# Patient Record
Sex: Female | Born: 1980 | Race: White | Hispanic: No | Marital: Married | Smoking: Former smoker
Health system: Southern US, Community
[De-identification: ages and names within clinical notes are randomized; demographics above are authoritative.]

## PROBLEM LIST (undated history)

## (undated) DIAGNOSIS — R790 Abnormal level of blood mineral: Secondary | ICD-10-CM

## (undated) DIAGNOSIS — R200 Anesthesia of skin: Secondary | ICD-10-CM

## (undated) DIAGNOSIS — K219 Gastro-esophageal reflux disease without esophagitis: Secondary | ICD-10-CM

## (undated) DIAGNOSIS — Z9889 Other specified postprocedural states: Secondary | ICD-10-CM

## (undated) DIAGNOSIS — F419 Anxiety disorder, unspecified: Secondary | ICD-10-CM

## (undated) DIAGNOSIS — K449 Diaphragmatic hernia without obstruction or gangrene: Secondary | ICD-10-CM

## (undated) DIAGNOSIS — R112 Nausea with vomiting, unspecified: Secondary | ICD-10-CM

## (undated) DIAGNOSIS — Z79810 Long term (current) use of selective estrogen receptor modulators (SERMs): Secondary | ICD-10-CM

## (undated) DIAGNOSIS — D249 Benign neoplasm of unspecified breast: Secondary | ICD-10-CM

## (undated) DIAGNOSIS — D649 Anemia, unspecified: Secondary | ICD-10-CM

## (undated) DIAGNOSIS — E538 Deficiency of other specified B group vitamins: Secondary | ICD-10-CM

## (undated) DIAGNOSIS — K8020 Calculus of gallbladder without cholecystitis without obstruction: Secondary | ICD-10-CM

## (undated) DIAGNOSIS — R768 Other specified abnormal immunological findings in serum: Secondary | ICD-10-CM

## (undated) DIAGNOSIS — Z789 Other specified health status: Secondary | ICD-10-CM

## (undated) DIAGNOSIS — K429 Umbilical hernia without obstruction or gangrene: Secondary | ICD-10-CM

## (undated) DIAGNOSIS — D369 Benign neoplasm, unspecified site: Secondary | ICD-10-CM

## (undated) DIAGNOSIS — R202 Paresthesia of skin: Secondary | ICD-10-CM

## (undated) HISTORY — PX: HERNIA REPAIR: SHX51

## (undated) HISTORY — PX: TUBAL LIGATION: SHX77

---
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
## 2012-03-14 HISTORY — PX: HERNIA REPAIR: SHX51

## 2016-03-14 HISTORY — PX: CHOLECYSTECTOMY: SHX55

## 2016-08-02 DIAGNOSIS — K8020 Calculus of gallbladder without cholecystitis without obstruction: Secondary | ICD-10-CM | POA: Insufficient documentation

## 2017-10-06 DIAGNOSIS — N83209 Unspecified ovarian cyst, unspecified side: Secondary | ICD-10-CM | POA: Diagnosis not present

## 2017-10-06 DIAGNOSIS — F329 Major depressive disorder, single episode, unspecified: Secondary | ICD-10-CM | POA: Diagnosis not present

## 2017-10-06 DIAGNOSIS — Z87891 Personal history of nicotine dependence: Secondary | ICD-10-CM | POA: Diagnosis not present

## 2017-10-06 DIAGNOSIS — R1010 Upper abdominal pain, unspecified: Secondary | ICD-10-CM | POA: Diagnosis not present

## 2017-10-06 DIAGNOSIS — Z9049 Acquired absence of other specified parts of digestive tract: Secondary | ICD-10-CM | POA: Diagnosis not present

## 2017-10-06 DIAGNOSIS — R1013 Epigastric pain: Secondary | ICD-10-CM | POA: Diagnosis not present

## 2017-10-10 DIAGNOSIS — R109 Unspecified abdominal pain: Secondary | ICD-10-CM | POA: Diagnosis not present

## 2017-10-10 DIAGNOSIS — Z6824 Body mass index (BMI) 24.0-24.9, adult: Secondary | ICD-10-CM | POA: Diagnosis not present

## 2018-01-07 ENCOUNTER — Other Ambulatory Visit: Payer: Self-pay

## 2018-01-07 ENCOUNTER — Ambulatory Visit
Admission: EM | Admit: 2018-01-07 | Discharge: 2018-01-07 | Disposition: A | Discharge status: Home / Self Care | Payer: 59 | Attending: Emergency Medicine | Admitting: Emergency Medicine

## 2018-01-07 DIAGNOSIS — R5383 Other fatigue: Secondary | ICD-10-CM | POA: Diagnosis not present

## 2018-01-07 DIAGNOSIS — R110 Nausea: Secondary | ICD-10-CM

## 2018-01-07 LAB — CBC WITH DIFFERENTIAL/PLATELET
Abs Immature Granulocytes: 0.01 10*3/uL (ref 0.00–0.07)
BASOS PCT: 1 %
Basophils Absolute: 0 10*3/uL (ref 0.0–0.1)
EOS PCT: 2 %
Eosinophils Absolute: 0.1 10*3/uL (ref 0.0–0.5)
HCT: 37.4 % (ref 36.0–46.0)
Hemoglobin: 12.2 g/dL (ref 12.0–15.0)
Immature Granulocytes: 0 %
LYMPHS PCT: 33 %
Lymphs Abs: 1.7 10*3/uL (ref 0.7–4.0)
MCH: 28.4 pg (ref 26.0–34.0)
MCHC: 32.6 g/dL (ref 30.0–36.0)
MCV: 87 fL (ref 80.0–100.0)
MONO ABS: 0.4 10*3/uL (ref 0.1–1.0)
Monocytes Relative: 8 %
NEUTROS ABS: 3 10*3/uL (ref 1.7–7.7)
Neutrophils Relative %: 56 %
Platelets: 239 10*3/uL (ref 150–400)
RBC: 4.3 MIL/uL (ref 3.87–5.11)
RDW: 13.9 % (ref 11.5–15.5)
WBC: 5.3 10*3/uL (ref 4.0–10.5)
nRBC: 0 % (ref 0.0–0.2)

## 2018-01-07 LAB — COMPREHENSIVE METABOLIC PANEL
ALT: 9 U/L (ref 0–44)
ANION GAP: 10 (ref 5–15)
AST: 15 U/L (ref 15–41)
Albumin: 4.3 g/dL (ref 3.5–5.0)
Alkaline Phosphatase: 47 U/L (ref 38–126)
BUN: 16 mg/dL (ref 6–20)
CO2: 24 mmol/L (ref 22–32)
Calcium: 9.3 mg/dL (ref 8.9–10.3)
Chloride: 108 mmol/L (ref 98–111)
Creatinine, Ser: 0.71 mg/dL (ref 0.44–1.00)
Glucose, Bld: 101 mg/dL — ABNORMAL HIGH (ref 70–99)
POTASSIUM: 4.8 mmol/L (ref 3.5–5.1)
Sodium: 142 mmol/L (ref 135–145)
TOTAL PROTEIN: 7.1 g/dL (ref 6.5–8.1)
Total Bilirubin: 0.2 mg/dL — ABNORMAL LOW (ref 0.3–1.2)

## 2018-01-07 NOTE — Discharge Instructions (Signed)
Your kidney function, electrolyte levels, liver functions were all normal.  Your hemoglobin is about the same as it was when you had it tested back in June 2019.  You are not anemic.  Your platelets are fine.  Your TSH is pending, we will call you if it comes back abnormal.  If you do not hear from [place], then assume that it is normal.  You need to follow-up with a primary care physician as soon as you possibly can to have this further evaluated.  See list below.  There does not appear to be a life-threatening emergency today.   Here is a list of primary care providers who are taking new patients:  Dr. Otilio Miu, Dr. Adline Potter [address] [place] [address] [place] [address]  [phone]  [facility] [address] [place], [place] [address] [phone]  [facility] [employer]  [phone] [place], [place] [address]  Here are clinics/ other resources who will see you if you do not have insurance. Some have certain criteria that you must meet. Call them and find out what they are:  [facility]: [address] [place], [place] [address] Phone: [phone] Hours: First and Third Saturdays of each Month, 9 a.m. - 1 p.m.  [facility]: [address], [place], [place], [place] [address] Phone: [phone] Hours: Tuesday, 4 p.m. - 8 p.m. Thursday, 1 p.m. - 8 p.m. Wednesday, 9 a.m. - [facility] [address], [place], [place] [address] Phone: [phone] Pharmacy Phone Number: [phone] Dental Phone Number: [phone] [place] Help: [phone]  Dental Hours: Monday - Thursday, 8 a.m. - 6 p.m.  [place] [address], [place], [place] [address] Phone: [phone] Pharmacy Phone Number: [phone] [facility] Insurance Help: [phone]  [facility] [place]  [place], [place] [address] Phone: [phone] Pharmacy Phone Number: [phone] [facility] Insurance Help: [phone]  [facility] [address] [place], [place] [address] Phone: [phone] [facility] Insurance Help: [phone]   [place] [place], [place] [address] Phone: [phone]  Go to www.goodrx.com to look up your medications. This will give you a list of where you can find your prescriptions at the most affordable prices. Or ask the pharmacist what the cash price is, or if they have any other discount programs available to help make your medication more affordable. This can be less expensive than what you would pay with insurance.

## 2018-01-07 NOTE — ED Provider Notes (Signed)
HPI  SUBJECTIVE:  Lauren Boyd is a 36 y.o. female who presents with daily, intermittent nausea for the past 10 years.  She is reporting several episodes a day.  She reports 2 months of fatigue, decreased energy, bruising easily.  She reports a 10 pound unintentional weight loss over the past 5 months and states that she is eating normally.  She states that she gets full easily.  Her symptoms are not any different today, but she wants to know "what is going on".  She reports heavier periods than normal over the past year.  Reports shortness of breath with exertion.  No vomiting, fevers, night sweats, epistaxis, hematuria.  No melena.  No coughing, wheezing.  No change in her stool habits.  No abdominal pain.  No history of IV drug use, blood transfusion, transplants.  She is a monogamous sexual relationship with her wife.  There are no aggravating or alleviating factors.  She has not tried anything for this.  She is a former smoker, she is status post cholecystectomy.  No history of anemia, coagulopathy, cancer, hyper or hypothyroidism.  Family history significant for maternal grandmother with colon, breast cancer, maternal grandfather with throat cancer.  LMP: Now.  Denies possibility of being pregnant.  PMD: None.  States that she just moved here.  History reviewed. No pertinent past medical history.  Past Surgical History:  Procedure Laterality Date  . HERNIA REPAIR    . TUBAL LIGATION      Family History  Problem Relation Age of Onset  . Healthy Mother   . Healthy Father     Social History   Tobacco Use  . Smoking status: Former Smoker    Types: Cigarettes  . Smokeless tobacco: Never Used  Substance Use Topics  . Alcohol use: Not Currently  . Drug use: Not Currently    No current facility-administered medications for this encounter.  No current outpatient medications on file.  No Known Allergies   ROS  As noted in HPI.   Physical Exam  BP 133/84 (BP Location: Left  Arm)   Pulse 62   Temp 98.2 F (36.8 C) (Oral)   Resp 16   Ht 5\' 4"  (1.626 m)   Wt 59.9 kg   LMP 12/28/2017   SpO2 100%   BMI 22.66 kg/m   Constitutional: Well developed, well nourished, no acute distress Eyes: PERRL, EOMI, conjunctiva normal bilaterally HENT: Normocephalic, atraumatic,mucus membranes moist Respiratory: Clear to auscultation bilaterally, no rales, no wheezing, no rhonchi Cardiovascular: Normal rate and rhythm, no murmurs, no gallops, no rubs GI: Soft, nondistended, normal bowel sounds, nontender, no rebound, no guarding.  No masses. Back: no CVAT skin: No rash, skin intact Musculoskeletal: No edema, no tenderness, no deformities Neurologic: Alert & oriented x 3, CN II-XII grossly intact, no motor deficits, sensation grossly intact Psychiatric: Speech and behavior appropriate   ED Course   Medications - No data to display  Orders Placed This Encounter  Procedures  . CBC with Differential    Standing Status:   Standing    Number of Occurrences:   1  . Comprehensive metabolic panel    Standing Status:   Standing    Number of Occurrences:   1   No results found for this or any previous visit (from the past 24 hour(s)). No results found.  ED Clinical Impression  Nausea  Fatigue, unspecified type   ED Assessment/Plan   CBC, CMP, TSH.  Deferring HIV as patient has no risk factors for  this.  Will provide a primary care referral list for further work-up.  No evidence of emergency today.  Labs reviewed.  CMP, CBC normal.  TSH pending.  Plan as above.  Outside labs from [facility] reviewed.  Today's hemoglobin stable from last hemoglobin of 12.5 on 10/06/2017.  No record of TSH found.  10/29 1637- TSH resulted and reviewed. Normal.  No orders of the defined types were placed in this encounter.   *This clinic note was created using Dragon dictation software. Therefore, there may be occasional mistakes despite careful proofreading.  ?   Melynda Ripple, MD 01/09/18 [id number]

## 2018-01-07 NOTE — ED Triage Notes (Addendum)
Pt states she hasn't been feeling well for past several months. Reports intermittent nausea x past 10 years. Becoming more frequent, decreased appetite with 10lb weight loss over past couple months. Feels tired. Feels like she is bruising more easily.  No PCP

## 2018-01-08 LAB — TSH: TSH: 1.144 u[IU]/mL (ref 0.350–4.500)

## 2018-08-20 ENCOUNTER — Encounter: Payer: Self-pay | Admitting: Family Medicine

## 2018-08-20 ENCOUNTER — Ambulatory Visit: Payer: 59 | Admitting: Family Medicine

## 2018-08-20 ENCOUNTER — Other Ambulatory Visit: Payer: Self-pay

## 2018-08-20 VITALS — BP 120/80 | HR 60 | Ht 64.0 in | Wt 142.0 lb

## 2018-08-20 DIAGNOSIS — R1013 Epigastric pain: Secondary | ICD-10-CM | POA: Diagnosis not present

## 2018-08-20 DIAGNOSIS — Z7689 Persons encountering health services in other specified circumstances: Secondary | ICD-10-CM

## 2018-08-20 DIAGNOSIS — N946 Dysmenorrhea, unspecified: Secondary | ICD-10-CM

## 2018-08-20 NOTE — Progress Notes (Signed)
Date:  08/20/2018   Name:  Lauren Boyd   DOB:  Oct 01, 1980   MRN:  [id number]   Chief Complaint: Establish Care (moved here) and Nausea (has a "sick feeling" that comes and goes along with a pain that is at the bottom of the sternum and goes through to her back and across the back)  Patient is a 38 year old female who presents for an establishment of care exam. The patient reports the following problems: episodic nausea/intermitant midepigastric pain/ dysmenorrhea. Health maintenance has been reviewed up the date.   Review of Systems  Constitutional: Positive for fatigue. Negative for chills, fever and unexpected weight change.  HENT: Negative for congestion, ear discharge, ear pain, rhinorrhea, sinus pressure, sneezing and sore throat.   Eyes: Negative for photophobia, pain, discharge, redness and itching.  Respiratory: Negative for cough, shortness of breath, wheezing and stridor.   Gastrointestinal: Positive for abdominal pain. Negative for blood in stool, constipation, diarrhea, nausea and vomiting.  Endocrine: Negative for cold intolerance, heat intolerance, polydipsia, polyphagia and polyuria.  Genitourinary: Positive for menstrual problem. Negative for dysuria, flank pain, frequency, hematuria, pelvic pain, urgency, vaginal bleeding and vaginal discharge.  Musculoskeletal: Negative for arthralgias, back pain and myalgias.  Skin: Negative for rash.  Allergic/Immunologic: Negative for environmental allergies and food allergies.  Neurological: Negative for dizziness, weakness, light-headedness, numbness and headaches.  Hematological: Negative for adenopathy. Does not bruise/bleed easily.  Psychiatric/Behavioral: Negative for dysphoric mood. The patient is not nervous/anxious.     There are no active problems to display for this patient.   No Known Allergies  Past Surgical History:  Procedure Laterality Date  . CHOLECYSTECTOMY  2018  . HERNIA REPAIR    . TUBAL LIGATION      Social History   Tobacco Use  . Smoking status: Former Smoker    Types: Cigarettes  . Smokeless tobacco: Never Used  Substance Use Topics  . Alcohol use: Yes    Comment: occasional  . Drug use: Not Currently     Medication list has been reviewed and updated.  Current Meds  Medication Sig  . ibuprofen (ADVIL) 200 MG tablet Take 200 mg by mouth every 6 (six) hours as needed.    PHQ 2/9 Scores 08/20/2018  PHQ - 2 Score 0  PHQ- 9 Score 0    BP Readings from Last 3 Encounters:  08/20/18 120/80  01/07/18 133/84    Physical Exam Vitals signs and nursing note reviewed.  Constitutional:      Appearance: She is well-developed and normal weight.  HENT:     Head: Normocephalic.     Right Ear: Tympanic membrane, ear canal and external ear normal.     Left Ear: Tympanic membrane, ear canal and external ear normal.     Nose: Nose normal.  Eyes:     General: Lids are everted, no foreign bodies appreciated. No scleral icterus.       Left eye: No foreign body or hordeolum.     Conjunctiva/sclera: Conjunctivae normal.     Right eye: Right conjunctiva is not injected.     Left eye: Left conjunctiva is not injected.     Pupils: Pupils are equal, round, and reactive to light.  Neck:     Musculoskeletal: Normal range of motion and neck supple.     Thyroid: No thyromegaly.     Vascular: No JVD.     Trachea: No tracheal deviation.  Cardiovascular:     Rate and Rhythm: Normal rate  and regular rhythm.     Heart sounds: Normal heart sounds. No murmur. No friction rub. No gallop.   Pulmonary:     Effort: Pulmonary effort is normal. No respiratory distress.     Breath sounds: Normal breath sounds. No wheezing or rales.  Abdominal:     General: Bowel sounds are normal.     Palpations: Abdomen is soft. There is no mass.     Tenderness: There is no abdominal tenderness. There is no guarding or rebound.  Musculoskeletal: Normal range of motion.        General: No tenderness.   Lymphadenopathy:     Cervical: No cervical adenopathy.  Skin:    General: Skin is warm.     Findings: No rash.  Neurological:     Mental Status: She is alert and oriented to person, place, and time.     Cranial Nerves: No cranial nerve deficit.     Deep Tendon Reflexes: Reflexes normal.  Psychiatric:        Mood and Affect: Mood is not anxious or depressed.     Wt Readings from Last 3 Encounters:  08/20/18 142 lb (64.4 kg)  01/07/18 132 lb (59.9 kg)    BP 120/80   Pulse 60   Ht 5\' 4"  (1.626 m)   Wt 142 lb (64.4 kg)   LMP 07/27/2018 (Approximate)   BMI 24.37 kg/m   Assessment and Plan:  1. Establishing care with new doctor, encounter for Patient's previous records were all reviewed for previous visits encounters, labs, CT scan in 2019, and clinic notes from [facility].  Review of systems was essentially negative septa is noted below.  Patient will be established with GYN because of previous history of pelvic inflammatory disease and possible ruptured cyst that was noted in the past.  2. Midepigastric pain And has had off-and-on nausea for almost 10 years but the midepigastric pain was not relieved when she had cholecystectomy.  She continues to have episodic mid abdominal pain that radiates to the back it is associated with nausea.  Reviewed previous labs and CT results.  Will check hepatic function panel, lipase, and H. pylori.  Next step to consider if this is to continued and patient will notify us will be referral to strain neurology. - Hepatic Function Panel (6) - Lipase - H. pylori antibody, IgG  3. Dysmenorrhea Patient has had episodes of heavy periods with significant clotting and bleeding.  The patient is reluctant to go on OCPs we discussed this and she is going to give further consideration future evaluation.  Will refer to [facility] for further evaluation and and patient is due for routine pathology.  As with patient possibility of having a change HPV testing on  next exam. - Ambulatory referral to Obstetrics / Gynecology

## 2018-08-21 ENCOUNTER — Ambulatory Visit (INDEPENDENT_AMBULATORY_CARE_PROVIDER_SITE_OTHER): Payer: 59

## 2018-08-21 DIAGNOSIS — R1013 Epigastric pain: Secondary | ICD-10-CM | POA: Diagnosis not present

## 2018-08-21 LAB — HEPATIC FUNCTION PANEL (6)
ALT: 11 IU/L (ref 0–32)
AST: 15 IU/L (ref 0–40)
Albumin: 4.5 g/dL (ref 3.8–4.8)
Alkaline Phosphatase: 58 IU/L (ref 39–117)
Bilirubin Total: 0.3 mg/dL (ref 0.0–1.2)
Bilirubin, Direct: 0.1 mg/dL (ref 0.00–0.40)

## 2018-08-21 LAB — HEMOCCULT GUIAC POC 1CARD (OFFICE)
Card #2 Fecal Occult Blod, POC: NEGATIVE
Card #3 Fecal Occult Blood, POC: NEGATIVE
Fecal Occult Blood, POC: NEGATIVE

## 2018-08-21 LAB — LIPASE: Lipase: 40 U/L (ref 14–72)

## 2018-08-21 LAB — H. PYLORI ANTIBODY, IGG: H. pylori, IgG AbS: 0.24 Index Value (ref 0.00–0.79)

## 2018-08-22 ENCOUNTER — Other Ambulatory Visit: Payer: Self-pay

## 2018-08-22 DIAGNOSIS — R1013 Epigastric pain: Secondary | ICD-10-CM

## 2018-08-22 NOTE — Progress Notes (Unsigned)
Ref to gastro placed

## 2018-08-29 ENCOUNTER — Other Ambulatory Visit: Payer: Self-pay

## 2018-08-29 ENCOUNTER — Encounter: Payer: Self-pay | Admitting: Obstetrics and Gynecology

## 2018-08-29 ENCOUNTER — Ambulatory Visit: Payer: 59 | Admitting: Obstetrics and Gynecology

## 2018-08-29 ENCOUNTER — Other Ambulatory Visit (HOSPITAL_COMMUNITY)
Admission: RE | Admit: 2018-08-29 | Discharge: 2018-08-29 | Disposition: A | Discharge status: Home / Self Care | Payer: 59 | Source: Ambulatory Visit | Attending: Obstetrics and Gynecology | Admitting: Obstetrics and Gynecology

## 2018-08-29 VITALS — BP 125/75 | HR 76 | Ht 64.0 in | Wt 142.0 lb

## 2018-08-29 DIAGNOSIS — Z124 Encounter for screening for malignant neoplasm of cervix: Secondary | ICD-10-CM | POA: Diagnosis present

## 2018-08-29 DIAGNOSIS — Z01419 Encounter for gynecological examination (general) (routine) without abnormal findings: Secondary | ICD-10-CM

## 2018-08-29 DIAGNOSIS — Z1331 Encounter for screening for depression: Secondary | ICD-10-CM

## 2018-08-29 DIAGNOSIS — Z1339 Encounter for screening examination for other mental health and behavioral disorders: Secondary | ICD-10-CM

## 2018-08-29 NOTE — Progress Notes (Signed)
Obstetrics & Gynecology Office Visit   Chief Complaint:  Chief Complaint  Patient presents with  . Dysmenorrhea    Heavy bleeding with clots Referrd by Otilio Miu MD  Referral by Dr. Otilio Miu, MD, of [facility] for heavy menstrual bleeding and an annual physical examination.   History of Present Illness:  Ms. Danahi Reddish is a 38 y.o. [phone] (she had a daughter who passed from an accident) who LMP was Patient's last menstrual period was 08/23/2018 (exact date)., presents today for her annual examination.  Her menses are regular every 28-30 days, lasting 4-7 day(s).  Dysmenorrhea none. She does not have intermenstrual bleeding.  She passes clots the first few days of her periods.  This has been going on for a few years.  She is sexually active with her same-sex wife.  Last Pap: 03/2015  Results were: no abnormalities /neg HPV DNA negative (per patient report) Hx of STDs: none  There is a FH of breast cancer in her maternal grandmother. There is no FH of ovarian cancer. The patient does not do self-breast exams.  Tobacco use: former smoker Alcohol use: social drinker Exercise: moderately active  The patient wears seatbelts: yes.   The patient reports that domestic violence in her life is absent.   Past Medical History:  Diagnosis Date  . No known health problems     Past Surgical History:  Procedure Laterality Date  . CHOLECYSTECTOMY  2018  . HERNIA REPAIR    . TUBAL LIGATION      Prior to Admission medications   Medication Sig Start Date End Date Taking? Authorizing Provider  ibuprofen (ADVIL) 200 MG tablet Take 200 mg by mouth every 6 (six) hours as needed.   Yes [provider]   Allergies: No Known Allergies  Obstetric History: [id number]  Social History   Socioeconomic History  . Marital status: Married    Spouse name: Not on file  . Number of children: Not on file  . Years of education: Not on file  . Highest education level: Not on  file  Occupational History  . Not on file  Social Needs  . Financial resource strain: Not on file  . Food insecurity    Worry: Not on file    Inability: Not on file  . Transportation needs    Medical: Not on file    Non-medical: Not on file  Tobacco Use  . Smoking status: Former Smoker    Types: Cigarettes  . Smokeless tobacco: Never Used  Substance and Sexual Activity  . Alcohol use: Yes    Comment: occasional  . Drug use: Not Currently  . Sexual activity: Yes    Birth control/protection: None, Surgical    Comment: Tubal ligation  Lifestyle  . Physical activity    Days per week: Not on file    Minutes per session: Not on file  . Stress: Not on file  Relationships  . Social Herbalist on phone: Not on file    Gets together: Not on file    Attends religious service: Not on file    Active member of club or organization: Not on file    Attends meetings of clubs or organizations: Not on file    Relationship status: Not on file  . Intimate partner violence    Fear of current or ex partner: Not on file    Emotionally abused: Not on file    Physically abused: Not on file  Forced sexual activity: Not on file  Other Topics Concern  . Not on file  Social History Narrative  . Not on file    Family History  Problem Relation Age of Onset  . Healthy Mother   . Healthy Father   . Breast cancer Maternal Grandmother 70    Review of Systems  Constitutional: Negative.   HENT: Negative.   Eyes: Negative.   Respiratory: Negative.   Cardiovascular: Negative.   Gastrointestinal: Negative.   Genitourinary: Negative.   Musculoskeletal: Negative.   Skin: Negative.   Neurological: Negative.   Psychiatric/Behavioral: Negative.      Physical Exam BP 125/75 (BP Location: Left Arm, Patient Position: Sitting, Cuff Size: Normal)   Pulse 76   Ht 5\' 4"  (1.626 m)   Wt 142 lb (64.4 kg)   LMP 08/23/2018 (Exact Date)   BMI 24.37 kg/m    Physical Exam Constitutional:       General: She is not in acute distress.    Appearance: Normal appearance. She is well-developed.  Genitourinary:     Pelvic exam was performed with patient supine.     Vulva, urethra, bladder and uterus normal.     No inguinal adenopathy present in the right or left side.    No signs of injury in the vagina.     No vaginal discharge, erythema, tenderness or bleeding.     No cervical motion tenderness, discharge, lesion or polyp.     Uterus is mobile.     Uterus is not enlarged or tender.     No uterine mass detected.    Uterus is anteverted.     No right or left adnexal mass present.     Right adnexa not tender or full.     Left adnexa not tender or full.  HENT:     Head: Normocephalic and atraumatic.  Eyes:     General: No scleral icterus.    Conjunctiva/sclera: Conjunctivae normal.  Neck:     Musculoskeletal: Normal range of motion and neck supple.     Thyroid: No thyromegaly.  Cardiovascular:     Rate and Rhythm: Normal rate and regular rhythm.     Heart sounds: No murmur. No friction rub. No gallop.   Pulmonary:     Effort: Pulmonary effort is normal. No respiratory distress.     Breath sounds: Normal breath sounds. No wheezing or rales.  Chest:     Breasts:        Right: No inverted nipple, mass, nipple discharge, skin change or tenderness.        Left: No inverted nipple, mass, nipple discharge, skin change or tenderness.  Abdominal:     General: Bowel sounds are normal. There is no distension.     Palpations: Abdomen is soft. There is no mass.     Tenderness: There is no abdominal tenderness. There is no guarding or rebound.  Musculoskeletal: Normal range of motion.        General: No swelling or tenderness.  Lymphadenopathy:     Cervical: No cervical adenopathy.     Lower Body: No right inguinal adenopathy. No left inguinal adenopathy.  Neurological:     General: No focal deficit present.     Mental Status: She is alert and oriented to person, place, and time.      Cranial Nerves: No cranial nerve deficit.  Skin:    General: Skin is warm and dry.     Findings: No erythema or rash.  Psychiatric:  Mood and Affect: Mood normal.        Behavior: Behavior normal.        Judgment: Judgment normal.     Female chaperone present for pelvic and breast  portions of the physical exam  Results: AUDIT Questionnaire (screen for alcoholism): 2 PHQ-9: 1   Assessment: 38 y.o. G90P0010 female here for routine annual gynecologic examination  Plan: Problem List Items Addressed This Visit    None    Visit Diagnoses    Women's annual routine gynecological examination    -  Primary   Relevant Orders   Cytology - PAP   Screening for depression       Screening for alcoholism       Pap smear for cervical cancer screening       Relevant Orders   Cytology - PAP      Screening: -- Blood pressure screen normal -- Weight screening: normal -- Depression screening negative (PHQ-9) -- Nutrition: normal -- cholesterol screening: not due for screening -- osteoporosis screening: not due -- tobacco screening: not using -- alcohol screening: AUDIT questionnaire indicates low-risk usage. -- family history of breast cancer screening: done. not at high risk. -- no evidence of domestic violence or intimate partner violence. -- STD screening: gonorrhea/chlamydia NAAT not collected per patient request. -- pap smear collected per ASCCP guidelines  States she is OK with her menses at the current time. She does not wish any treatment. She may follow up, PRN.  Follow up for annual exam in one year.   Prentice Docker, MD 08/29/2018 10:23 AM    CC: Juline Patch, MD [address] [place],  [place] [address]

## 2018-08-30 LAB — CYTOLOGY - PAP
Diagnosis: NEGATIVE
HPV: NOT DETECTED

## 2018-09-12 ENCOUNTER — Other Ambulatory Visit: Payer: Self-pay

## 2018-09-12 ENCOUNTER — Ambulatory Visit (INDEPENDENT_AMBULATORY_CARE_PROVIDER_SITE_OTHER): Payer: 59 | Admitting: Gastroenterology

## 2018-09-12 ENCOUNTER — Encounter: Payer: Self-pay | Admitting: Gastroenterology

## 2018-09-12 DIAGNOSIS — R1013 Epigastric pain: Secondary | ICD-10-CM | POA: Diagnosis not present

## 2018-09-12 MED ORDER — DEXILANT 60 MG PO CPDR: 60.0000 mg | DELAYED_RELEASE_CAPSULE | Freq: Every day | ORAL | 0 refills | Status: DC

## 2018-09-12 NOTE — Progress Notes (Signed)
Lauren Lame, MD [address]  [place]  [place], [place] [address]  Main: [phone]  Fax: [phone]    Gastroenterology Virtual/Video Visit  Referring Provider:     Juline Patch, MD Primary Care Physician:  Juline Patch, MD Primary Gastroenterologist:  Dr.Stanly Si Allen Norris Reason for Consultation:     Dyspepsia        HPI:    Virtual Visit via Video Note [place] Location of provider: Office  Participating persons: The patient myself and Ginger Feldpausch.  I connected with Alecia Lemming on 09/12/18 at  9:30 AM EDT by a video enabled telemedicine application and verified that I am speaking with the correct person using two identifiers.   I discussed the limitations of evaluation and management by telemedicine and the availability of in person appointments. The patient expressed understanding and agreed to proceed.  Verbal consent to proceed obtained.  History of Present Illness: Lauren Boyd is a 38 y.o. female referred by Dr. Juline Patch, MD  for consultation & management of Pepcid.  The patient reports that she has had waves of nausea.  The patient reports that she has had waves of nausea.  The patient reports that there is no association with eating drinking or bowel movements.  She also reports that the pain does not wake her up when she is sound asleep.  The patient also denies any unexplained weight loss fevers chills or vomiting.  She states that when she gets the pain the symptoms lasts for a few minutes and will also get better if she lays down.  There is no family history of colon cancer or colon polyps in any first-degree relatives although she does report she had a grandparent with colon cancer.  She reports that the dyspepsia manifests itself as a nausea feeling that comes in waves.  There is no dizziness or lightheadedness associated with the symptoms.  Past Medical History:  Diagnosis Date  . No known  health problems     Past Surgical History:  Procedure Laterality Date  . CHOLECYSTECTOMY  2018  . HERNIA REPAIR    . TUBAL LIGATION      Prior to Admission medications   Medication Sig Start Date End Date Taking? Authorizing Provider  ibuprofen (ADVIL) 200 MG tablet Take 200 mg by mouth every 6 (six) hours as needed.    [provider]    Family History  Problem Relation Age of Onset  . Healthy Mother   . Healthy Father   . Breast cancer Maternal Grandmother 70     Social History   Tobacco Use  . Smoking status: Former Smoker    Types: Cigarettes  . Smokeless tobacco: Never Used  Substance Use Topics  . Alcohol use: Yes    Comment: occasional  . Drug use: Not Currently    Allergies as of 09/12/2018  . (No Known Allergies)    Review of Systems:    All systems reviewed and negative except where noted in HPI.   Observations/Objective:  Labs: CBC    Component Value Date/Time   WBC 5.3 01/07/2018 1214   RBC 4.30 01/07/2018 1214   HGB 12.2 01/07/2018 1214   HCT 37.4 01/07/2018 1214   PLT 239 01/07/2018 1214   MCV 87.0 01/07/2018 1214   MCH 28.4 01/07/2018 1214   MCHC 32.6 01/07/2018 1214   RDW 13.9 01/07/2018 1214   LYMPHSABS 1.7 01/07/2018 1214   MONOABS 0.4  01/07/2018 1214   EOSABS 0.1 01/07/2018 1214   BASOSABS 0.0 01/07/2018 1214   CMP     Component Value Date/Time   NA 142 01/07/2018 1214   K 4.8 01/07/2018 1214   CL 108 01/07/2018 1214   CO2 24 01/07/2018 1214   GLUCOSE 101 (H) 01/07/2018 1214   BUN 16 01/07/2018 1214   CREATININE 0.71 01/07/2018 1214   CALCIUM 9.3 01/07/2018 1214   PROT 7.1 01/07/2018 1214   ALBUMIN 4.5 08/20/2018 1013   AST 15 08/20/2018 1013   ALT 11 08/20/2018 1013   ALKPHOS 58 08/20/2018 1013   BILITOT 0.3 08/20/2018 1013   GFRNONAA >60 01/07/2018 1214   GFRAA >60 01/07/2018 1214    Imaging Studies: No results found.  Assessment and Plan:   Lauren Boyd is a 38 y.o. y/o female has been referred  for dyspepsia.  The patient reports that this is a nausea-like feeling that comes in waves and lasts a short amount of time until she usually lays down which makes it go away.  The patient has been told the causes of nausea such as reflux and peptic ulcer disease.  The patient has already had her gallbladder out in the past.  The patient also denies NSAID use.  The patient will be started on a trial of Dexilant for 1 month to see if this helps her symptoms if not then she will come into the office and be reevaluated for other possible causes of her symptoms.  The patient has been explained the plan and agrees with it.  Follow Up Instructions:  I discussed the assessment and treatment plan with the patient. The patient was provided an opportunity to ask questions and all were answered. The patient agreed with the plan and demonstrated an understanding of the instructions.   The patient was advised to call back or seek an in-person evaluation if the symptoms worsen or if the condition fails to improve as anticipated.  I provided 20 minutes of non-face-to-face time during this encounter.   Lauren Lame, MD  Speech recognition software was used to dictate the above note.

## 2018-11-29 ENCOUNTER — Encounter: Payer: Self-pay | Admitting: Family Medicine

## 2018-11-30 ENCOUNTER — Other Ambulatory Visit: Payer: Self-pay

## 2018-11-30 ENCOUNTER — Encounter: Payer: Self-pay | Admitting: Family Medicine

## 2018-11-30 ENCOUNTER — Ambulatory Visit: Payer: 59 | Admitting: Family Medicine

## 2018-11-30 VITALS — BP 120/70 | HR 76 | Ht 64.0 in | Wt 144.0 lb

## 2018-11-30 DIAGNOSIS — G459 Transient cerebral ischemic attack, unspecified: Secondary | ICD-10-CM | POA: Diagnosis not present

## 2018-11-30 DIAGNOSIS — K118 Other diseases of salivary glands: Secondary | ICD-10-CM | POA: Diagnosis not present

## 2018-11-30 NOTE — Progress Notes (Addendum)
Date:  11/30/2018   Name:  Lauren Boyd   DOB:  [birth year]   MRN:  [id number]   Chief Complaint: raw mouth (feels raw on inside of mouth and tingling and numbness on L) side of face- hasn't noticed any drooping, but couldn't get words out on phone yesterday)  Neurologic Problem The patient's primary symptoms include an altered mental status and focal sensory loss. The patient's pertinent negatives include no clumsiness, focal weakness, loss of balance, memory loss, near-syncope, slurred speech, syncope, visual change or weakness. This is a new problem. The current episode started yesterday. The neurological problem developed suddenly. Progression since onset: coming and going. There was left-sided and facial focality noted. Pertinent negatives include no abdominal pain, auditory change, aura, back pain, bladder incontinence, bowel incontinence, chest pain, confusion, diaphoresis, dizziness, fatigue, fever, headaches, light-headedness, nausea, neck pain, palpitations, shortness of breath, vertigo or vomiting. (Numbness for an hour. 2 episodes this am duration 1 hour.) There is no history of a clotting disorder or a CVA.    Review of Systems  Constitutional: Negative.  Negative for chills, diaphoresis, fatigue, fever and unexpected weight change.  HENT: Negative for congestion, drooling, ear discharge, ear pain, rhinorrhea, sinus pressure, sneezing and sore throat.   Eyes: Negative for photophobia, pain, discharge, redness and itching.  Respiratory: Negative for cough, shortness of breath, wheezing and stridor.   Cardiovascular: Negative for chest pain, palpitations, leg swelling and near-syncope.  Gastrointestinal: Negative for abdominal pain, blood in stool, bowel incontinence, constipation, diarrhea, nausea and vomiting.  Endocrine: Negative for cold intolerance, heat intolerance, polydipsia, polyphagia and polyuria.  Genitourinary: Negative for bladder incontinence, dysuria, flank pain,  frequency, hematuria, menstrual problem, pelvic pain, urgency, vaginal bleeding and vaginal discharge.  Musculoskeletal: Negative for arthralgias, back pain, myalgias and neck pain.  Skin: Negative for rash.  Allergic/Immunologic: Negative for environmental allergies and food allergies.  Neurological: Negative for dizziness, vertigo, focal weakness, syncope, weakness, light-headedness, numbness, headaches and loss of balance.  Hematological: Negative for adenopathy. Does not bruise/bleed easily.  Psychiatric/Behavioral: Negative for confusion, dysphoric mood, memory loss and suicidal ideas. The patient is not nervous/anxious.     There are no active problems to display for this patient.   No Known Allergies  Past Surgical History:  Procedure Laterality Date  . CHOLECYSTECTOMY  2018  . HERNIA REPAIR    . TUBAL LIGATION      Social History   Tobacco Use  . Smoking status: Former Smoker    Types: Cigarettes  . Smokeless tobacco: Never Used  Substance Use Topics  . Alcohol use: Yes    Comment: occasional  . Drug use: Not Currently     Medication list has been reviewed and updated.  Current Meds  Medication Sig  . ibuprofen (ADVIL) 200 MG tablet Take 200 mg by mouth every 6 (six) hours as needed.    PHQ 2/9 Scores 08/29/2018 08/20/2018  PHQ - 2 Score 0 0  PHQ- 9 Score 0 0    BP Readings from Last 3 Encounters:  11/30/18 120/70  08/29/18 125/75  08/20/18 120/80    Physical Exam Vitals signs and nursing note reviewed.  Constitutional:      Appearance: Normal appearance. She is well-developed.  HENT:     Head: Normocephalic.     Right Ear: Tympanic membrane, ear canal and external ear normal.     Left Ear: Tympanic membrane, ear canal and external ear normal.     Nose: Nose normal.  Eyes:  General: Lids are everted, no foreign bodies appreciated. No visual field deficit or scleral icterus.       Left eye: No foreign body or hordeolum.     Conjunctiva/sclera:  Conjunctivae normal.     Right eye: Right conjunctiva is not injected.     Left eye: Left conjunctiva is not injected.     Pupils: Pupils are equal, round, and reactive to light.  Neck:     Musculoskeletal: Normal range of motion and neck supple.     Thyroid: No thyromegaly.     Vascular: No JVD.     Trachea: No tracheal deviation.  Cardiovascular:     Rate and Rhythm: Normal rate and regular rhythm.     Heart sounds: Normal heart sounds. No murmur. No friction rub. No gallop.   Pulmonary:     Effort: Pulmonary effort is normal. No respiratory distress.     Breath sounds: Normal breath sounds. No wheezing or rales.  Abdominal:     General: Bowel sounds are normal.     Palpations: Abdomen is soft. There is no mass.     Tenderness: There is no abdominal tenderness. There is no guarding or rebound.  Musculoskeletal: Normal range of motion.        General: No tenderness.  Lymphadenopathy:     Cervical: No cervical adenopathy.  Skin:    General: Skin is warm.     Findings: No rash.  Neurological:     Mental Status: She is alert and oriented to person, place, and time.     Cranial Nerves: Cranial nerves are intact. No cranial nerve deficit, dysarthria or facial asymmetry.     Sensory: Sensation is intact.     Motor: Motor function is intact.     Coordination: Romberg sign negative.     Deep Tendon Reflexes: Reflexes normal.     Reflex Scores:      Tricep reflexes are 2+ on the right side and 2+ on the left side.      Bicep reflexes are 2+ on the right side and 2+ on the left side.      Brachioradialis reflexes are 2+ on the right side and 2+ on the left side.      Patellar reflexes are 2+ on the right side and 2+ on the left side.      Achilles reflexes are 2+ on the right side and 2+ on the left side. Psychiatric:        Mood and Affect: Mood is not anxious or depressed.     Wt Readings from Last 3 Encounters:  11/30/18 144 lb (65.3 kg)  08/29/18 142 lb (64.4 kg)  08/20/18  142 lb (64.4 kg)    BP 120/70   Pulse 76   Ht 5\' 4"  (1.626 m)   Wt 144 lb (65.3 kg)   BMI 24.72 kg/m   Assessment and Plan:  1. TIA (transient ischemic attack) Patient is having symptoms that are suggestive of a TIA although they are very transient they last about an hour and has not been residual.  I am not sure if the difficulty with texting was that she was hitting the wrong keys or something of that nature.  And the paresthesia of the facial area is suggestive but that was short-lived as well patient has been instructed to maybe start a baby aspirin for the next couple days we are checking her lipid and her glucose today.  And she has been instructed to go to the  ER if there should be any return of the symptoms. - Lipid panel - Renal Function Panel  2. Stenosis of salivary duct Patient sounds like she is having a little irritation of the salivary gland underneath the tongue.  There is no erythema but there is some swelling at the opening I do not see a stone patient has been instructed to use a sour candy or gum this stimulate the salivation.

## 2018-11-30 NOTE — Patient Instructions (Addendum)
Angioedema Angioedema is the sudden swelling of tissue in the body. Angioedema can affect any part of the body, but it most often affects the deeper parts of the skin, causing red, itchy patches (hives) to appear over the affected area. It often begins during the night and is found in the morning. Depending on the cause, angioedema may happen:  Only once.  Several times. It may come back in unpredictable patterns.  Repeatedly for several years. Over time, it may gradually stop coming back. Angioedema can be life-threatening if it affects the air passages that you breathe through. What are the causes? This condition may be caused by:  Foods, such as milk, eggs, shellfish, wheat, or nuts.  Certain medicines, such as ACE inhibitors, antibiotics, nonsteroidal anti-inflammatory drugs, birth control pills, or dyes used in X-rays.  Insect stings.  Infections. Angioedema can be inherited, and episodes can be triggered by:  Mild injury.  Dental work.  Surgery.  Stress.  Sudden changes in temperature.  Exercise. In some cases, the cause of this condition is not known. What are the signs or symptoms? Symptoms of this condition depend on where the swelling happens. Symptoms may include:  Swollen skin.  Red, itchy patches of skin (hives).  Redness in the affected area.  Pain in the affected area.  Swollen lips or tongue.  Wheezing.  Breathing problems.  If your internal organs are involved, symptoms may also include:  Nausea.  Abdominal pain.  Vomiting.  Difficulty swallowing.  Difficulty passing urine. How is this diagnosed? This condition may be diagnosed based on:  An exam of the affected area.  Your medical history.  Whether anyone in your family has had this condition before.  A review of any medicines you have been taking.  Tests, including: ? Allergy skin tests to see if the condition was caused by an allergic reaction. ? Blood tests to see if the  condition was caused by a gene. ? Tests to check for underlying diseases that could cause the condition. How is this treated? Treatment for this condition depends on the cause. It may involve any of the following:  If something triggered the condition, making changes to keep it from triggering the condition again.  If the condition affects your breathing, having tubes placed in your airway to keep it open.  Taking medicines to treat symptoms or prevent future episodes. These may include: ? Antihistamines. ? Epinephrine injections. ? Steroids. If your condition is severe, you may need to be treated at the hospital. Angioedema usually gets better in 24-48 hours. Follow these instructions at home:  Take over-the-counter and prescription medicines only as told by your health care provider.  If you were given medicines for emergency allergy treatment, always carry them with you.  Wear a medical bracelet as told by your health care provider.  If something triggers your condition, avoid the trigger, if possible.  If your condition is inherited and you are thinking about having children, talk to your health care provider. It is important to discuss the risks of passing on the condition to your children. Contact a health care provider if:  You have repeated episodes of angioedema.  Episodes of angioedema start to happen more often than they used to, even after you take steps to prevent them.  You have episodes of angioedema that are more severe than they have been before, even after you take steps to prevent them.  You are thinking about having children. Get help right away if:  You   have severe swelling of your mouth, tongue, or lips.  You have trouble breathing.  You have trouble swallowing.  You faint. This information is not intended to replace advice given to you by your health care provider. Make sure you discuss any questions you have with your health care provider. Document  Released: 05/09/2001 Document Revised: 02/10/2017 Document Reviewed: 09/08/2015 Elsevier Patient Education  2020 [employer].  Transient Ischemic Attack  A transient ischemic attack (TIA) is a "warning stroke" that causes stroke-like symptoms that go away quickly. A TIA does not cause lasting damage to the brain. But having a TIA is a sign that you may be at risk for a stroke. Lifestyle changes and medical treatments can help prevent a stroke. It is important to know the symptoms of a TIA and what to do. Get help right away, even if your symptoms go away. The symptoms of a TIA are the same as those of a stroke. They can happen fast, and they usually go away within minutes or hours. They can include:  Weakness or loss of feeling in your face, arm, or leg. This often happens on one side of your body.  Trouble walking.  Trouble moving your arms or legs.  Trouble talking or understanding what people are saying.  Trouble seeing.  Seeing two of one object (double vision).  Feeling dizzy.  Feeling confused.  Loss of balance or coordination.  Feeling sick to your stomach (nauseous) and throwing up (vomiting).  A very bad headache for no reason. What increases the risk? Certain things may make you more likely to have a TIA. Some of these are things that you can change, such as:  Being very overweight (obese).  Using products that contain nicotine or tobacco, such as cigarettes and e-cigarettes.  Taking birth control pills.  Not being active.  Drinking too much alcohol.  Using drugs. Other risk factors include:  Having an irregular heartbeat (atrial fibrillation).  Being African American or Hispanic.  Having had blood clots, stroke, TIA, or heart attack in the past.  Being a woman with a history of high blood pressure in pregnancy (preeclampsia).  Being over the age of 1.  Being female.  Having family history of stroke.  Having the following diseases or conditions:  ? High blood pressure. ? High cholesterol. ? Diabetes. ? Heart disease. ? Sickle cell disease. ? Sleep apnea. ? Migraine headache. ? Long-term (chronic) diseases that cause soreness and swelling (inflammation). ? Disorders that affect how your blood clots. Follow these instructions at home: Medicines   Take over-the-counter and prescription medicines only as told by your doctor.  If you were told to take aspirin or another medicine to thin your blood, take it exactly as told by your doctor. ? Taking too much of the medicine can cause bleeding. ? Taking too little of the medicine may not work to treat the problem. Eating and drinking   Eat 5 or more servings of fruits and vegetables each day.  Follow instructions from your doctor about your diet. You may need to follow a certain diet to help lower your risk of having a stroke. You may need to: ? Eat a diet that is low in fat and salt. ? Eat foods that contain a lot of fiber. ? Limit the amount of carbohydrates and sugar in your diet.  Limit alcohol intake to 1 drink a day for nonpregnant women and 2 drinks a day for men. One drink equals 12 oz of beer, 5  oz of wine, or 1 oz of hard liquor. General instructions  Keep a healthy weight.  Stay active. Try to get at least 30 minutes of activity on all or most days.  Find out if you have a condition called sleep apnea. Get treatment if needed.  Do not use any products that contain nicotine or tobacco, such as cigarettes and e-cigarettes. If you need help quitting, ask your doctor.  Do not abuse drugs.  Keep all follow-up visits as told by your doctor. This is important. Get help right away if:  You have any signs of stroke. "BE FAST" is an easy way to remember the main warning signs: ? B - Balance. Signs are dizziness, sudden trouble walking, or loss of balance. ? E - Eyes. Signs are trouble seeing or a sudden change in how you see. ? F - Face. Signs are sudden weakness or  loss of feeling of the face, or the face or eyelid drooping on one side. ? A - Arms. Signs are weakness or loss of feeling in an arm. This happens suddenly and usually on one side of the body. ? S - Speech. Signs are sudden trouble speaking, slurred speech, or trouble understanding what people say. ? T - Time. Time to call emergency services. Write down what time symptoms started.  You have other signs of stroke, such as: ? A sudden, very bad headache with no known cause. ? Feeling sick to your stomach (nausea). ? Throwing up (vomiting). ? Jerky movements that you cannot control (seizure). These symptoms may be an emergency. Do not wait to see if the symptoms will go away. Get medical help right away. Call your local emergency services (911 in the [place] Do not drive yourself to the hospital. Summary  A transient ischemic attack (TIA) is a "warning stroke" that causes stroke-like symptoms that go away quickly.  A TIA is a medical emergency. Get help right away, even if your symptoms go away.  A TIA does not cause lasting damage to the brain.  Having a TIA is a sign that you may be at risk for a stroke. Lifestyle changes and medical treatments can help prevent a stroke. This information is not intended to replace advice given to you by your health care provider. Make sure you discuss any questions you have with your health care provider. Document Released: 12/08/2007 Document Revised: 11/24/2017 Document Reviewed: 06/01/2016 Elsevier Patient Education  2020 [employer].

## 2018-12-01 ENCOUNTER — Encounter: Payer: Self-pay | Admitting: Family Medicine

## 2018-12-01 LAB — LIPID PANEL
Chol/HDL Ratio: 2 ratio (ref 0.0–4.4)
Cholesterol, Total: 142 mg/dL (ref 100–199)
HDL: 72 mg/dL
LDL Chol Calc (NIH): 61 mg/dL (ref 0–99)
Triglycerides: 38 mg/dL (ref 0–149)
VLDL Cholesterol Cal: 9 mg/dL (ref 5–40)

## 2018-12-01 LAB — RENAL FUNCTION PANEL
Albumin: 4.6 g/dL (ref 3.8–4.8)
BUN/Creatinine Ratio: 21 (ref 9–23)
BUN: 17 mg/dL (ref 6–20)
CO2: 22 mmol/L (ref 20–29)
Calcium: 9.5 mg/dL (ref 8.7–10.2)
Chloride: 107 mmol/L — ABNORMAL HIGH (ref 96–106)
Creatinine, Ser: 0.81 mg/dL (ref 0.57–1.00)
GFR calc Af Amer: 107 mL/min/1.73
GFR calc non Af Amer: 92 mL/min/1.73
Glucose: 89 mg/dL (ref 65–99)
Phosphorus: 3.4 mg/dL (ref 3.0–4.3)
Potassium: 4.8 mmol/L (ref 3.5–5.2)
Sodium: 143 mmol/L (ref 134–144)

## 2018-12-02 ENCOUNTER — Encounter: Payer: Self-pay | Admitting: Emergency Medicine

## 2018-12-02 ENCOUNTER — Emergency Department
Admission: EM | Admit: 2018-12-02 | Discharge: 2018-12-02 | Disposition: A | Discharge status: Home / Self Care | Payer: 59 | Attending: Emergency Medicine | Admitting: Emergency Medicine

## 2018-12-02 ENCOUNTER — Emergency Department: Payer: 59

## 2018-12-02 ENCOUNTER — Other Ambulatory Visit: Payer: Self-pay

## 2018-12-02 DIAGNOSIS — Z87891 Personal history of nicotine dependence: Secondary | ICD-10-CM | POA: Insufficient documentation

## 2018-12-02 DIAGNOSIS — R413 Other amnesia: Secondary | ICD-10-CM | POA: Diagnosis not present

## 2018-12-02 DIAGNOSIS — R202 Paresthesia of skin: Secondary | ICD-10-CM | POA: Diagnosis not present

## 2018-12-02 DIAGNOSIS — R200 Anesthesia of skin: Secondary | ICD-10-CM | POA: Diagnosis present

## 2018-12-02 LAB — CBC WITH DIFFERENTIAL/PLATELET
Abs Immature Granulocytes: 0.02 10*3/uL (ref 0.00–0.07)
Basophils Absolute: 0 10*3/uL (ref 0.0–0.1)
Basophils Relative: 1 %
Eosinophils Absolute: 0.2 10*3/uL (ref 0.0–0.5)
Eosinophils Relative: 3 %
HCT: 37.2 % (ref 36.0–46.0)
Hemoglobin: 11.9 g/dL — ABNORMAL LOW (ref 12.0–15.0)
Immature Granulocytes: 0 %
Lymphocytes Relative: 37 %
Lymphs Abs: 2.5 10*3/uL (ref 0.7–4.0)
MCH: 27.3 pg (ref 26.0–34.0)
MCHC: 32 g/dL (ref 30.0–36.0)
MCV: 85.3 fL (ref 80.0–100.0)
Monocytes Absolute: 0.6 10*3/uL (ref 0.1–1.0)
Monocytes Relative: 9 %
Neutro Abs: 3.4 10*3/uL (ref 1.7–7.7)
Neutrophils Relative %: 50 %
Platelets: 264 10*3/uL (ref 150–400)
RBC: 4.36 MIL/uL (ref 3.87–5.11)
RDW: 13.8 % (ref 11.5–15.5)
WBC: 6.7 10*3/uL (ref 4.0–10.5)
nRBC: 0 % (ref 0.0–0.2)

## 2018-12-02 LAB — COMPREHENSIVE METABOLIC PANEL
ALT: 16 U/L (ref 0–44)
AST: 17 U/L (ref 15–41)
Albumin: 4.2 g/dL (ref 3.5–5.0)
Alkaline Phosphatase: 52 U/L (ref 38–126)
Anion gap: 7 (ref 5–15)
BUN: 25 mg/dL — ABNORMAL HIGH (ref 6–20)
CO2: 24 mmol/L (ref 22–32)
Calcium: 9.3 mg/dL (ref 8.9–10.3)
Chloride: 109 mmol/L (ref 98–111)
Creatinine, Ser: 0.83 mg/dL (ref 0.44–1.00)
GFR calc Af Amer: >60 mL/min (ref >60)
GFR calc non Af Amer: >60 mL/min (ref >60)
Glucose, Bld: 101 mg/dL — ABNORMAL HIGH (ref 70–99)
Potassium: 4 mmol/L (ref 3.5–5.1)
Sodium: 140 mmol/L (ref 135–145)
Total Bilirubin: 0.5 mg/dL (ref 0.3–1.2)
Total Protein: 6.9 g/dL (ref 6.5–8.1)

## 2018-12-02 IMAGING — CT CT HEAD W/O CM
3 series · 16 of 47 positions shown, 19 images · non-contrast
Comparison: None.

CLINICAL DATA: Intermittent numbness to left side of face

EXAM:
CT HEAD WITHOUT CONTRAST
TECHNIQUE: Contiguous axial images were obtained from the base of the skull
through the vertex without intravenous contrast.

[Series 2: head wo · axial · 0.42mm/px · z∈[-94,+31]mm · 10 of 30 slices shown, 13 images]
[im 3/30  brain]
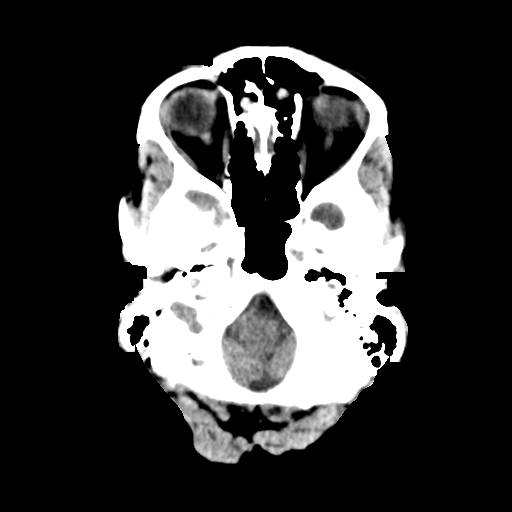
[im 3/30  bone]
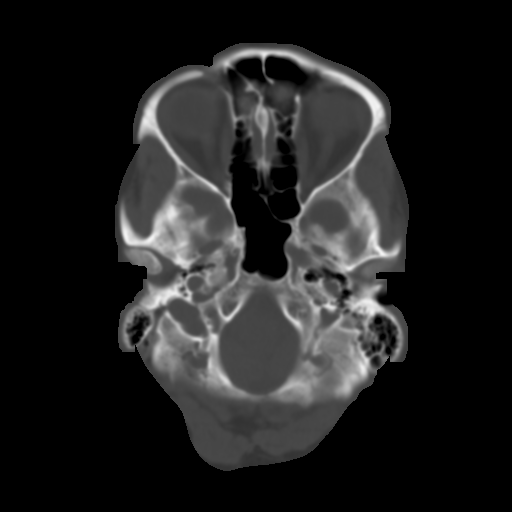
[im 6/30  brain]
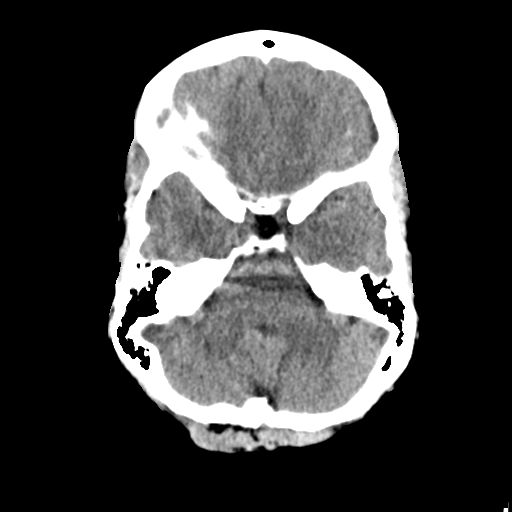
[im 9/30  brain]
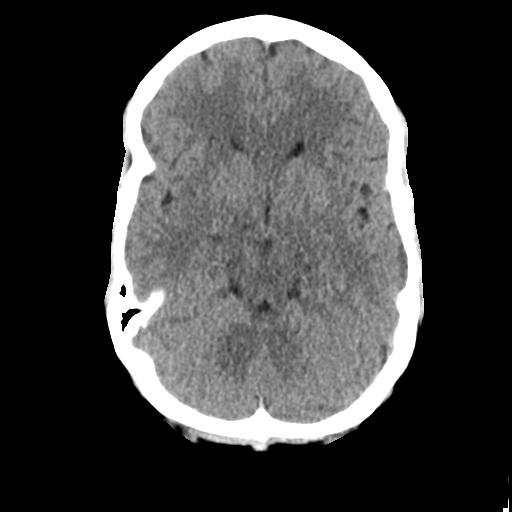
[im 11/30  brain]
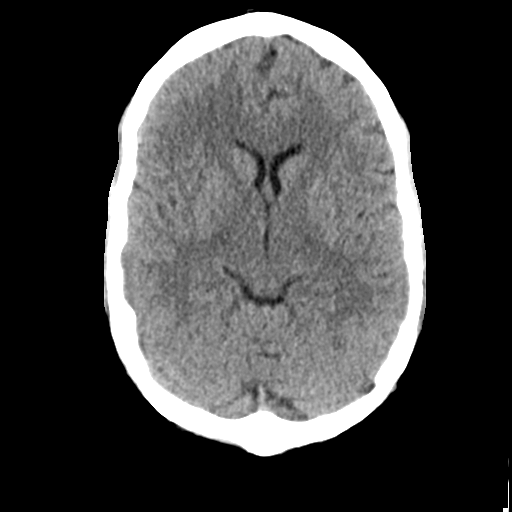
[im 14/30  brain]
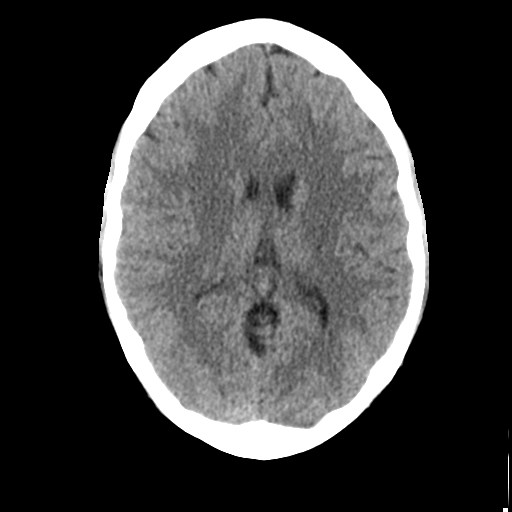
[im 14/30  bone]
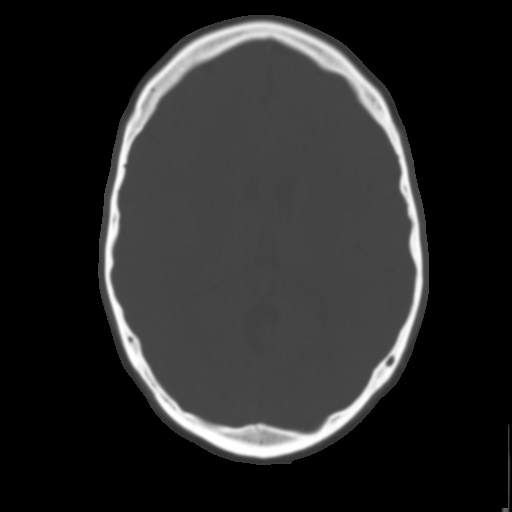
[im 17/30  brain]
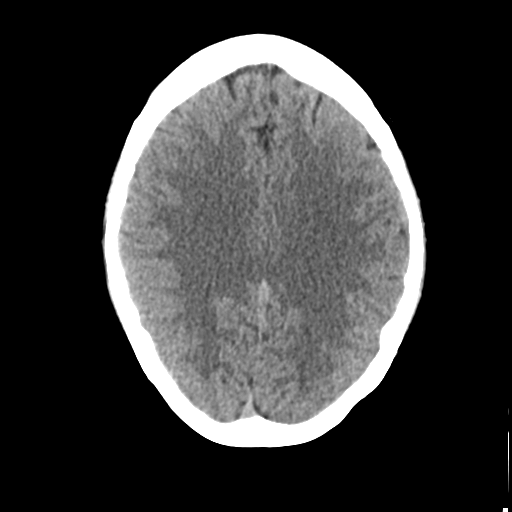
[im 20/30  brain]
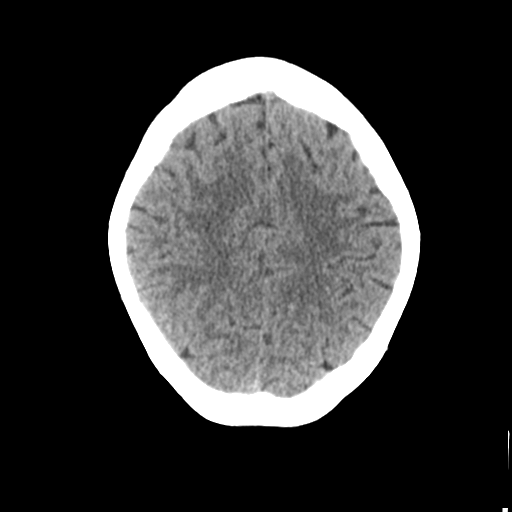
[im 23/30  brain]
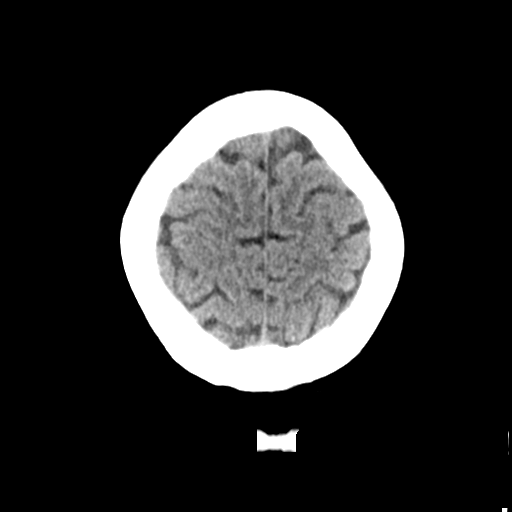
[im 25/30  brain]
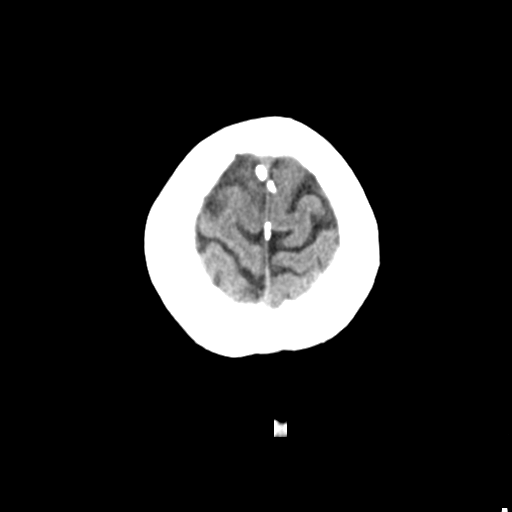
[im 25/30  bone]
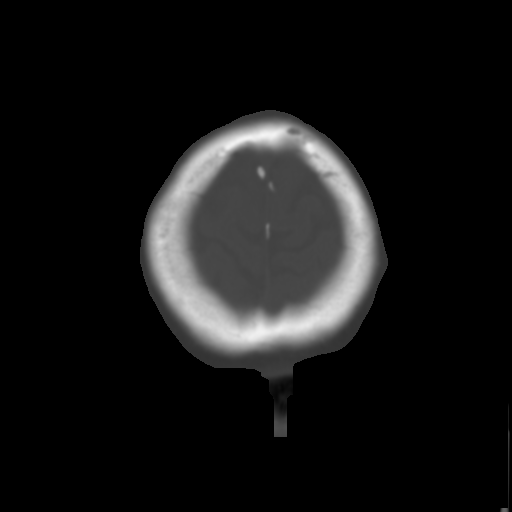
[im 28/30  brain]
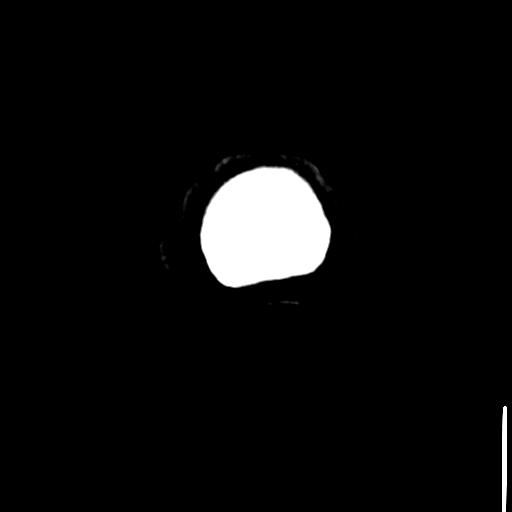

[Series 4: coronal soft tissue · coronal · 0.27mm/px · 3 of 63 slices shown]
[im 21/63  brain]
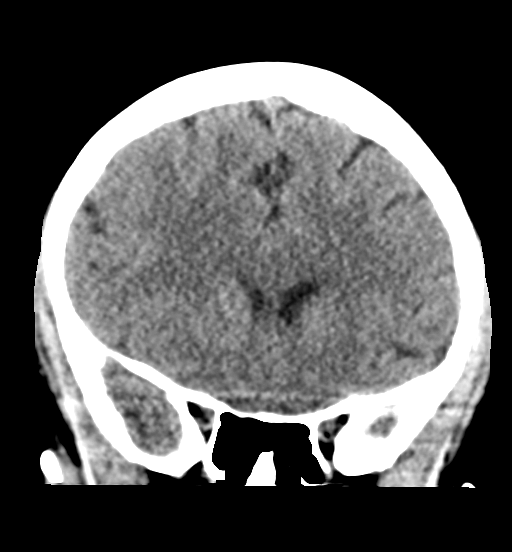
[im 28/63  brain]
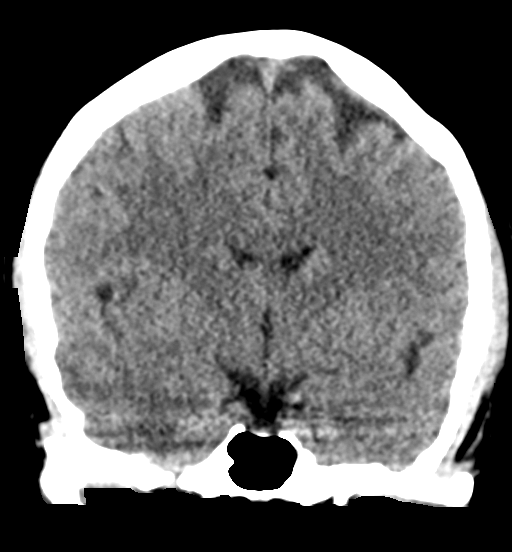
[im 35/63  brain]
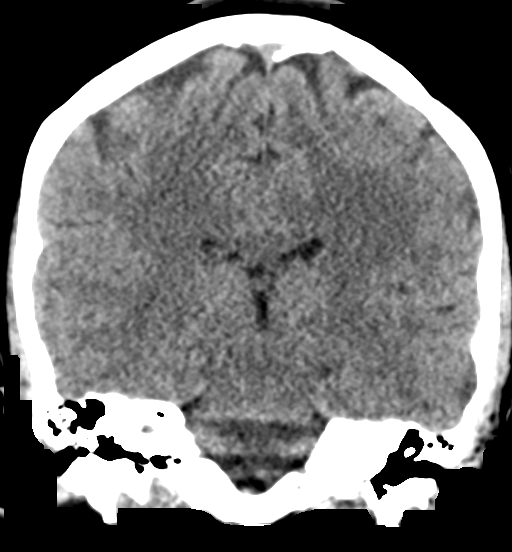

[Series 5: sagittal soft tissue · sagittal · 0.31mm/px · 3 of 45 slices shown]
[im 15/45  brain]
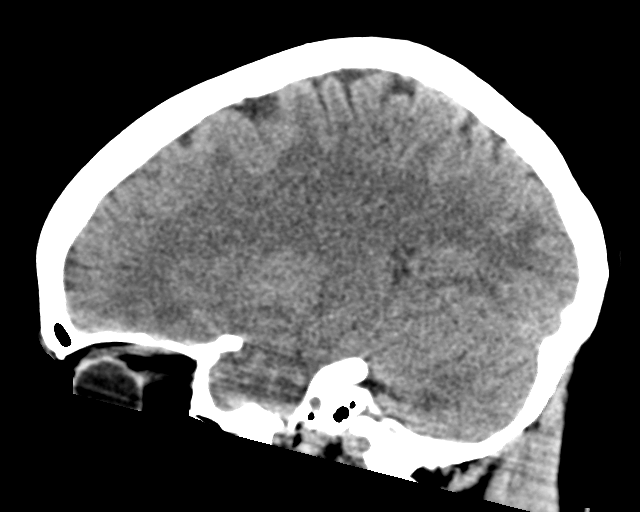
[im 23/45  brain]
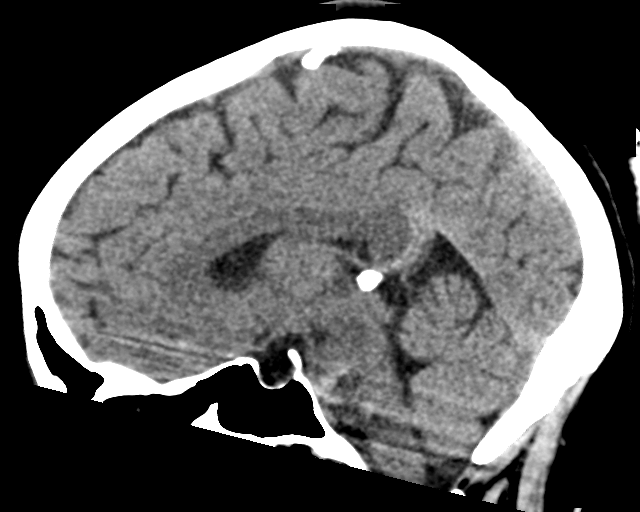
[im 30/45  brain]
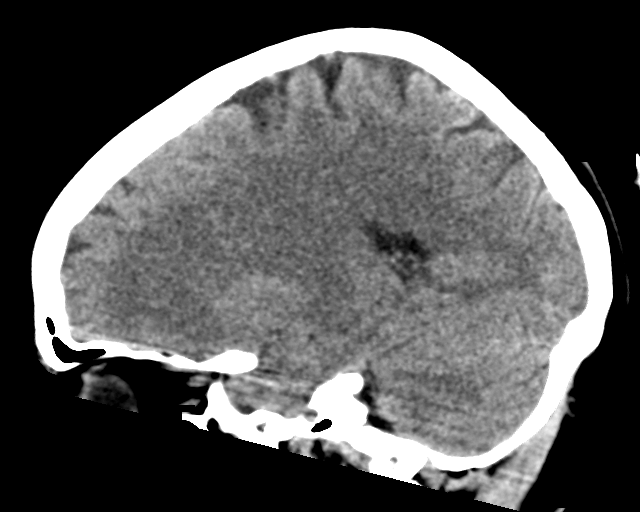

[16 of 47 positions shown; findings below may reference images not displayed]

FINDINGS: Brain: No evidence of acute infarction, hemorrhage, hydrocephalus,
extra-axial collection or mass lesion/mass effect.

Vascular: No hyperdense vessel or unexpected calcification.

Skull: Normal. Negative for fracture or focal lesion.

Sinuses/Orbits: No acute finding.

Other: None
IMPRESSION: Negative non contrasted CT appearance of the brain

## 2018-12-02 NOTE — ED Notes (Signed)
ED signature pad not working at this time. Pt verbalized understanding of DC.

## 2018-12-02 NOTE — ED Notes (Signed)
Patient transported to CT 

## 2018-12-02 NOTE — ED Provider Notes (Signed)
[facility] Emergency Department Provider Note       Time seen: ----------------------------------------- 8:19 PM on 12/02/2018 -----------------------------------------   I have reviewed the triage vital signs and the nursing notes.  HISTORY   Chief Complaint Numbness    HPI Prisma Brom is a 38 y.o. female with no significant past medical history who presents to the ED for intermittent numbness to the left side of her face since Thursday.  Patient states this morning she took one baby aspirin as advised by her doctor.  Family states she has had some memory loss that has been intermittent.  She denies any other focal complaint or neurologic symptom at this time.  Past Medical History:  Diagnosis Date  . No known health problems     There are no active problems to display for this patient.   Past Surgical History:  Procedure Laterality Date  . CHOLECYSTECTOMY  2018  . HERNIA REPAIR    . TUBAL LIGATION      Allergies Patient has no known allergies.  Social History Social History   Tobacco Use  . Smoking status: Former Smoker    Types: Cigarettes  . Smokeless tobacco: Never Used  Substance Use Topics  . Alcohol use: Yes    Comment: occasional  . Drug use: Not Currently   Review of Systems Constitutional: Negative for fever. Cardiovascular: Negative for chest pain. Respiratory: Negative for shortness of breath. Gastrointestinal: Negative for abdominal pain, vomiting and diarrhea. Musculoskeletal: Negative for back pain. Skin: Negative for rash. Neurological: Negative for headaches, positive for numbness  All systems negative/normal/unremarkable except as stated in the HPI  ____________________________________________   PHYSICAL EXAM:  VITAL SIGNS: ED Triage Vitals  Enc Vitals Group     BP      Pulse      Resp      Temp      Temp src      SpO2      Weight      Height      Head Circumference      Peak Flow      Pain  Score      Pain Loc      Pain Edu?      Excl. in [place]?     Constitutional: Alert and oriented. Well appearing and in no distress. Eyes: Conjunctivae are normal. Normal extraocular movements. ENT      Head: Normocephalic and atraumatic.      Nose: No congestion/rhinnorhea.      Mouth/Throat: Mucous membranes are moist.      Neck: No stridor. Cardiovascular: Normal rate, regular rhythm. No murmurs, rubs, or gallops. Respiratory: Normal respiratory effort without tachypnea nor retractions. Breath sounds are clear and equal bilaterally. No wheezes/rales/rhonchi. Gastrointestinal: Soft and nontender. Normal bowel sounds Musculoskeletal: Nontender with normal range of motion in extremities. No lower extremity tenderness nor edema. Neurologic:  Normal speech and language. No gross focal neurologic deficits are appreciated.  Strength, sensation, cranial nerves are normal Skin:  Skin is warm, dry and intact. No rash noted. Psychiatric: Mood and affect are normal. Speech and behavior are normal.  ____________________________________________  ED COURSE:  As part of my medical decision making, I reviewed the following data within the [facility] History obtained from family if available, nursing notes, old chart and ekg, as well as notes from prior ED visits. Patient presented for numbness, we will assess with labs and imaging as indicated at this time.   Procedures  [employer]  Maczka was evaluated in Emergency Department on 12/02/2018 for the symptoms described in the history of present illness. She was evaluated in the context of the global COVID-19 pandemic, which necessitated consideration that the patient might be at risk for infection with the SARS-CoV-2 virus that causes COVID-19. Institutional protocols and algorithms that pertain to the evaluation of patients at risk for COVID-19 are in a state of rapid change based on information released by regulatory bodies including the CDC  and federal and state organizations. These policies and algorithms were followed during the patient's care in the ED.  ____________________________________________   LABS (pertinent positives/negatives)  Labs Reviewed  CBC WITH DIFFERENTIAL/PLATELET - Abnormal; Notable for the following components:      Result Value   Hemoglobin 11.9 (*)    All other components within normal limits  COMPREHENSIVE METABOLIC PANEL - Abnormal; Notable for the following components:   Glucose, Bld 101 (*)    BUN 25 (*)    All other components within normal limits    RADIOLOGY Images were viewed by me  CT head IMPRESSION:  Negative non contrasted CT appearance of the brain  ____________________________________________   DIFFERENTIAL DIAGNOSIS   Paresthesias, migraine, TIA unlikely, CVA unlikely, MS, anxiety  FINAL ASSESSMENT AND PLAN  Paresthesias   Plan: The patient had presented for left facial paresthesias. Patient's labs did not reveal any acute process. Patient's imaging was reassuring.  No clear etiology of her symptoms.  She is cleared for outpatient follow-up with her doctor and possibly neurology referral.   Laurence Aly, MD    Note: This note was generated in part or whole with voice recognition software. Voice recognition is usually quite accurate but there are transcription errors that can and very often do occur. I apologize for any typographical errors that were not detected and corrected.     Earleen Newport, MD 12/02/18 2142

## 2018-12-02 NOTE — ED Triage Notes (Signed)
Pt arrives POV and ambulatory to triage with c/o intermittent numbness to the left side of the face since Thursday. Pt states that this AM she took 1 baby asp[irin. Pt also states that she has been having some difficulty with memory loss.

## 2018-12-03 ENCOUNTER — Other Ambulatory Visit: Payer: Self-pay

## 2018-12-03 ENCOUNTER — Telehealth: Payer: Self-pay | Admitting: Family Medicine

## 2018-12-03 DIAGNOSIS — G459 Transient cerebral ischemic attack, unspecified: Secondary | ICD-10-CM

## 2018-12-03 NOTE — Progress Notes (Unsigned)
Referral to neurology

## 2019-06-20 ENCOUNTER — Ambulatory Visit: Payer: Medicaid Other | Attending: Internal Medicine

## 2019-06-20 DIAGNOSIS — Z20822 Contact with and (suspected) exposure to covid-19: Secondary | ICD-10-CM | POA: Diagnosis not present

## 2019-06-20 DIAGNOSIS — Z8616 Personal history of COVID-19: Secondary | ICD-10-CM

## 2019-06-20 HISTORY — DX: Personal history of COVID-19: Z86.16

## 2019-06-21 LAB — NOVEL CORONAVIRUS, NAA: SARS-CoV-2, NAA: DETECTED — AB

## 2019-06-21 LAB — SARS-COV-2, NAA 2 DAY TAT

## 2019-06-29 ENCOUNTER — Other Ambulatory Visit: Payer: Self-pay

## 2019-06-29 ENCOUNTER — Encounter: Payer: Self-pay | Admitting: Emergency Medicine

## 2019-06-29 ENCOUNTER — Ambulatory Visit
Admission: EM | Admit: 2019-06-29 | Discharge: 2019-06-29 | Disposition: A | Discharge status: Home / Self Care | Payer: Medicaid Other

## 2019-06-29 DIAGNOSIS — H66003 Acute suppurative otitis media without spontaneous rupture of ear drum, bilateral: Secondary | ICD-10-CM | POA: Diagnosis not present

## 2019-06-29 DIAGNOSIS — Z8616 Personal history of COVID-19: Secondary | ICD-10-CM

## 2019-06-29 DIAGNOSIS — H9203 Otalgia, bilateral: Secondary | ICD-10-CM

## 2019-06-29 MED ORDER — FLUCONAZOLE 150 MG PO TABS: ORAL_TABLET | ORAL | 0 refills | Status: DC

## 2019-06-29 MED ORDER — AMOXICILLIN-POT CLAVULANATE 875-125 MG PO TABS: 1.0000 | ORAL_TABLET | Freq: Two times a day (BID) | ORAL | 0 refills | Status: AC

## 2019-06-29 NOTE — Discharge Instructions (Addendum)
It was very nice seeing you today in clinic. Thank you for entrusting me with your care.   Rest and increase fluid intake. Use antibiotics as prescribed. May add allergy medication or decongestant (like Sudafed) to help dry up fluid behind ears.   Make arrangements to follow up with your regular doctor in 1 week for re-evaluation if not improving. If your symptoms/condition worsens, please seek follow up care either here or in the ER. Please remember, our [facility] providers are "right here with you" when you need [place].   Again, it was my pleasure to take care of you today. Thank you for choosing our clinic. I hope that you start to feel better quickly.   Honor Loh, MSN, APRN, FNP-C, CEN Advanced Practice Provider [facility]

## 2019-06-29 NOTE — ED Triage Notes (Signed)
Pt c/o bilateral ear pain. Started about a week ago. She states she has been taking ibuprofen. She was just released from quarantine from covid on 06/27/19.

## 2019-07-01 ENCOUNTER — Ambulatory Visit: Payer: Self-pay | Admitting: Family Medicine

## 2019-07-01 ENCOUNTER — Encounter: Payer: Self-pay | Admitting: Urgent Care

## 2019-07-01 NOTE — ED Provider Notes (Signed)
[place]   Name: Lauren Boyd DOB: Mar 10, 1981 MRN: [id number] CSN: [id number] PCP: [name], MD  Arrival date and time:  06/29/19 1333  Chief Complaint:  Otalgia (bilateral)  NOTE: Prior to seeing the patient today, I have reviewed the triage nursing documentation and vital signs. Clinical staff has updated patient's PMH/PSHx, current medication list, and drug allergies/intolerances to ensure comprehensive history available to assist in medical decision making.   History:   HPI: Lauren Boyd is a 39 y.o. female who presents today with complaints of pain in her BILATERAL ears. Pain began with acute onset approximately 1 week ago. Patient was diagnosed with SARS-CoV-2 (novel coronavirus) on 06/20/2019; just completed her required quarantine period. She denies any associated fevers. Patient does not have any other recent upper respiratory symptoms; no cough, congestion, rhinorrhea, sneezing, or sore throat. She denies forceful nose blowing. Patient has not appreciated any otorrhea. She advises that her ability to hear from the ears has acutely changed with the onset of the pain; describes hearing as being muffled. Patient denies history of recurrent ear infections. She has never had tympanostomy tubes in the past. Patient advising that she has not been swimming in the recent past. Patient denies the use of cotton tip swabs to clean her ears. Patient does not have a history of seasonal allergies.   Past Medical History:  Diagnosis Date  . History of 2019 novel coronavirus disease (COVID-19) 06/20/2019    Past Surgical History:  Procedure Laterality Date  . CHOLECYSTECTOMY  2018  . HERNIA REPAIR    . TUBAL LIGATION      Family History  Problem Relation Age of Onset  . Healthy Mother   . Healthy Father   . Breast cancer Maternal Grandmother 65    Social History   Tobacco Use  . Smoking status: Former Smoker    Types: Cigarettes  . Smokeless tobacco: Never Used    Substance Use Topics  . Alcohol use: Yes    Comment: occasional  . Drug use: Not Currently    There are no problems to display for this patient.   Home Medications:    Current Meds  Medication Sig  . ibuprofen (ADVIL) 200 MG tablet Take 200 mg by mouth every 6 (six) hours as needed.  . Multiple Vitamin (MULTIVITAMIN) tablet Take 1 tablet by mouth daily.  Marland Kitchen VITAMIN D PO Take by mouth.    Allergies:   Patient has no known allergies.  Review of Systems (ROS):  Review of systems NEGATIVE unless otherwise noted in narrative H&P section.   Vital Signs: Today's Vitals   06/29/19 1344 06/29/19 1347  BP:  126/76  Pulse:  71  Resp:  18  Temp:  98.5 F (36.9 C)  TempSrc:  Oral  SpO2:  100%  Weight: 150 lb (68 kg)   Height: 5\' 4"  (1.626 m)   PainSc: 4      Physical Exam: Physical Exam  Constitutional: She is oriented to person, place, and time and well-developed, well-nourished, and in no distress.  HENT:  Head: Normocephalic and atraumatic.  Right Ear: There is tenderness. Tympanic membrane is erythematous. A middle ear effusion is present. Decreased hearing is noted.  Left Ear: There is tenderness. Tympanic membrane is erythematous. A middle ear effusion is present. Decreased hearing is noted.  Nose: Nose normal.  Mouth/Throat: Uvula is midline, oropharynx is clear and moist and mucous membranes are normal.  Eyes: Pupils are equal, round, and reactive to light.  Cardiovascular:  Normal rate, regular rhythm, normal heart sounds and intact distal pulses.  Pulmonary/Chest: Effort normal and breath sounds normal.  Lymphadenopathy:       Head (right side): Submandibular adenopathy present.       Head (left side): Submandibular adenopathy present.  Neurological: She is alert and oriented to person, place, and time. Gait normal.  Skin: Skin is warm and dry. No rash noted. She is not diaphoretic.  Psychiatric: Mood, memory, affect and judgment normal.  Nursing note and vitals  reviewed.   Urgent Care Treatments / Results:   No orders of the defined types were placed in this encounter.   LABS: PLEASE NOTE: all labs that were ordered this encounter are listed, however only abnormal results are displayed. Labs Reviewed - No data to display  EKG: -None  RADIOLOGY: No results found.  PROCEDURES: Procedures  MEDICATIONS RECEIVED THIS VISIT: Medications - No data to display  PERTINENT CLINICAL COURSE NOTES/UPDATES:   Initial Impression / Assessment and Plan / Urgent Care Course:  Pertinent labs & imaging results that were available during my care of the patient were personally reviewed by me and considered in my medical decision making (see lab/imaging section of note for values and interpretations).  Lauren Boyd is a 39 y.o. female who presents to [facility] today with complaints of Otalgia (bilateral)  Patient is well appearing overall in clinic today. She does not appear to be in any acute distress. Presenting symptoms (see HPI) and exam as documented above. Symptoms present x 1 week. She notes that her symptoms developed during her quarantine period for SARS-CoV-2. No fevers or chills. Eating and drinking normally. Will proceed with a 7 day course of amoxicillin-clavulanate. Discussed continue supportive care measures at home during acute phase of illness. Patient to rest as much as possible. She was encouraged to ensure adequate hydration (water and ORS) to prevent dehydration and electrolyte derangements. Patient may use APAP and/or IBU on an as needed basis for pain/fever. Patient has has a history of vulvovaginal candidiasis in the past while on oral antimicrobial therapy. Will send in prophylactic fluconazole dose (150 mg x 1 - may repeat in 72 hours if still symptomatic) for patient to use should she develop symptoms.  Discussed follow up with primary care physician in 1 week for re-evaluation. I have reviewed the follow up and strict return  precautions for any new or worsening symptoms. Patient is aware of symptoms that would be deemed urgent/emergent, and would thus require further evaluation either here or in the emergency department. At the time of discharge, she verbalized understanding and consent with the discharge plan as it was reviewed with her. All questions were fielded by provider and/or clinic staff prior to patient discharge.    Final Clinical Impressions / Urgent Care Diagnoses:   Final diagnoses:  Non-recurrent acute suppurative otitis media of both ears without spontaneous rupture of tympanic membranes  Otalgia of both ears  History of 2019 novel coronavirus disease (COVID-19)    New Prescriptions:  [place] Controlled Substance Registry consulted? Not Applicable  Meds ordered this encounter  Medications  . amoxicillin-clavulanate (AUGMENTIN) 875-125 MG tablet    Sig: Take 1 tablet by mouth 2 (two) times daily for 7 days.    Dispense:  14 tablet    Refill:  0  . fluconazole (DIFLUCAN) 150 MG tablet    Sig: Take 1 tablet (150 mg) PO x 1 dose. May repeat 150 mg dose in 3 days if still symptomatic.  Dispense:  2 tablet    Refill:  0    Recommended Follow up Care:  Patient encouraged to follow up with the following provider within the specified time frame, or sooner as dictated by the severity of her symptoms. As always, she was instructed that for any urgent/emergent care needs, she should seek care either here or in the emergency department for more immediate evaluation.  Follow-up Information    [name], MD In 1 week.   Specialty: Family Medicine Why: General reassessment of symptoms if not improving Contact information: [address] [place] [address] [phone]         NOTE: This note was prepared using Dragon dictation software along with smaller phrase technology. Despite my best ability to proofread, there is the potential that transcriptional errors may still occur from  this process, and are completely unintentional.    Verlee Monte, NP 07/01/19 1215

## 2019-07-12 ENCOUNTER — Other Ambulatory Visit: Payer: Self-pay

## 2019-07-12 ENCOUNTER — Ambulatory Visit (INDEPENDENT_AMBULATORY_CARE_PROVIDER_SITE_OTHER): Payer: Medicaid Other | Admitting: Family Medicine

## 2019-07-12 ENCOUNTER — Encounter: Payer: Self-pay | Admitting: Family Medicine

## 2019-07-12 VITALS — BP 120/72 | HR 72 | Ht 64.0 in | Wt 155.0 lb

## 2019-07-12 DIAGNOSIS — R531 Weakness: Secondary | ICD-10-CM

## 2019-07-12 DIAGNOSIS — M5126 Other intervertebral disc displacement, lumbar region: Secondary | ICD-10-CM

## 2019-07-12 DIAGNOSIS — H6983 Other specified disorders of Eustachian tube, bilateral: Secondary | ICD-10-CM

## 2019-07-12 DIAGNOSIS — R202 Paresthesia of skin: Secondary | ICD-10-CM | POA: Diagnosis not present

## 2019-07-12 MED ORDER — FLUTICASONE PROPIONATE 50 MCG/ACT NA SUSP: 2.0000 | Freq: Every day | NASAL | 6 refills | Status: DC

## 2019-07-12 MED ORDER — MELOXICAM 15 MG PO TABS: 15.0000 mg | ORAL_TABLET | Freq: Every day | ORAL | 3 refills | Status: DC

## 2019-07-12 NOTE — Progress Notes (Signed)
Date:  07/12/2019   Name:  Lauren Boyd   DOB:  [birth year]   MRN:  [id number]   Chief Complaint: Back Pain (been to PT- was told she is a "half inch shorther on R) side" having a sharp pain down R) leg x few months)  Back Pain This is a new problem. The current episode started more than 1 month ago. The problem occurs every several days. The problem has been waxing and waning since onset. The pain is present in the lumbar spine. The quality of the pain is described as aching. The pain radiates to the right thigh, right knee and right foot. The pain is at a severity of 8/10. The pain is moderate. The pain is worse during the night (positional). The symptoms are aggravated by bending and twisting. Pertinent negatives include no abdominal pain, bladder incontinence, bowel incontinence, dysuria, fever, headaches, numbness, paresis, paresthesias, pelvic pain, tingling or weakness.  Neurologic Problem The patient's primary symptoms include focal sensory loss and focal weakness. The patient's pertinent negatives include no altered mental status, clumsiness, loss of balance, memory loss, near-syncope, slurred speech, syncope, visual change or weakness. The current episode started more than 1 month ago. The problem has been waxing and waning since onset. There was left-sided focality noted. Associated symptoms include back pain. Pertinent negatives include no abdominal pain, bladder incontinence, bowel incontinence, dizziness, fatigue, fever, headaches, light-headedness, nausea, shortness of breath or vomiting. (Tia)  Otalgia  There is pain in both ears. The current episode started 1 to 4 weeks ago. The problem has been gradually improving. There has been no fever. Pertinent negatives include no abdominal pain, coughing, diarrhea, ear discharge, headaches, rash, rhinorrhea, sore throat or vomiting. tia    Lab Results  Component Value Date   CREATININE 0.83 12/02/2018   BUN 25 (H) 12/02/2018   NA 140  12/02/2018   K 4.0 12/02/2018   CL 109 12/02/2018   CO2 24 12/02/2018   Lab Results  Component Value Date   CHOL 142 11/30/2018   HDL 72 11/30/2018   LDLCALC 61 11/30/2018   TRIG 38 11/30/2018   CHOLHDL 2.0 11/30/2018   Lab Results  Component Value Date   TSH 1.144 01/07/2018   No results found for: HGBA1C Lab Results  Component Value Date   WBC 6.7 12/02/2018   HGB 11.9 (L) 12/02/2018   HCT 37.2 12/02/2018   MCV 85.3 12/02/2018   PLT 264 12/02/2018   Lab Results  Component Value Date   ALT 16 12/02/2018   AST 17 12/02/2018   ALKPHOS 52 12/02/2018   BILITOT 0.5 12/02/2018     Review of Systems  Constitutional: Negative.  Negative for chills, fatigue, fever and unexpected weight change.  HENT: Negative for congestion, ear discharge, ear pain, rhinorrhea, sinus pressure, sneezing and sore throat.   Eyes: Negative for photophobia, pain, discharge, redness and itching.  Respiratory: Negative for cough, shortness of breath, wheezing and stridor.   Cardiovascular: Negative for near-syncope.  Gastrointestinal: Negative for abdominal pain, blood in stool, bowel incontinence, constipation, diarrhea, nausea and vomiting.  Endocrine: Negative for cold intolerance, heat intolerance, polydipsia, polyphagia and polyuria.  Genitourinary: Negative for bladder incontinence, dysuria, flank pain, frequency, hematuria, menstrual problem, pelvic pain, urgency, vaginal bleeding and vaginal discharge.  Musculoskeletal: Positive for back pain. Negative for arthralgias and myalgias.  Skin: Negative for rash.  Allergic/Immunologic: Negative for environmental allergies and food allergies.  Neurological: Positive for focal weakness. Negative for dizziness, tingling, syncope, weakness, light-headedness,  numbness, headaches, paresthesias and loss of balance.  Hematological: Negative for adenopathy. Does not bruise/bleed easily.  Psychiatric/Behavioral: Negative for dysphoric mood and memory loss.  The patient is not nervous/anxious.     There are no problems to display for this patient.   No Known Allergies  Past Surgical History:  Procedure Laterality Date  . CHOLECYSTECTOMY  2018  . HERNIA REPAIR    . TUBAL LIGATION      Social History   Tobacco Use  . Smoking status: Former Smoker    Types: Cigarettes  . Smokeless tobacco: Never Used  Substance Use Topics  . Alcohol use: Yes    Comment: occasional  . Drug use: Not Currently     Medication list has been reviewed and updated.  Current Meds  Medication Sig  . ibuprofen (ADVIL) 200 MG tablet Take 200 mg by mouth every 6 (six) hours as needed.  . Multiple Vitamin (MULTIVITAMIN) tablet Take 1 tablet by mouth daily.  Marland Kitchen VITAMIN D PO Take by mouth.    PHQ 2/9 Scores 07/12/2019 08/29/2018 08/20/2018  PHQ - 2 Score 0 0 0  PHQ- 9 Score 0 0 0    BP Readings from Last 3 Encounters:  07/12/19 120/72  06/29/19 126/76  12/02/18 110/74    Physical Exam Vitals and nursing note reviewed.  Constitutional:      Appearance: She is well-developed.  HENT:     Head: Normocephalic.     Right Ear: Tympanic membrane, ear canal and external ear normal.     Left Ear: Tympanic membrane, ear canal and external ear normal.  Eyes:     General: Lids are everted, no foreign bodies appreciated. No visual field deficit or scleral icterus.       Left eye: No foreign body or hordeolum.     Conjunctiva/sclera: Conjunctivae normal.     Right eye: Right conjunctiva is not injected.     Left eye: Left conjunctiva is not injected.     Pupils: Pupils are equal, round, and reactive to light.  Neck:     Thyroid: No thyromegaly.     Vascular: No JVD.     Trachea: No tracheal deviation.  Cardiovascular:     Rate and Rhythm: Normal rate and regular rhythm.     Heart sounds: Normal heart sounds. No murmur. No friction rub. No gallop.   Pulmonary:     Effort: Pulmonary effort is normal. No respiratory distress.     Breath sounds: Normal  breath sounds. No wheezing or rales.  Abdominal:     General: Bowel sounds are normal.     Palpations: Abdomen is soft. There is no mass.     Tenderness: There is no abdominal tenderness. There is no guarding or rebound.  Musculoskeletal:        General: No tenderness. Normal range of motion.     Cervical back: Normal range of motion and neck supple.  Lymphadenopathy:     Cervical: No cervical adenopathy.  Skin:    General: Skin is warm.     Findings: No rash.  Neurological:     Mental Status: She is alert and oriented to person, place, and time.     Cranial Nerves: Facial asymmetry present. No cranial nerve deficit or dysarthria.     Sensory: Sensation is intact.     Motor: Weakness present.     Deep Tendon Reflexes: Reflexes normal.     Comments: Motor 4.5 left hand/leg/foot  Psychiatric:  Mood and Affect: Mood is not anxious or depressed.     Wt Readings from Last 3 Encounters:  07/12/19 155 lb (70.3 kg)  06/29/19 150 lb (68 kg)  12/02/18 144 lb (65.3 kg)    BP 120/72   Pulse 72   Ht 5\' 4"  (1.626 m)   Wt 155 lb (70.3 kg)   LMP 07/08/2019   BMI 26.61 kg/m   Assessment and Plan:  1. Facial paresthesia Review of patient's chart notes that on 2 occasions she has had paresthesias to the left side of her face that have been determined to be TIA like.  She has been evaluated and review of a CT of the head was unremarkable.  We will proceed with a neurology consult because of other focal concerns of her upper and lower extremities noted below - Ambulatory referral to Neurology  2. Progressive focal motor weakness New onset.  Progressive.  It was told her that she is walking different places looks like as if she is swinging her left leg out but has not noted any increase in falls or seemingly clumsiness.  On exam it was noted that there is a slight decrease in her hand strength interosseous as well as her left leg.  Given the nature of the left-sided symptoms we will  refer her to neurology for further evaluation with possible MRI evaluation. - Ambulatory referral to Neurology  3. Lumbar herniated disc Patient's been awakening on a couple occasions with discomfort in her lateral right hip hip originating from the lumbar back and extending all the way down the leg and radiation.  This is consistent with a mild herniated disc and we will initiate meloxicam 15 mg once a day.  Should this continue our next step is referral to orthopedics. - meloxicam (MOBIC) 15 MG tablet; Take 1 tablet (15 mg total) by mouth daily.  Dispense: 30 tablet; Refill: 3  4. Eustachian tube dysfunction, bilateral Follow-up from an issue with otalgia was noted to have bilateral otitis media treated with Augmentin and this has recovered and patient still have some allergy concerns for which we will put her on Flonase. - fluticasone (FLONASE) 50 MCG/ACT nasal spray; Place 2 sprays into both nostrils daily.  Dispense: 16 g; Refill: 6

## 2019-07-12 NOTE — Patient Instructions (Signed)

## 2019-07-16 ENCOUNTER — Encounter: Payer: Self-pay | Admitting: Family Medicine

## 2019-07-18 DIAGNOSIS — R202 Paresthesia of skin: Secondary | ICD-10-CM | POA: Insufficient documentation

## 2019-07-18 DIAGNOSIS — R262 Difficulty in walking, not elsewhere classified: Secondary | ICD-10-CM | POA: Insufficient documentation

## 2019-07-18 DIAGNOSIS — Z79899 Other long term (current) drug therapy: Secondary | ICD-10-CM | POA: Diagnosis not present

## 2019-07-18 DIAGNOSIS — E612 Magnesium deficiency: Secondary | ICD-10-CM | POA: Diagnosis not present

## 2019-07-18 DIAGNOSIS — R200 Anesthesia of skin: Secondary | ICD-10-CM | POA: Diagnosis not present

## 2019-07-18 DIAGNOSIS — E559 Vitamin D deficiency, unspecified: Secondary | ICD-10-CM | POA: Diagnosis not present

## 2019-07-18 DIAGNOSIS — E519 Thiamine deficiency, unspecified: Secondary | ICD-10-CM | POA: Diagnosis not present

## 2019-07-18 DIAGNOSIS — Q998 Other specified chromosome abnormalities: Secondary | ICD-10-CM | POA: Diagnosis not present

## 2019-07-18 DIAGNOSIS — E538 Deficiency of other specified B group vitamins: Secondary | ICD-10-CM | POA: Diagnosis not present

## 2019-07-23 DIAGNOSIS — E538 Deficiency of other specified B group vitamins: Secondary | ICD-10-CM | POA: Diagnosis not present

## 2019-07-24 ENCOUNTER — Other Ambulatory Visit: Payer: Self-pay | Admitting: Acute Care

## 2019-07-24 DIAGNOSIS — G35 Multiple sclerosis: Secondary | ICD-10-CM

## 2019-07-29 DIAGNOSIS — E538 Deficiency of other specified B group vitamins: Secondary | ICD-10-CM | POA: Diagnosis not present

## 2019-08-01 ENCOUNTER — Encounter: Payer: Self-pay | Admitting: Hematology and Oncology

## 2019-08-01 ENCOUNTER — Other Ambulatory Visit: Payer: Self-pay

## 2019-08-01 NOTE — Progress Notes (Signed)
[facility]  [address] [place], [place] [address] Phone: [phone]  Fax: [phone]   Clinic Day:  08/02/2019  Referring physician: Jannifer Franklin, NP  Chief Complaint: Lauren Boyd is a 39 y.o. female with a low ferritin and B12 deficiency who is referred in consultation by Chipper Herb, NP for assessment and management.   HPI:  The patient was seen by Chipper Herb, NP at [facility] on 07/18/2019 for an initial consult for a 6 month history of numbness and tingling on the left side of her face.  She described back pain.  Head CT without contrast on 12/02/2018 was negative. Labs and head MRI were ordered.  Labs on 07/19/2019 included a hematocrit 37.1, hemoglobin 11.6, MCV 89.4, platelets 269,000, WBC 6200, ANC 3700.  Creatinine was 0.8 with normal LFTs. Ferritin was 4.  B12 was 149 (low) and folate 10.0.  Vitamin B1 and vitamin D, 25-hydroxy were normal.  TSH was 1.430.  Head MRI is scheduled for 08/05/2019.  Symptomatically, she feels good today. She had COVID-19 in 05/2019.  Her symptoms began with loss of taste and smell for 5 days. She did not have shortness of breath. She had a cough which lasted a day. She had a headache. She tested positive for COVID-19 the Wednesday after Easter (06/19/2019). She did not seek medical attention. She started having issues with numbness and tingling on the left side of her face last year. Symptoms are intermittent. When it first started, it was persistent; she would experience it everyday. She has had trouble with her right hip. She denies balance and coordination issues.   Her diet consists of whatever she likes to eat. She has meat everyday; she does not have dark green leafy vegetables very often, maybe 3-4 times a week. She denies any cravings besides [place] food. She has noticed whenever she eats [place], [place], or steak she wakes up with a headache the next day. She denies ice pica.    She has periods that last 6-7 days. She goes through 3-4 pads a day. She describes her periods as heavy and sometimes there are clots. Symptoms have been ongoing for a year. She sometimes experiences lightheadedness. She describes a short sharp pain in the left side of her head. She says she doesn't think she easily bruises or bleeds. She doesn't usually take aspirin or ibuprofen. She has never had surgery on her stomach. She is B12 deficient, which is new to her. She has been taking B12 vitamins once a day since 07/19/2019. She has never taken oral iron before, however agrees to begin taking it.   She denies any fever, sweats, or weight loss. She denies any visual changes. She denies any chest pain. She says she has been experiencing nausea on and off for about 13 years. She denies reflux. She denies any urinary symptoms. Her bones and joints are okay besides occasional discomfort in her right hip.   She denies any family history of blood disorders.    Past Medical History:  Diagnosis Date  . History of 2019 novel coronavirus disease (COVID-19) 06/20/2019    Past Surgical History:  Procedure Laterality Date  . CHOLECYSTECTOMY  2018  . HERNIA REPAIR    . TUBAL LIGATION      Family History  Problem Relation Age of Onset  . Healthy Mother   . Healthy Father   . Breast cancer Maternal Grandmother 70    Social History:  reports that she has  quit smoking. Her smoking use included cigarettes. She has never used smokeless tobacco. She reports current alcohol use. She reports previous drug use. She occasionally drinks alcohol. She denies use of tobacco. She denies any exposure to toxins or radiations. She is a Freight forwarder at [employer] in [place]. The patient is alone today.  Allergies: No Known Allergies  Current Medications: Current Outpatient Medications  Medication Sig Dispense Refill  . ferrous sulfate 324 MG TBEC Take 324 mg by mouth.    . fluticasone (FLONASE) 50 MCG/ACT  nasal spray Place 2 sprays into both nostrils daily. 16 g 6  . ibuprofen (ADVIL) 200 MG tablet Take 200 mg by mouth every 6 (six) hours as needed.    . meloxicam (MOBIC) 15 MG tablet Take 1 tablet (15 mg total) by mouth daily. 30 tablet 3  . Multiple Vitamin (MULTIVITAMIN) tablet Take 1 tablet by mouth daily.    Marland Kitchen VITAMIN D PO Take by mouth.     No current facility-administered medications for this visit.    Review of Systems  Constitutional: Negative for chills, diaphoresis, fever, malaise/fatigue and weight loss.       Feels "good".  HENT: Negative for congestion, ear pain, hearing loss, nosebleeds, sinus pain, sore throat and tinnitus.   Eyes: Negative.  Negative for blurred vision, double vision and photophobia.  Respiratory: Negative.  Negative for cough, hemoptysis, sputum production and shortness of breath.   Cardiovascular: Negative.  Negative for chest pain, palpitations, orthopnea, leg swelling and PND.  Gastrointestinal: Positive for nausea (intermittent x 13 years). Negative for abdominal pain, constipation, diarrhea, heartburn and vomiting.  Genitourinary: Negative for dysuria, flank pain, frequency, hematuria and urgency.  Musculoskeletal: Positive for joint pain (right hip). Negative for back pain, falls, myalgias and neck pain.  Skin: Negative for itching and rash.  Neurological: Positive for sensory change (tingling and numbness on the left side of her face) and headaches (occasional sharp pain in left side of head). Negative for dizziness, tingling, speech change, focal weakness and weakness.       Occasional lightheadedness.   Endo/Heme/Allergies: Negative.  Negative for environmental allergies. Does not bruise/bleed easily.  Psychiatric/Behavioral: Negative.  Negative for depression. The patient is not nervous/anxious and does not have insomnia.    Performance status (ECOG): 0-1  Vitals Blood pressure 122/75, pulse 84, temperature (!) 96.8 F (36 C), temperature  source Tympanic, resp. rate 16, weight 158 lb 8.2 oz (71.9 kg), last menstrual period 07/08/2019, SpO2 100 %.   Physical Exam  Constitutional: She is oriented to person, place, and time. She appears well-developed and well-nourished. No distress. Face mask in place.  HENT:  Head: Normocephalic and atraumatic.  Mouth/Throat: Oropharynx is clear and moist and mucous membranes are normal. No oral lesions. No oropharyngeal exudate.  Shoulder length brown and blonde hair.  Eyes: Pupils are equal, round, and reactive to light. Conjunctivae and EOM are normal. Right eye exhibits no discharge. Left eye exhibits no discharge. No scleral icterus.  Blue eyes.  Neck: No JVD present.  Cardiovascular: Normal rate, regular rhythm and normal heart sounds. Exam reveals no gallop and no friction rub.  No murmur heard. Pulmonary/Chest: Effort normal and breath sounds normal. She has no wheezes. She has no rhonchi. She has no rales.  Abdominal: Soft. Normal appearance and bowel sounds are normal. She exhibits no distension and no mass. There is no hepatosplenomegaly. There is no abdominal tenderness. There is no rebound, no guarding and no CVA tenderness.  Musculoskeletal:  General: No tenderness or edema. Normal range of motion.     Cervical back: Normal range of motion and neck supple.  Lymphadenopathy:       Head (right side): No preauricular, no posterior auricular and no occipital adenopathy present.       Head (left side): No preauricular, no posterior auricular and no occipital adenopathy present.    She has no cervical adenopathy.    She has no axillary adenopathy.       Right: No inguinal and no supraclavicular adenopathy present.       Left: No inguinal and no supraclavicular adenopathy present.  Neurological: She is alert and oriented to person, place, and time.  Skin: Skin is warm, dry and intact. No bruising, no lesion and no rash noted. She is not diaphoretic. No erythema. No pallor.  Tan.   Tattoos.  Psychiatric: She has a normal mood and affect. Her behavior is normal. Judgment and thought content normal.  Nursing note and vitals reviewed.   No visits with results within 3 Day(s) from this visit.  Latest known visit with results is:  Lab on 06/20/2019  Component Date Value Ref Range Status  . SARS-CoV-2, NAA 06/20/2019 Detected* Not Detected Final   Comment: Patients who have a positive COVID-19 test result may now have treatment options. Treatment options are available for patients with mild to moderate symptoms and for hospitalized patients. Visit our website at [email or web address] for resources and information. This nucleic acid amplification test was developed and its performance characteristics determined by [employer]. Nucleic acid amplification tests include RT-PCR and TMA. This test has not been FDA cleared or approved. This test has been authorized by FDA under an Emergency Use Authorization (EUA). This test is only authorized for the duration of time the declaration that circumstances exist justifying the authorization of the emergency use of in vitro diagnostic tests for detection of SARS-CoV-2 virus and/or diagnosis of COVID-19 infection under section 564(b)(1) of the Act, 21 U.S.C. [id number]) (1), unless the authorization is terminated or revoked sooner. When diagnostic testing is negativ                          e, the possibility of a false negative result should be considered in the context of a patient's recent exposures and the presence of clinical signs and symptoms consistent with COVID-19. An individual without symptoms of COVID-19 and who is not shedding SARS-CoV-2 virus would expect to have a negative (not detected) result in this assay.   Marland Kitchen SARS-CoV-2, NAA 2 DAY TAT 06/20/2019 Performed   Final    Assessment:  Lauren Boyd is a 39 y.o. female with a low ferritin and B12 deficiency.  She notes menorrhagia for the  past year.  Diet appears good.  She denies any melena, hematochezia or hematuria.  She denies pica or restless legs.  Labs on 07/19/2019 included a hematocrit 37.1, hemoglobin 11.6, MCV 89.4, platelets 269,000, WBC 6200, ANC 3700.  Creatinine was 0.8 with normal LFTs. Ferritin was 4.  B12 was 149 (low) and folate 10.0.  Vitamin B1 and vitamin D, 25-hydroxy were normal.  TSH was 1.430.  She has B12 deficiency.  Oral B12 began on 07/19/2019.  She tested positive for COVID-19 on 06/19/2019.  Symptomatically, she feels good.  She denies any fevers, sweats or weight loss.  She denies any reflux.  Exam is unremarkable.  Plan: 1.   Labs today: CBC with diff, iron studies, antiparietal antibodies,  intrinsic factor antibodies. 2.   Low ferritin  She appears to have iron deficiency secondary to heavy menses.  Discuss consideration of work-up for a bleeding diathesis during next heavy menses.  Diet appears good.  She denies any other bleeding.  Discuss oral iron (ferrous sulfate) with orange juice or vitamin C. 3.   B12 deficiency  Diet appears good.  She has documented B12 deficiency.  B12 was 149 (low) on 07/19/2019.  She began oral B12.  Discuss checking B12 after 1 month on supplementation.  If B12 remains low (< 400) begin B12 injections.   Preauth B12.  Work-up to rule out pernicious anemia. 4.   RTC on 08/19/2019 for labs (CBC, B12). 5.   RTC on 08/21/2019 for MD assessment, review of work-up, and +/- B12.  I discussed the assessment and treatment plan with the patient.  The patient was provided an opportunity to ask questions and all were answered.  The patient agreed with the plan and demonstrated an understanding of the instructions.  The patient was advised to call back if the symptoms worsen or if the condition fails to improve as anticipated.   Venora Kautzman C. Mike Gip, MD, PhD    08/02/2019, 3:15 PM  I, Heywood Footman, am acting as Education administrator for [employer]. Mike Gip, MD, PhD.  I, Season Astacio  C. Mike Gip, MD, have reviewed the above documentation for accuracy and completeness, and I agree with the above.

## 2019-08-01 NOTE — Progress Notes (Signed)
Patient pre screened for office appointment, no questions or concerns today. Patient reminded of upcoming appointment time and date. She has MRI scheduled for Monday brain

## 2019-08-02 ENCOUNTER — Inpatient Hospital Stay: Payer: Medicaid Other | Attending: Hematology and Oncology | Admitting: Hematology and Oncology

## 2019-08-02 ENCOUNTER — Encounter: Payer: Self-pay | Admitting: Hematology and Oncology

## 2019-08-02 ENCOUNTER — Inpatient Hospital Stay: Payer: Medicaid Other

## 2019-08-02 VITALS — BP 122/75 | HR 84 | Temp 96.8°F | Resp 16 | Wt 158.5 lb

## 2019-08-02 DIAGNOSIS — Z8616 Personal history of COVID-19: Secondary | ICD-10-CM | POA: Diagnosis not present

## 2019-08-02 DIAGNOSIS — R790 Abnormal level of blood mineral: Secondary | ICD-10-CM | POA: Insufficient documentation

## 2019-08-02 DIAGNOSIS — Z803 Family history of malignant neoplasm of breast: Secondary | ICD-10-CM | POA: Diagnosis not present

## 2019-08-02 DIAGNOSIS — E538 Deficiency of other specified B group vitamins: Secondary | ICD-10-CM | POA: Diagnosis not present

## 2019-08-02 DIAGNOSIS — Z87891 Personal history of nicotine dependence: Secondary | ICD-10-CM | POA: Insufficient documentation

## 2019-08-02 LAB — CBC WITH DIFFERENTIAL/PLATELET
Abs Immature Granulocytes: 0.02 10*3/uL (ref 0.00–0.07)
Basophils Absolute: 0.1 10*3/uL (ref 0.0–0.1)
Basophils Relative: 1 %
Eosinophils Absolute: 0.1 10*3/uL (ref 0.0–0.5)
Eosinophils Relative: 2 %
HCT: 35.6 % — ABNORMAL LOW (ref 36.0–46.0)
Hemoglobin: 11.5 g/dL — ABNORMAL LOW (ref 12.0–15.0)
Immature Granulocytes: 0 %
Lymphocytes Relative: 25 %
Lymphs Abs: 1.8 10*3/uL (ref 0.7–4.0)
MCH: 28.3 pg (ref 26.0–34.0)
MCHC: 32.3 g/dL (ref 30.0–36.0)
MCV: 87.7 fL (ref 80.0–100.0)
Monocytes Absolute: 0.5 10*3/uL (ref 0.1–1.0)
Monocytes Relative: 7 %
Neutro Abs: 4.7 10*3/uL (ref 1.7–7.7)
Neutrophils Relative %: 65 %
Platelets: 259 10*3/uL (ref 150–400)
RBC: 4.06 MIL/uL (ref 3.87–5.11)
RDW: 13.7 % (ref 11.5–15.5)
WBC: 7.2 10*3/uL (ref 4.0–10.5)
nRBC: 0 % (ref 0.0–0.2)

## 2019-08-02 NOTE — Patient Instructions (Signed)
  Ferrous sulfate 325 mg (65 mg of elemental iron) Take 1 tablet by mouth once a day with orange juice or vitamin C. If tolerated, may increase to 1 tablet twice a day.   Iron Deficiency Anemia, Adult Iron-deficiency anemia is when you have a low amount of red blood cells or hemoglobin. This happens because you have too little iron in your body. Hemoglobin carries oxygen to parts of the body. Anemia can cause your body to not get enough oxygen. It may or may not cause symptoms. Follow these instructions at home: Medicines  Take over-the-counter and prescription medicines only as told by your doctor. This includes iron pills (supplements) and vitamins.  If you cannot handle taking iron pills by mouth, ask your doctor about getting iron through: ? A vein (intravenously). ? A shot (injection) into a muscle.  Take iron pills when your stomach is empty. If you cannot handle this, take them with food.  Do not drink milk or take antacids at the same time as your iron pills.  To prevent trouble pooping (constipation), eat fiber or take medicine (stool softener) as told by your doctor. Eating and drinking   Talk with your doctor before changing the foods you eat. He or she may tell you to eat foods that have a lot of iron, such as: ? Liver. ? Lowfat (lean) beef. ? Breads and cereals that have iron added to them (fortified breads and cereals). ? Eggs. ? Dried fruit. ? Dark green, leafy vegetables.  Drink enough fluid to keep your pee (urine) clear or pale yellow.  Eat fresh fruits and vegetables that are high in vitamin C. They help your body to use iron. Foods with a lot of vitamin C include: ? Oranges. ? Peppers. ? Tomatoes. ? Mangoes. General instructions  Return to your normal activities as told by your doctor. Ask your doctor what activities are safe for you.  Keep yourself clean, and keep things clean around you (your surroundings). Anemia can make you get sick more  easily.  Keep all follow-up visits as told by your doctor. This is important. Contact a doctor if:  You feel sick to your stomach (nauseous).  You throw up (vomit).  You feel weak.  You are sweating for no clear reason.  You have trouble pooping, such as: ? Pooping (having a bowel movement) less than 3 times a week. ? Straining to poop. ? Having poop that is hard, dry, or larger than normal. ? Feeling full or bloated. ? Pain in the lower belly. ? Not feeling better after pooping. Get help right away if:  You pass out (faint). If this happens, do not drive yourself to the hospital. Call your local emergency services (911 in the [place]  You have chest pain.  You have shortness of breath that: ? Is very bad. ? Gets worse with physical activity.  You have a fast heartbeat.  You get light-headed when getting up from sitting or lying down. This information is not intended to replace advice given to you by your health care provider. Make sure you discuss any questions you have with your health care provider. Document Revised: 02/10/2017 Document Reviewed: 11/18/2015 Elsevier Patient Education  [place].

## 2019-08-02 NOTE — Progress Notes (Signed)
New patient here for evaluation of abnormal lab work. Patient states that she has been having problems with left sided facial numbness and left arm weakness for the last several months. She is scheduled for an MRI of the brain on Monday. She reports intermittent right sided hip pain, which comes and goes with no explanation.

## 2019-08-03 LAB — ANTI-PARIETAL ANTIBODY: Parietal Cell Antibody-IgG: 0.8 Units (ref 0.0–20.0)

## 2019-08-03 LAB — IRON AND TIBC
Iron: 59 ug/dL (ref 28–170)
Saturation Ratios: 15 % (ref 10.4–31.8)
TIBC: 391 ug/dL (ref 250–450)
UIBC: 332 ug/dL

## 2019-08-05 ENCOUNTER — Other Ambulatory Visit: Payer: Self-pay

## 2019-08-05 ENCOUNTER — Ambulatory Visit
Admission: RE | Admit: 2019-08-05 | Discharge: 2019-08-05 | Disposition: A | Discharge status: Home / Self Care | Payer: Medicaid Other | Source: Ambulatory Visit | Attending: Acute Care | Admitting: Acute Care

## 2019-08-05 DIAGNOSIS — G35 Multiple sclerosis: Secondary | ICD-10-CM | POA: Insufficient documentation

## 2019-08-05 DIAGNOSIS — E538 Deficiency of other specified B group vitamins: Secondary | ICD-10-CM | POA: Diagnosis not present

## 2019-08-05 DIAGNOSIS — R200 Anesthesia of skin: Secondary | ICD-10-CM | POA: Diagnosis not present

## 2019-08-05 LAB — INTRINSIC FACTOR ANTIBODIES: Intrinsic Factor: 1 AU/mL (ref 0.0–1.1)

## 2019-08-05 IMAGING — MR MR HEAD WO/W CM
7 series · 48 of 48 positions shown · IV contrast (gadavist)
Comparison: Head CT [DATE]

CLINICAL DATA: Left-sided facial numbness and tingling.

EXAM:
MRI HEAD WITHOUT AND WITH CONTRAST
TECHNIQUE: Multiplanar, multiecho pulse sequences of the brain and surrounding
structures were obtained without and with intravenous contrast.
CONTRAST:  7mL GADAVIST GADOBUTROL 1 MMOL/ML IV SOLN

[Series 5: ax dwi_tracew · axial · 3.0mm · 0.60mm/px · z∈[-140,+14]mm · 6 of 48 slices shown]
[im 1/48]
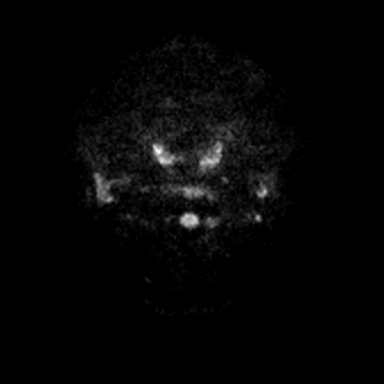
[im 10/48]
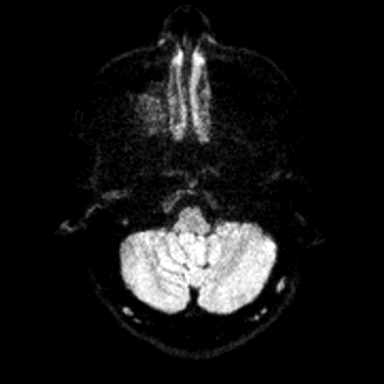
[im 19/48]
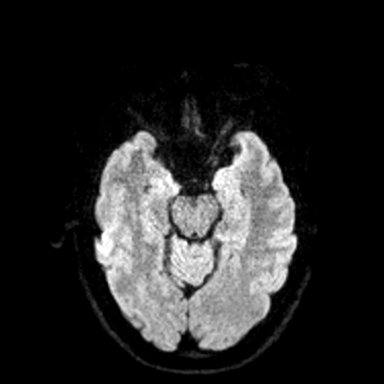
[im 29/48]
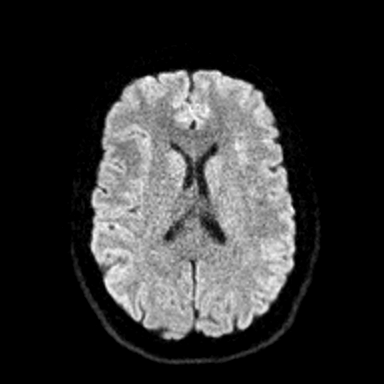
[im 38/48]
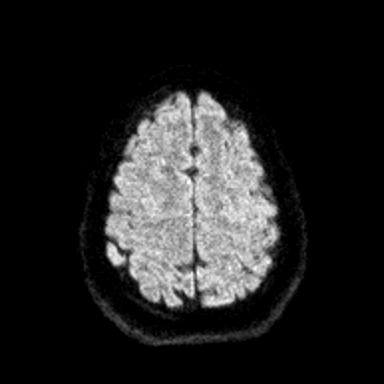
[im 48/48]
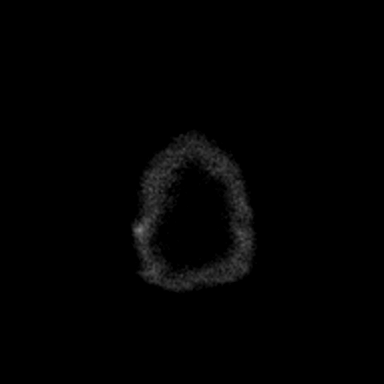

[Series 7: cor dwi_tracew · coronal · 5.0mm · 0.60mm/px · 5 of 40 slices shown]
[im 1/40]
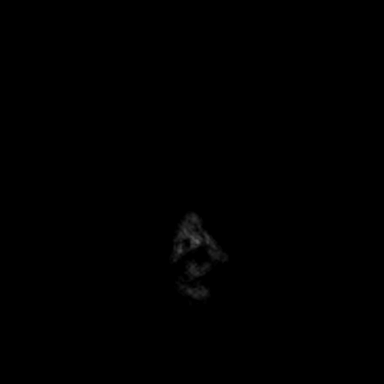
[im 10/40]
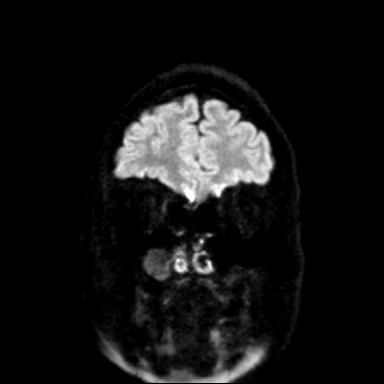
[im 20/40]
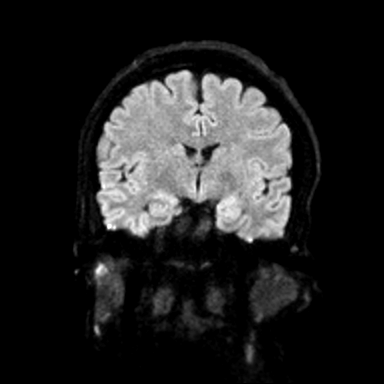
[im 30/40]
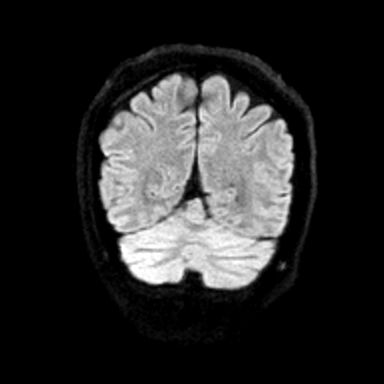
[im 40/40]
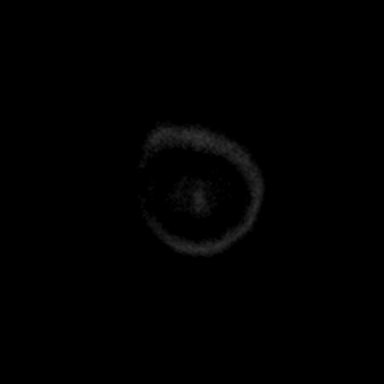

[Series 14: swi_images · axial · 3.0mm · 0.90mm/px · z∈[-150,+26]mm · 7 of 60 slices shown]
[im 1/60]
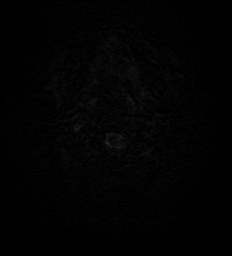
[im 10/60]
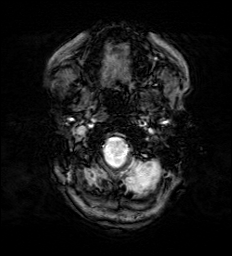
[im 20/60]
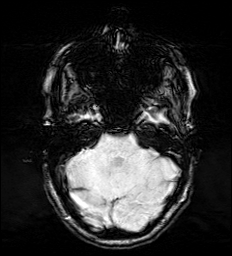
[im 30/60]
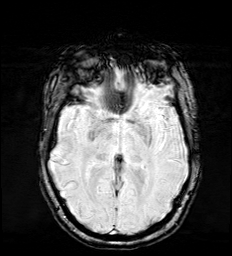
[im 40/60]
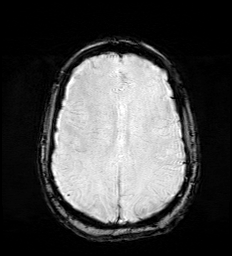
[im 50/60]
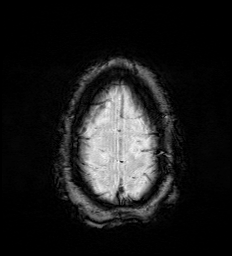
[im 60/60]
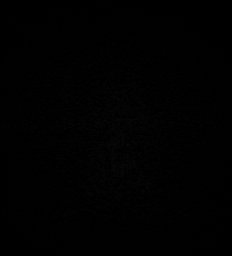

[Series 17: T1 · axial · 1.0mm · 0.98mm/px · z∈[-150,+24]mm · 21 of 176 slices shown]
[im 1/176]
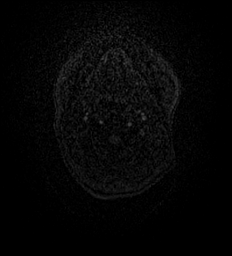
[im 9/176]
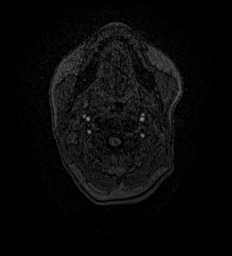
[im 18/176]
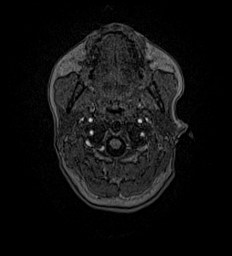
[im 27/176]
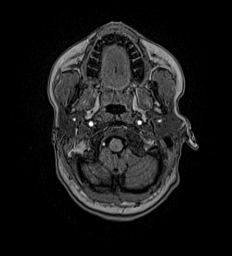
[im 36/176]
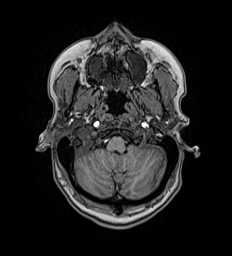
[im 44/176]
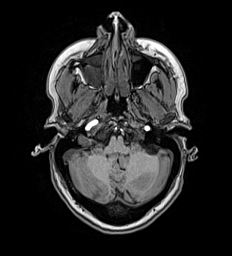
[im 53/176]
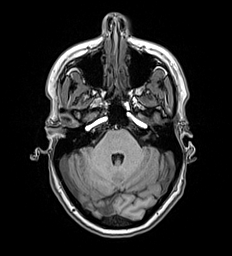
[im 62/176]
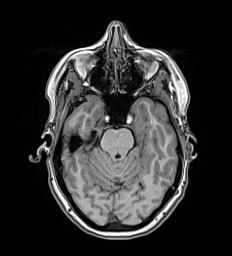
[im 71/176]
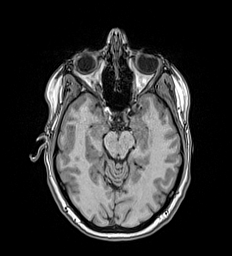
[im 79/176]
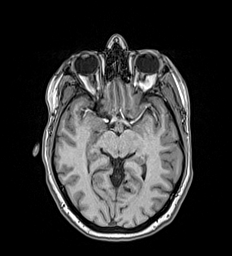
[im 88/176]
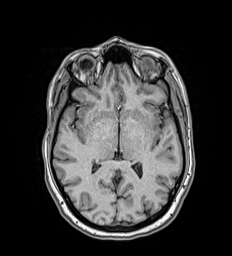
[im 97/176]
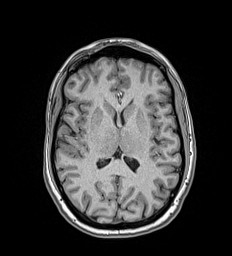
[im 106/176]
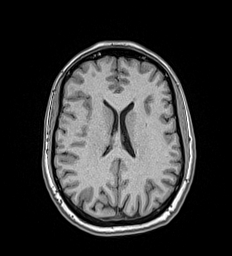
[im 114/176]
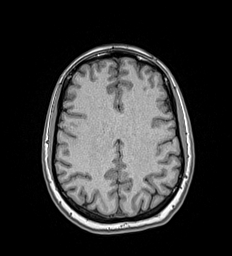
[im 123/176]
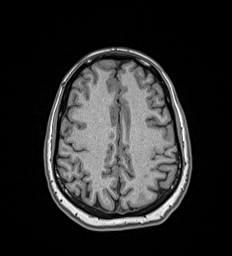
[im 132/176]
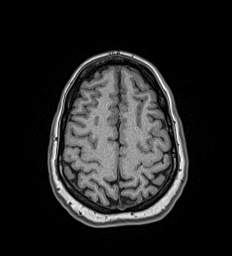
[im 141/176]
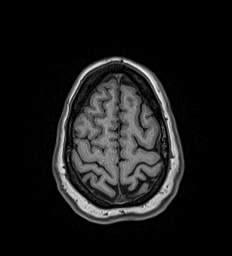
[im 149/176]
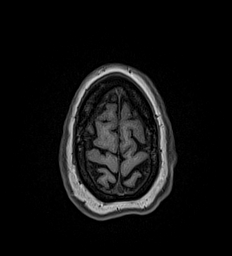
[im 158/176]
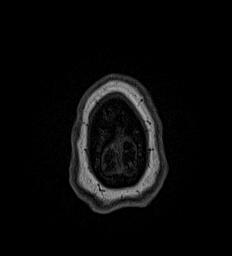
[im 167/176]
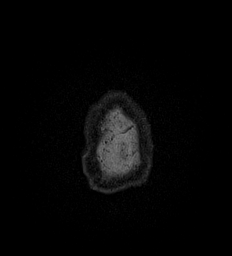
[im 176/176]
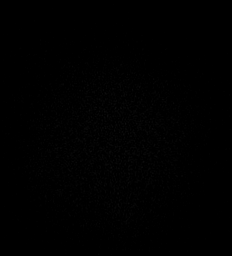

[Series 18: T2 post-contrast · coronal · 5.0mm · 0.57mm/px · 3 of 29 slices shown]
[im 1/29]
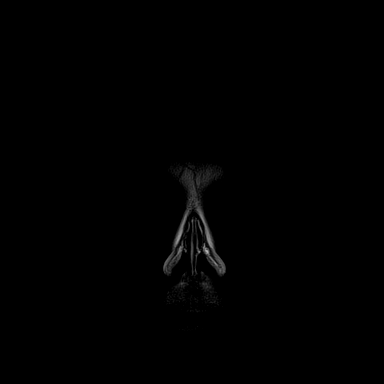
[im 15/29]
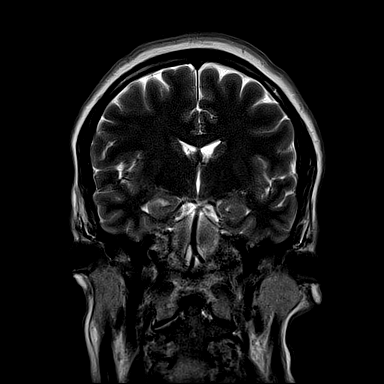
[im 29/29]
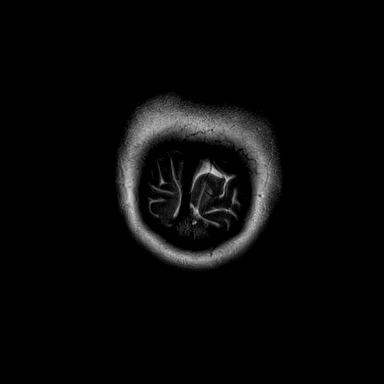

[Series 20: T1 post-contrast · coronal · 5.0mm · 0.57mm/px · 3 of 29 slices shown (1 of 2)]
[im 1/29]
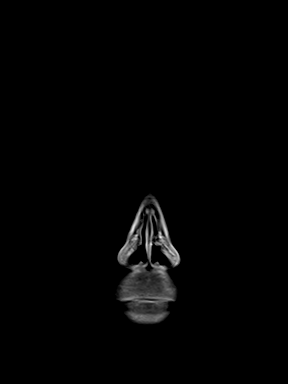
[im 15/29]
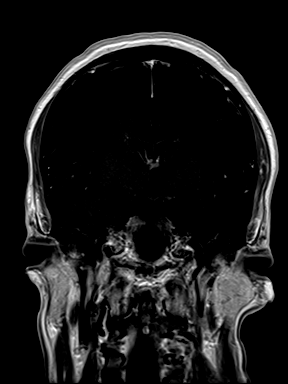
[im 29/29]
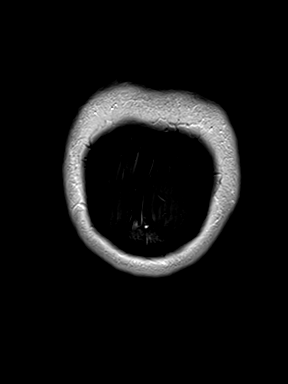

[Series 21: T1 post-contrast · sagittal · 5.0mm · 0.62mm/px · 3 of 21 slices shown (2 of 2)]
[im 1/21]
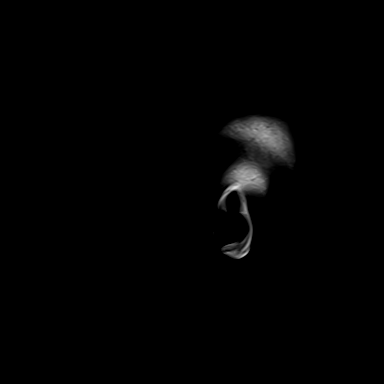
[im 11/21]
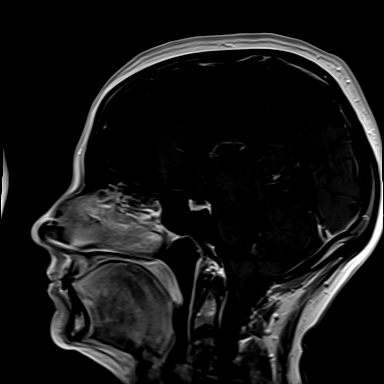
[im 21/21]
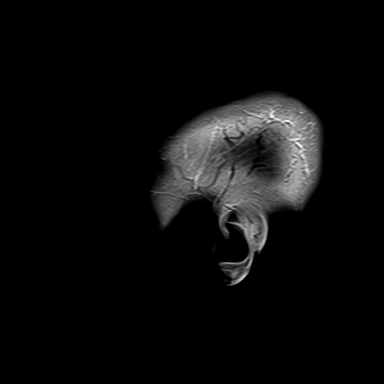

[48 of 48 positions shown; findings below may reference images not displayed]

FINDINGS: Brain: No acute infarction, hemorrhage, hydrocephalus, extra-axial
collection or mass lesion.

The brain parenchyma has normal morphology and signal
characteristics. No focus of abnormal contrast enhancement
identified.

Vascular: Normal flow voids.

Skull and upper cervical spine: Normal marrow signal.

Sinuses/Orbits: Mucous retention cyst in the. The orbits are
maintained.

Other: None.
IMPRESSION: Unremarkable MRI of the brain.

## 2019-08-05 MED ORDER — GADOBUTROL 1 MMOL/ML IV SOLN
7.0000 mL | Freq: Once | INTRAVENOUS | Status: AC | PRN
  Administered 2019-08-05: 7 mL via INTRAVENOUS

## 2019-08-13 DIAGNOSIS — E538 Deficiency of other specified B group vitamins: Secondary | ICD-10-CM | POA: Diagnosis not present

## 2019-08-15 ENCOUNTER — Other Ambulatory Visit: Payer: Self-pay | Admitting: Licensed Clinical Social Worker

## 2019-08-15 DIAGNOSIS — R200 Anesthesia of skin: Secondary | ICD-10-CM | POA: Diagnosis not present

## 2019-08-15 DIAGNOSIS — R202 Paresthesia of skin: Secondary | ICD-10-CM | POA: Diagnosis not present

## 2019-08-15 DIAGNOSIS — E538 Deficiency of other specified B group vitamins: Secondary | ICD-10-CM

## 2019-08-15 DIAGNOSIS — R790 Abnormal level of blood mineral: Secondary | ICD-10-CM

## 2019-08-15 DIAGNOSIS — R531 Weakness: Secondary | ICD-10-CM | POA: Diagnosis not present

## 2019-08-19 ENCOUNTER — Inpatient Hospital Stay: Payer: Medicaid Other | Attending: Hematology and Oncology

## 2019-08-19 ENCOUNTER — Other Ambulatory Visit: Payer: Self-pay

## 2019-08-19 DIAGNOSIS — Z8616 Personal history of COVID-19: Secondary | ICD-10-CM | POA: Insufficient documentation

## 2019-08-19 DIAGNOSIS — N920 Excessive and frequent menstruation with regular cycle: Secondary | ICD-10-CM | POA: Insufficient documentation

## 2019-08-19 DIAGNOSIS — E611 Iron deficiency: Secondary | ICD-10-CM | POA: Insufficient documentation

## 2019-08-19 DIAGNOSIS — R202 Paresthesia of skin: Secondary | ICD-10-CM | POA: Insufficient documentation

## 2019-08-19 DIAGNOSIS — E538 Deficiency of other specified B group vitamins: Secondary | ICD-10-CM | POA: Insufficient documentation

## 2019-08-19 DIAGNOSIS — R200 Anesthesia of skin: Secondary | ICD-10-CM | POA: Insufficient documentation

## 2019-08-19 DIAGNOSIS — Z87891 Personal history of nicotine dependence: Secondary | ICD-10-CM | POA: Diagnosis not present

## 2019-08-19 DIAGNOSIS — R790 Abnormal level of blood mineral: Secondary | ICD-10-CM

## 2019-08-19 DIAGNOSIS — Z803 Family history of malignant neoplasm of breast: Secondary | ICD-10-CM | POA: Insufficient documentation

## 2019-08-19 LAB — CBC WITH DIFFERENTIAL/PLATELET
Abs Immature Granulocytes: 0.02 10*3/uL (ref 0.00–0.07)
Basophils Absolute: 0 10*3/uL (ref 0.0–0.1)
Basophils Relative: 1 %
Eosinophils Absolute: 0.2 10*3/uL (ref 0.0–0.5)
Eosinophils Relative: 2 %
HCT: 36.8 % (ref 36.0–46.0)
Hemoglobin: 12.2 g/dL (ref 12.0–15.0)
Immature Granulocytes: 0 %
Lymphocytes Relative: 30 %
Lymphs Abs: 2.3 10*3/uL (ref 0.7–4.0)
MCH: 28.5 pg (ref 26.0–34.0)
MCHC: 33.2 g/dL (ref 30.0–36.0)
MCV: 86 fL (ref 80.0–100.0)
Monocytes Absolute: 0.6 10*3/uL (ref 0.1–1.0)
Monocytes Relative: 8 %
Neutro Abs: 4.4 10*3/uL (ref 1.7–7.7)
Neutrophils Relative %: 59 %
Platelets: 267 10*3/uL (ref 150–400)
RBC: 4.28 MIL/uL (ref 3.87–5.11)
RDW: 14.3 % (ref 11.5–15.5)
WBC: 7.5 10*3/uL (ref 4.0–10.5)
nRBC: 0 % (ref 0.0–0.2)

## 2019-08-19 LAB — VITAMIN B12: Vitamin B-12: 439 pg/mL (ref 180–914)

## 2019-08-20 NOTE — Progress Notes (Signed)
[facility]  [address] [place], [place] [address] Phone: [phone]  Fax: [phone]   Clinic Day:  08/21/2019  Referring physician: Juline Patch, MD  Chief Complaint: Lauren Boyd is a 39 y.o. female with iron deficiency and B12 deficiency who is seen for review of work-up and discussion regarding direction of therapy.   HPI:  The patient was last seen in the hematology clinic on 08/02/2019 for initial consultation. At that time, she notes menorrhagia x 1 year.  Diet appeared good.  She denied any melena, hematochezia or hematuria.  Symptomatically, she felt good.  She denied any fevers, sweats or weight loss.  She denied any reflux.  Exam was unremarkable.  Ferritin was 4 (low) and B12 was 149 (low) on 07/18/2019.  She was on oral B12.  Workup revealed a hematocrit of 35.6, hemoglobin 11.5, MCV 87.7, platelets 259,000, WBC 7200, ANC 4700. Iron saturation was 15% and a TIBC 391.  Intrinsic factor antibodies was 1.0 (normal) and anti-parietal cell antibody-IgG was 0.8 (normal).   She had a history of left sided numbness and tingling.  Head MRI on 08/05/2019 revealed no acute infarction, hemorrhage, hydrocephalus, extra-axial collection or mass lesion.   Labs on 08/19/2019 revealed a hematocrit of 36.8, hemoglobin 12.2, MCV 86.0, platelets 267,000, WBC 7500, ANC 4400. B12 was 439.   Symptomatically, she is doing 'good' today. She takes one oral iron pill a day (325 mg ferrous sulfate) with orange juice. She denies restless legs. Her nausea is still intermittent. The tingling and numbness on the left side of her face is improving. She hasn't experienced it since her last visit.    Past Medical History:  Diagnosis Date  . History of 2019 novel coronavirus disease (COVID-19) 06/20/2019    Past Surgical History:  Procedure Laterality Date  . CHOLECYSTECTOMY  2018  . HERNIA REPAIR    . TUBAL LIGATION      Family History  Problem Relation  Age of Onset  . Healthy Mother   . Healthy Father   . Breast cancer Maternal Grandmother 70    Social History:  reports that she has quit smoking. Her smoking use included cigarettes. She has never used smokeless tobacco. She reports current alcohol use. She reports previous drug use. She occasionally drinks alcohol. She denies use of tobacco. She denies any exposure to toxins or radiations. She is a Freight forwarder at [employer] in [place]. The patient is alone  today.   Allergies: No Known Allergies  Current Medications: Current Outpatient Medications  Medication Sig Dispense Refill  . cyanocobalamin 1000 MCG tablet Take by mouth.    . ferrous sulfate 324 MG TBEC Take 324 mg by mouth.    . Multiple Vitamin (MULTIVITAMIN) tablet Take 1 tablet by mouth daily.    Marland Kitchen VITAMIN D PO Take by mouth.    . fluticasone (FLONASE) 50 MCG/ACT nasal spray Place 2 sprays into both nostrils daily. (Patient not taking: Reported on 08/21/2019) 16 g 6  . ibuprofen (ADVIL) 200 MG tablet Take 200 mg by mouth every 6 (six) hours as needed.    . meloxicam (MOBIC) 15 MG tablet Take 1 tablet (15 mg total) by mouth daily. (Patient not taking: Reported on 08/21/2019) 30 tablet 3   No current facility-administered medications for this visit.    Review of Systems  Constitutional: Negative for chills, diaphoresis, fever, malaise/fatigue and weight loss (stable).       Feels "good".  HENT: Negative for congestion,  ear pain, hearing loss, nosebleeds, sinus pain, sore throat and tinnitus.   Eyes: Negative.  Negative for blurred vision, double vision and photophobia.  Respiratory: Negative.  Negative for cough, hemoptysis, sputum production and shortness of breath.   Cardiovascular: Negative.  Negative for chest pain, palpitations, orthopnea, leg swelling and PND.  Gastrointestinal: Positive for nausea (intermittent x 13 years). Negative for abdominal pain, blood in stool, constipation, diarrhea, heartburn, melena and  vomiting.  Genitourinary: Negative for dysuria, flank pain, frequency, hematuria and urgency.  Musculoskeletal: Positive for joint pain (right hip). Negative for back pain, falls, myalgias and neck pain.  Skin: Negative.  Negative for itching and rash.  Neurological: Positive for headaches (occasional sharp pain in left side of head). Negative for dizziness, tingling, tremors, sensory change (tingling and numbness on the left side of her face; improving ), speech change, focal weakness and weakness.       Occasional lightheadedness.   Endo/Heme/Allergies: Negative.  Negative for environmental allergies. Does not bruise/bleed easily.  Psychiatric/Behavioral: Negative.  Negative for depression. The patient is not nervous/anxious and does not have insomnia.    Performance status (ECOG):  0-1  Vitals Blood pressure 115/65, pulse 68, temperature (!) 96.6 F (35.9 C), temperature source Tympanic, resp. rate 16, weight 159 lb 6.3 oz (72.3 kg), SpO2 100 %.   Physical Exam Vitals and nursing note reviewed.  Constitutional:      General: She is not in acute distress.    Appearance: Normal appearance. She is well-developed and well-nourished. She is not diaphoretic.     Interventions: Face mask in place.  HENT:     Head: Normocephalic and atraumatic.      Comments: Shoulder length brown and blonde hair. Eyes:     General: No scleral icterus.    Extraocular Movements: EOM normal.     Conjunctiva/sclera: Conjunctivae normal.     Comments: Blue eyes.  Skin:    General: Skin is intact.     Findings: No bruising or lesion.     Comments: Tan.  Tattoos.  Neurological:     Mental Status: She is alert and oriented to person, place, and time.  Psychiatric:        Mood and Affect: Mood and affect normal.        Behavior: Behavior normal.        Thought Content: Thought content normal.        Judgment: Judgment normal.    Appointment on 08/19/2019  Component Date Value Ref Range Status  .  Vitamin B-12 08/19/2019 439  180 - 914 pg/mL Final   Comment: (NOTE) This assay is not validated for testing neonatal or myeloproliferative syndrome specimens for Vitamin B12 levels. Performed at [facility], [place] [address], [place], [place] [address]   . WBC 08/19/2019 7.5  4.0 - 10.5 K/uL Final  . RBC 08/19/2019 4.28  3.87 - 5.11 MIL/uL Final  . Hemoglobin 08/19/2019 12.2  12.0 - 15.0 g/dL Final  . HCT 08/19/2019 36.8  36.0 - 46.0 % Final  . MCV 08/19/2019 86.0  80.0 - 100.0 fL Final  . MCH 08/19/2019 28.5  26.0 - 34.0 pg Final  . MCHC 08/19/2019 33.2  30.0 - 36.0 g/dL Final  . RDW 08/19/2019 14.3  11.5 - 15.5 % Final  . Platelets 08/19/2019 267  150 - 400 K/uL Final  . nRBC 08/19/2019 0.0  0.0 - 0.2 % Final  . Neutrophils Relative % 08/19/2019 59  % Final  . Neutro  Abs 08/19/2019 4.4  1.7 - 7.7 K/uL Final  . Lymphocytes Relative 08/19/2019 30  % Final  . Lymphs Abs 08/19/2019 2.3  0.7 - 4.0 K/uL Final  . Monocytes Relative 08/19/2019 8  % Final  . Monocytes Absolute 08/19/2019 0.6  0.1 - 1.0 K/uL Final  . Eosinophils Relative 08/19/2019 2  % Final  . Eosinophils Absolute 08/19/2019 0.2  0.0 - 0.5 K/uL Final  . Basophils Relative 08/19/2019 1  % Final  . Basophils Absolute 08/19/2019 0.0  0.0 - 0.1 K/uL Final  . Immature Granulocytes 08/19/2019 0  % Final  . Abs Immature Granulocytes 08/19/2019 0.02  0.00 - 0.07 K/uL Final   Performed at [facility], [address] [place], [place] [address]    Assessment:  Lauren Boyd is a 39 y.o. female with a iron defciency and B12 deficiency.  She notes menorrhagia for the past year.  Diet appears good.  She denies any melena, hematochezia or hematuria.  She denies pica or restless legs.  Labs on 07/18/2019 included a hematocrit 37.1, hemoglobin 11.6, MCV 89.4, platelets 269,000, WBC 6200, ANC 3700.  Creatinine was 0.8 with normal LFTs. Ferritin was 4.  B12 was 149 (low) and folate 10.0.  Vitamin B1 and vitamin D,  25-hydroxy were normal.  TSH was 1.430.  Work-up on 08/02/2019 revealed a hematocrit of 35.6, hemoglobin 11.5, MCV 87.7, platelets 259,000, WBC 7200, ANC 4700. Iron saturation was 15% and a TIBC 391.  She has B12 deficiency.  B12 was 149 on 05/072021 and 439 on 08/19/2019.  She began oral B12 on 07/19/2019.   Intrinsic factor antibodies and anti-parietal cell antibody-IgG were normal on 08/02/2019.   She tested positive for COVID-19 on 06/19/2019.  Symptomatically, she is doing well.  Tingling in the left side of her face has improved.  She is taking ferrous sulfate 1 tablet a day.  Plan: 1.   Labs today: CBC with diff, B12. 2.   Iron deficiency  Hematocrit 36.8.  Hemoglobin 12.2.  MCV 86.0 on 08/19/2019.    Ferritin was 4 on 07/18/2019.  Ferritin goal is 100.  She has iron deficiency secondary to heavy menses.  Diet appears good.  She denies any other bleeding.    Continue ferrous sulfate 125 mg p.o. daily with orange juice or vitamin C. 3.   B12 deficiency  B12 was 149 (low) on 07/19/2019.  She began oral B12.  B12 is 439 on 08/19/2019.  B12 goal is 400.    Pernicious anemia work-up was negative.  Continue oral B12. 4.   Menorrhagia  Patient to call with heavy menses for von Willebrand testing. 5.   RTC in 3 months for labs (CBC, ferritin). 6.   RTC in 6 months for MD assessment and labs (CBC with diff, ferritin, iron studies).  I discussed the assessment and treatment plan with the patient.  The patient was provided an opportunity to ask questions and all were answered.  The patient agreed with the plan and demonstrated an understanding of the instructions.  The patient was advised to call back if the symptoms worsen or if the condition fails to improve as anticipated.   Dutch Ing C. Mike Gip, MD, PhD    08/21/2019, 3:50 PM  I, Heywood Footman, am acting as Education administrator for [employer]. Mike Gip, MD, PhD.  I, Donzella Carrol C. Mike Gip, MD, have reviewed the above documentation for accuracy and  completeness, and I agree with the above.

## 2019-08-21 ENCOUNTER — Other Ambulatory Visit: Payer: Self-pay

## 2019-08-21 ENCOUNTER — Inpatient Hospital Stay (HOSPITAL_BASED_OUTPATIENT_CLINIC_OR_DEPARTMENT_OTHER): Payer: Medicaid Other | Admitting: Hematology and Oncology

## 2019-08-21 ENCOUNTER — Encounter: Payer: Self-pay | Admitting: Hematology and Oncology

## 2019-08-21 VITALS — BP 115/65 | HR 68 | Temp 96.6°F | Resp 16 | Wt 159.4 lb

## 2019-08-21 DIAGNOSIS — Z803 Family history of malignant neoplasm of breast: Secondary | ICD-10-CM | POA: Diagnosis not present

## 2019-08-21 DIAGNOSIS — E538 Deficiency of other specified B group vitamins: Secondary | ICD-10-CM | POA: Diagnosis not present

## 2019-08-21 DIAGNOSIS — R200 Anesthesia of skin: Secondary | ICD-10-CM | POA: Diagnosis not present

## 2019-08-21 DIAGNOSIS — E611 Iron deficiency: Secondary | ICD-10-CM | POA: Diagnosis not present

## 2019-08-21 DIAGNOSIS — N920 Excessive and frequent menstruation with regular cycle: Secondary | ICD-10-CM | POA: Diagnosis not present

## 2019-08-21 DIAGNOSIS — Z8616 Personal history of COVID-19: Secondary | ICD-10-CM | POA: Diagnosis not present

## 2019-08-21 DIAGNOSIS — R202 Paresthesia of skin: Secondary | ICD-10-CM | POA: Diagnosis not present

## 2019-08-21 DIAGNOSIS — Z87891 Personal history of nicotine dependence: Secondary | ICD-10-CM | POA: Diagnosis not present

## 2019-11-08 ENCOUNTER — Ambulatory Visit: Payer: Medicaid Other | Admitting: Family Medicine

## 2019-11-12 ENCOUNTER — Ambulatory Visit (INDEPENDENT_AMBULATORY_CARE_PROVIDER_SITE_OTHER): Payer: Medicaid Other | Admitting: Family Medicine

## 2019-11-12 ENCOUNTER — Other Ambulatory Visit: Payer: Self-pay

## 2019-11-12 ENCOUNTER — Encounter: Payer: Self-pay | Admitting: Family Medicine

## 2019-11-12 VITALS — BP 138/80 | HR 80 | Ht 64.0 in | Wt 161.0 lb

## 2019-11-12 DIAGNOSIS — M546 Pain in thoracic spine: Secondary | ICD-10-CM | POA: Diagnosis not present

## 2019-11-12 DIAGNOSIS — M545 Low back pain, unspecified: Secondary | ICD-10-CM

## 2019-11-12 DIAGNOSIS — G8929 Other chronic pain: Secondary | ICD-10-CM | POA: Diagnosis not present

## 2019-11-12 MED ORDER — MELOXICAM 15 MG PO TABS: 15.0000 mg | ORAL_TABLET | Freq: Every day | ORAL | 1 refills | Status: DC
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
## 2019-11-20 DIAGNOSIS — S24109A Unspecified injury at unspecified level of thoracic spinal cord, initial encounter: Secondary | ICD-10-CM | POA: Diagnosis not present

## 2019-11-20 DIAGNOSIS — M546 Pain in thoracic spine: Secondary | ICD-10-CM | POA: Diagnosis not present

## 2019-11-20 DIAGNOSIS — G8929 Other chronic pain: Secondary | ICD-10-CM | POA: Diagnosis not present

## 2019-11-20 DIAGNOSIS — S239XXA Sprain of unspecified parts of thorax, initial encounter: Secondary | ICD-10-CM | POA: Diagnosis not present

## 2019-11-21 ENCOUNTER — Other Ambulatory Visit: Payer: Self-pay | Admitting: Orthopedic Surgery

## 2019-11-21 ENCOUNTER — Other Ambulatory Visit: Payer: Self-pay

## 2019-11-21 ENCOUNTER — Inpatient Hospital Stay: Payer: Medicaid Other | Attending: Hematology and Oncology

## 2019-11-21 DIAGNOSIS — E611 Iron deficiency: Secondary | ICD-10-CM | POA: Insufficient documentation

## 2019-11-21 DIAGNOSIS — M546 Pain in thoracic spine: Secondary | ICD-10-CM

## 2019-11-21 DIAGNOSIS — E538 Deficiency of other specified B group vitamins: Secondary | ICD-10-CM

## 2019-11-21 DIAGNOSIS — S24109A Unspecified injury at unspecified level of thoracic spinal cord, initial encounter: Secondary | ICD-10-CM

## 2019-11-21 LAB — CBC WITH DIFFERENTIAL/PLATELET
Abs Immature Granulocytes: 0.02 10*3/uL (ref 0.00–0.07)
Basophils Absolute: 0.1 10*3/uL (ref 0.0–0.1)
Basophils Relative: 1 %
Eosinophils Absolute: 0.1 10*3/uL (ref 0.0–0.5)
Eosinophils Relative: 2 %
HCT: 37.4 % (ref 36.0–46.0)
Hemoglobin: 12.4 g/dL (ref 12.0–15.0)
Immature Granulocytes: 0 %
Lymphocytes Relative: 27 %
Lymphs Abs: 1.8 10*3/uL (ref 0.7–4.0)
MCH: 29.5 pg (ref 26.0–34.0)
MCHC: 33.2 g/dL (ref 30.0–36.0)
MCV: 88.8 fL (ref 80.0–100.0)
Monocytes Absolute: 0.5 10*3/uL (ref 0.1–1.0)
Monocytes Relative: 8 %
Neutro Abs: 4.3 10*3/uL (ref 1.7–7.7)
Neutrophils Relative %: 62 %
Platelets: 271 10*3/uL (ref 150–400)
RBC: 4.21 MIL/uL (ref 3.87–5.11)
RDW: 12.6 % (ref 11.5–15.5)
WBC: 6.8 10*3/uL (ref 4.0–10.5)
nRBC: 0 % (ref 0.0–0.2)

## 2019-11-21 LAB — FERRITIN: Ferritin: 30 ng/mL (ref 11–307)

## 2019-12-09 ENCOUNTER — Other Ambulatory Visit: Payer: Self-pay

## 2019-12-09 ENCOUNTER — Ambulatory Visit
Admission: RE | Admit: 2019-12-09 | Discharge: 2019-12-09 | Disposition: A | Discharge status: Home / Self Care | Payer: Medicaid Other | Source: Ambulatory Visit | Attending: Orthopedic Surgery | Admitting: Orthopedic Surgery

## 2019-12-09 DIAGNOSIS — G8929 Other chronic pain: Secondary | ICD-10-CM

## 2019-12-09 DIAGNOSIS — M546 Pain in thoracic spine: Secondary | ICD-10-CM

## 2019-12-09 DIAGNOSIS — S24109A Unspecified injury at unspecified level of thoracic spinal cord, initial encounter: Secondary | ICD-10-CM
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
## 2019-12-10 ENCOUNTER — Encounter: Payer: Self-pay | Admitting: Hematology and Oncology

## 2020-01-01 ENCOUNTER — Other Ambulatory Visit: Payer: Self-pay | Admitting: Gastroenterology

## 2020-01-01 DIAGNOSIS — R1013 Epigastric pain: Secondary | ICD-10-CM | POA: Diagnosis not present

## 2020-01-01 DIAGNOSIS — R079 Chest pain, unspecified: Secondary | ICD-10-CM | POA: Diagnosis not present

## 2020-01-01 DIAGNOSIS — R110 Nausea: Secondary | ICD-10-CM | POA: Diagnosis not present

## 2020-01-07 ENCOUNTER — Ambulatory Visit
Admission: RE | Admit: 2020-01-07 | Discharge: 2020-01-07 | Disposition: A | Discharge status: Home / Self Care | Payer: Medicaid Other | Source: Ambulatory Visit | Attending: Gastroenterology | Admitting: Gastroenterology

## 2020-01-07 ENCOUNTER — Other Ambulatory Visit: Payer: Self-pay

## 2020-01-07 DIAGNOSIS — Z9049 Acquired absence of other specified parts of digestive tract: Secondary | ICD-10-CM | POA: Diagnosis not present

## 2020-01-07 DIAGNOSIS — R1013 Epigastric pain: Secondary | ICD-10-CM

## 2020-01-07 IMAGING — US US ABDOMEN COMPLETE
1 series · 14 of 25 positions shown · non-contrast
Comparison: None.

CLINICAL DATA: Epigastric pain

EXAM:
ABDOMEN ULTRASOUND COMPLETE

[Series 1: us abdomen complete · 0.25mm/px · 14 of 83 slices shown]
[im 1/83]
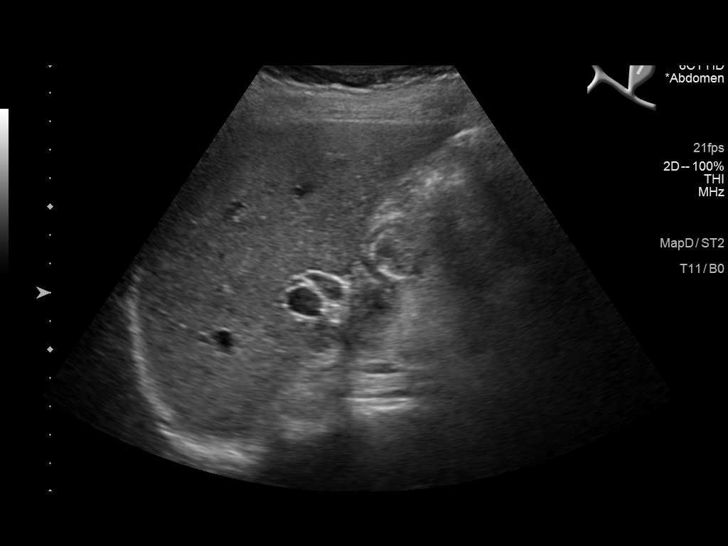
[im 7/83]
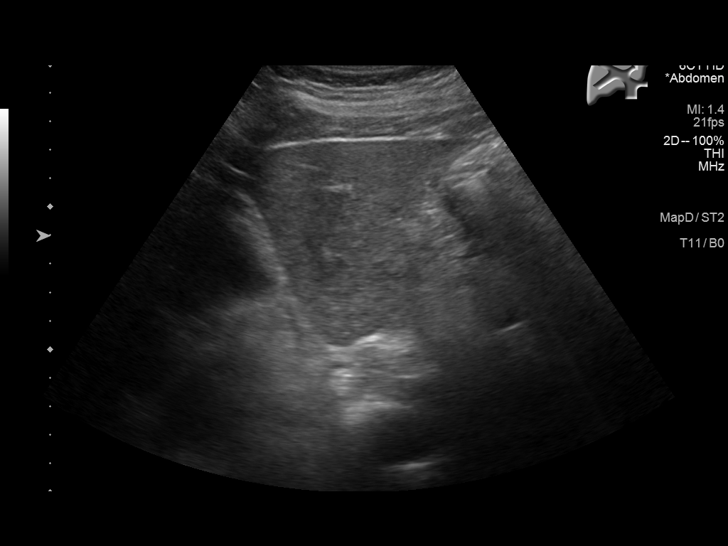
[im 14/83]
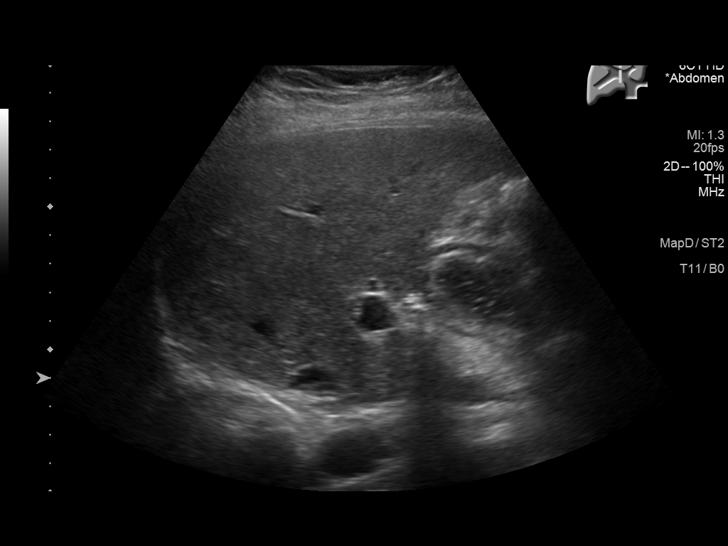
[im 21/83]
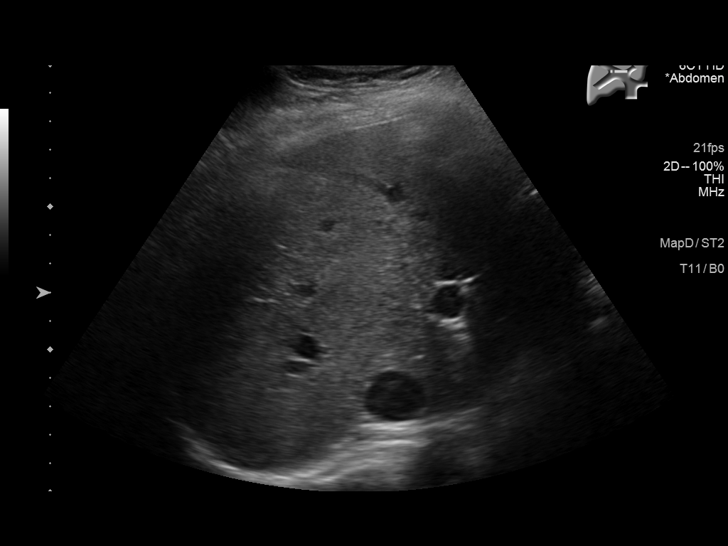
[im 28/83]
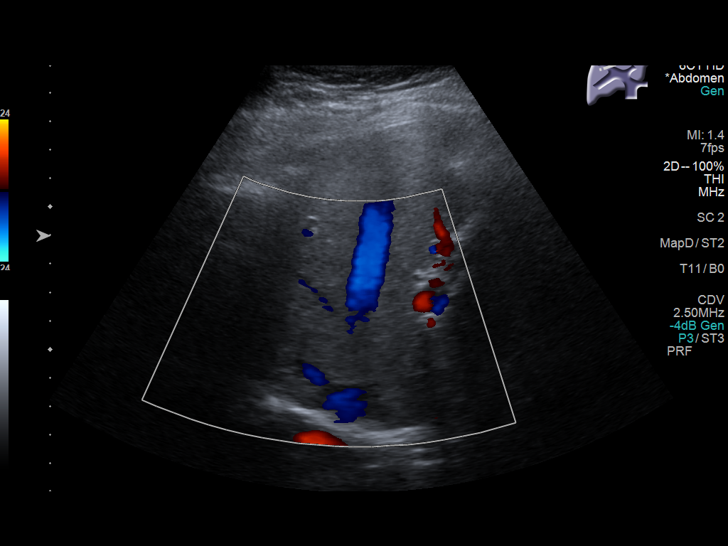
[im 31/83]
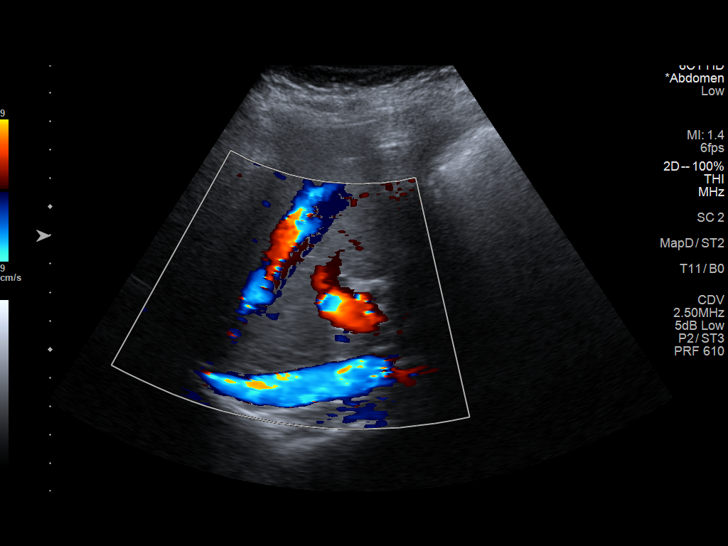
[im 38/83]
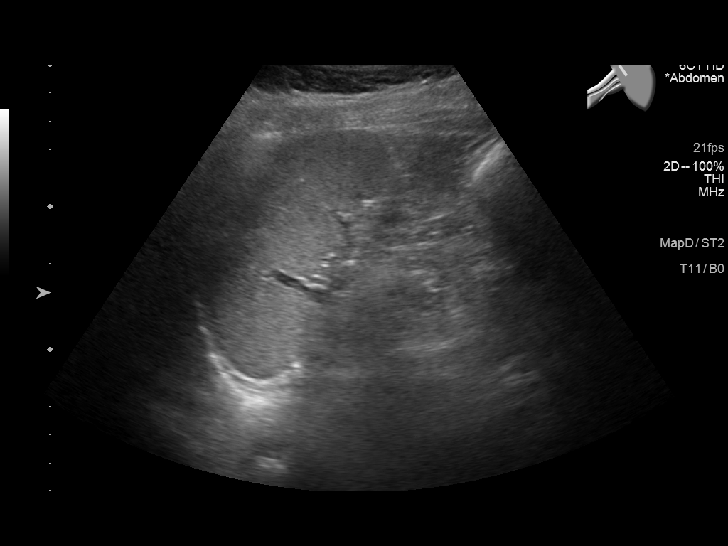
[im 45/83]
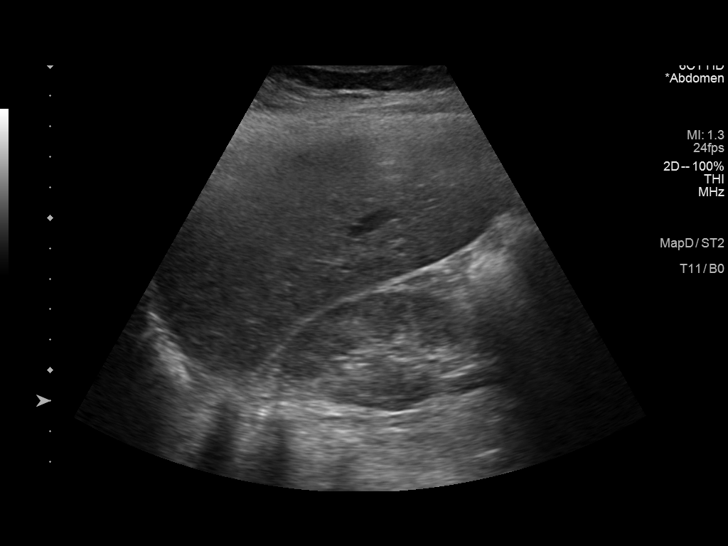
[im 52/83]
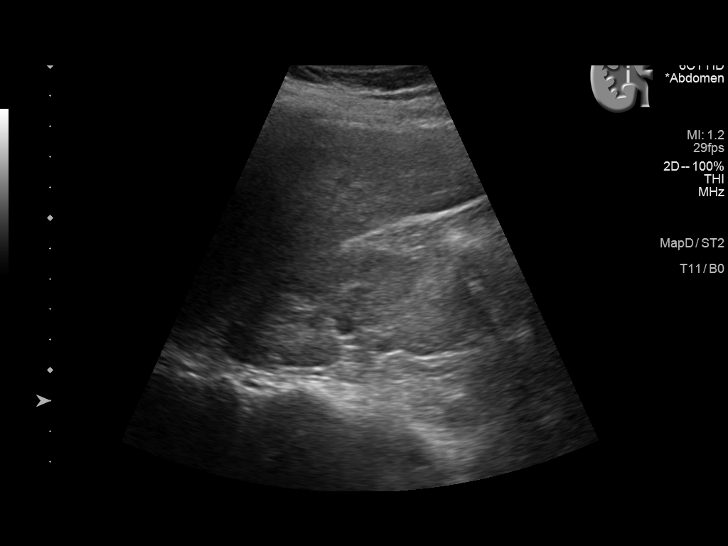
[im 55/83]
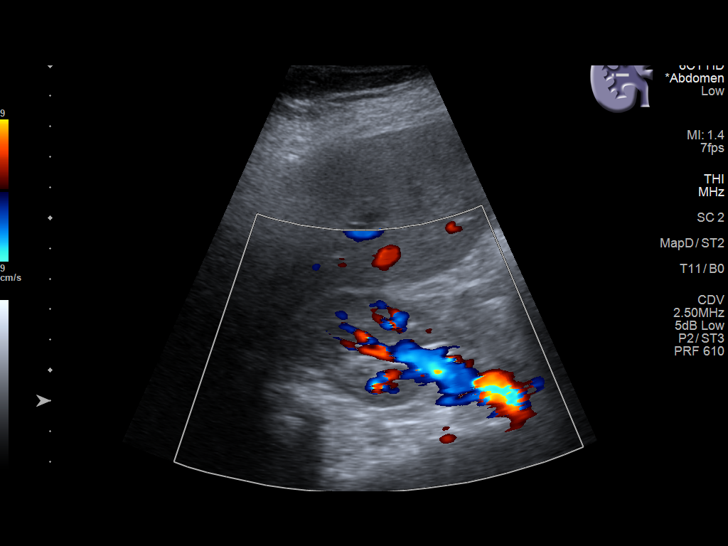
[im 62/83]
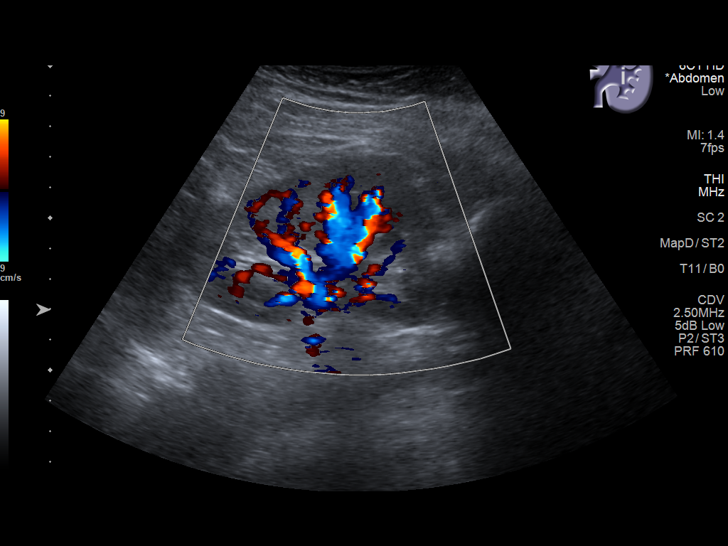
[im 69/83]
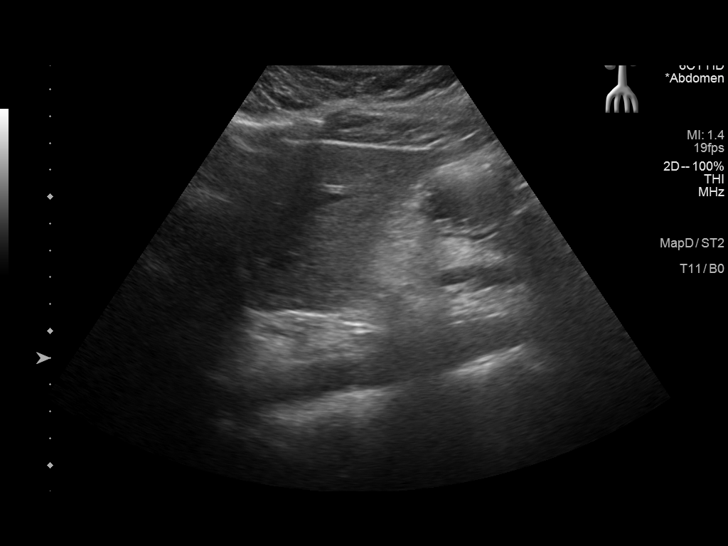
[im 76/83]
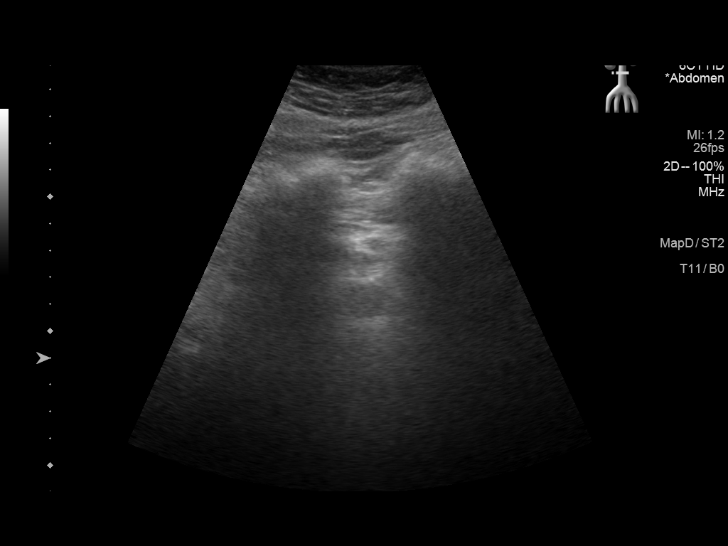
[im 83/83]
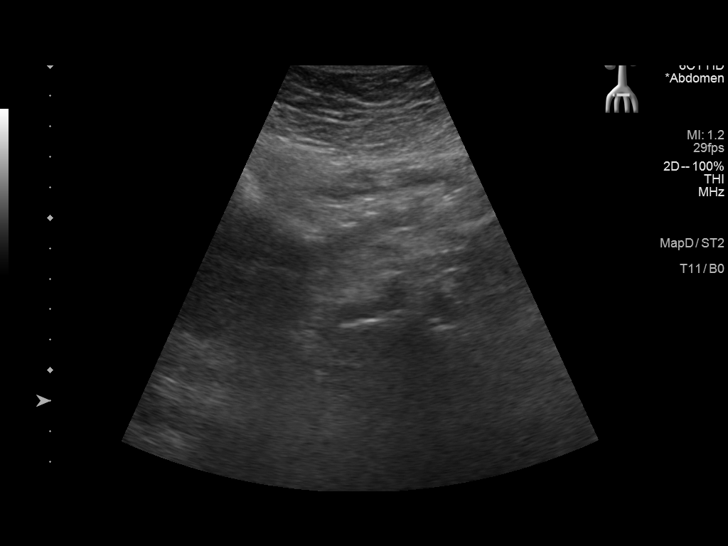

[14 of 25 positions shown; findings below may reference images not displayed]

FINDINGS: Gallbladder: Surgically absent

Common bile duct: Diameter: 6.2 mm

Liver: No focal lesion identified. Within normal limits in
parenchymal echogenicity. Portal vein is patent on color Doppler
imaging with normal direction of blood flow towards the liver.

IVC: No abnormality visualized.

Pancreas: Visualized portion unremarkable.

Spleen: Size and appearance within normal limits.

Right Kidney: Length: 9.6 cm. Echogenicity within normal limits. No
mass or hydronephrosis visualized.

Left Kidney: Length: 10.6 cm. Echogenicity within normal limits. No
mass or hydronephrosis visualized.

Abdominal aorta: No aneurysm visualized.

Other findings: None.
IMPRESSION: Status post cholecystectomy. Otherwise negative abdominal
ultrasound.

## 2020-01-16 DIAGNOSIS — R002 Palpitations: Secondary | ICD-10-CM | POA: Diagnosis not present

## 2020-01-16 DIAGNOSIS — R079 Chest pain, unspecified: Secondary | ICD-10-CM | POA: Diagnosis not present

## 2020-01-16 DIAGNOSIS — R0609 Other forms of dyspnea: Secondary | ICD-10-CM | POA: Diagnosis not present

## 2020-01-17 ENCOUNTER — Other Ambulatory Visit: Payer: Self-pay | Admitting: Student

## 2020-01-17 DIAGNOSIS — R079 Chest pain, unspecified: Secondary | ICD-10-CM

## 2020-01-17 DIAGNOSIS — R0609 Other forms of dyspnea: Secondary | ICD-10-CM

## 2020-01-27 ENCOUNTER — Ambulatory Visit
Admission: RE | Admit: 2020-01-27 | Discharge: 2020-01-27 | Disposition: A | Discharge status: Home / Self Care | Payer: Medicaid Other | Source: Ambulatory Visit | Attending: Student | Admitting: Student

## 2020-01-27 ENCOUNTER — Other Ambulatory Visit: Payer: Self-pay

## 2020-01-27 DIAGNOSIS — R0609 Other forms of dyspnea: Secondary | ICD-10-CM | POA: Diagnosis present

## 2020-01-27 DIAGNOSIS — R079 Chest pain, unspecified: Secondary | ICD-10-CM

## 2020-01-27 IMAGING — CT CT CHEST W/O CM
1 of 2 series · 14 of 32 positions shown, 18 images · non-contrast
Comparison: None.

CLINICAL DATA: 39-year-old female with intermittent chest pain

EXAM:
CT CHEST WITHOUT CONTRAST
TECHNIQUE: Multidetector CT imaging of the chest was performed following the
standard protocol without IV contrast.

[Series 2: thorax · axial · 0.57mm/px · z∈[-536,-290]mm · 14 of 147 slices shown, 18 images]
[im 12/147  mediastinal]
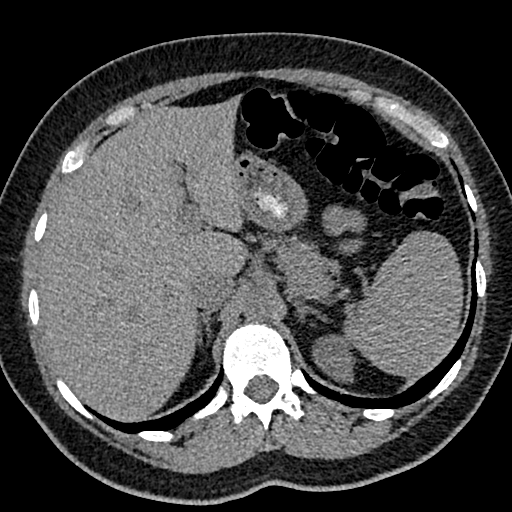
[im 12/147  lung]
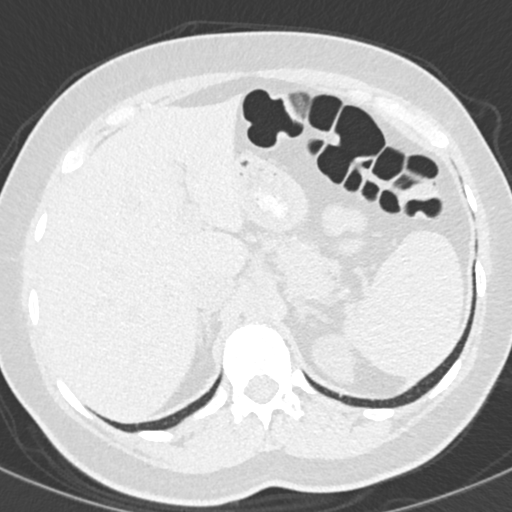
[im 23/147  lung]
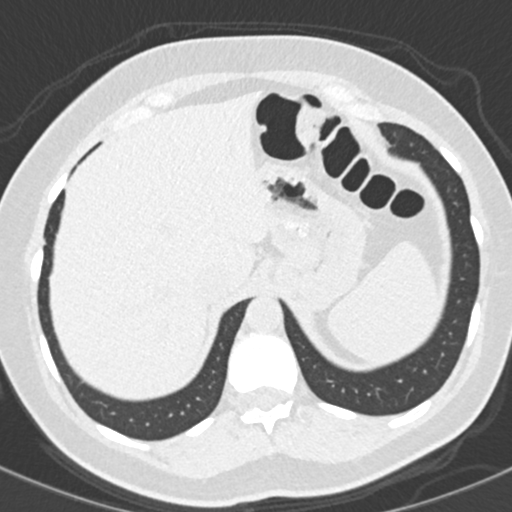
[im 34/147  lung]
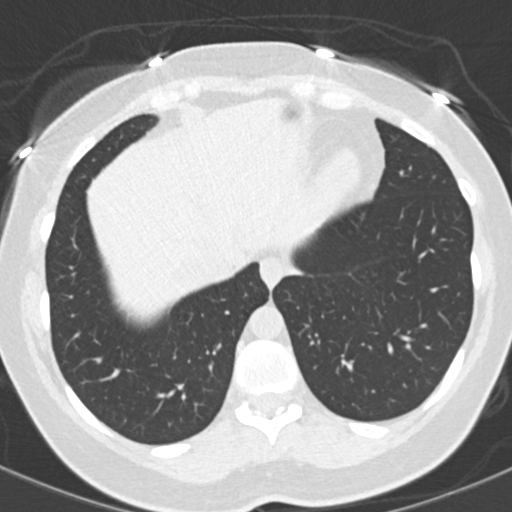
[im 45/147  lung]
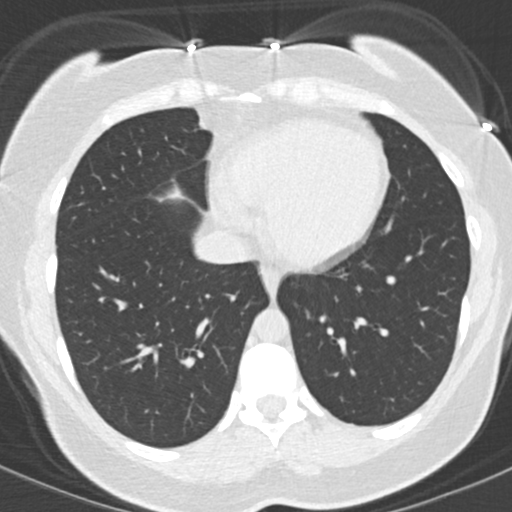
[im 57/147  mediastinal]
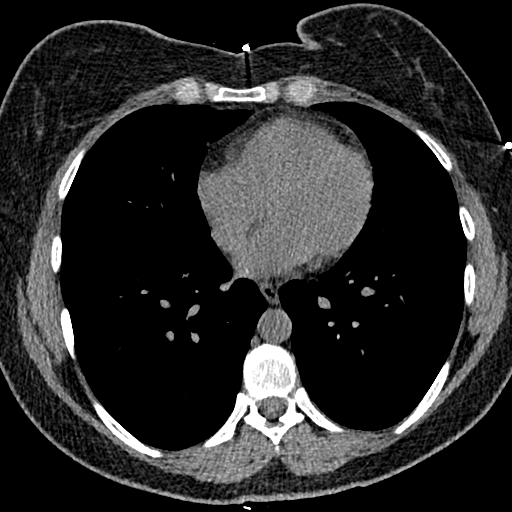
[im 57/147  lung]
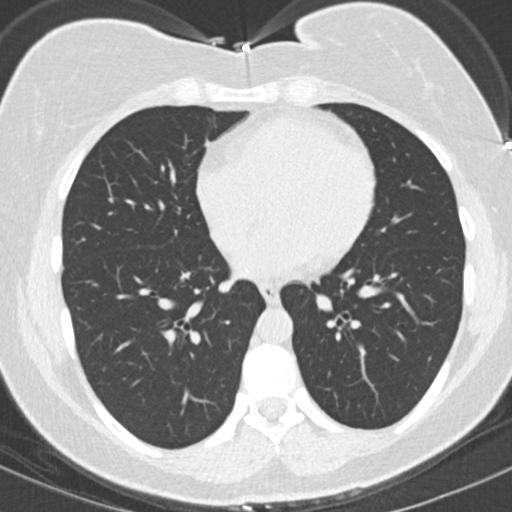
[im 68/147  lung]
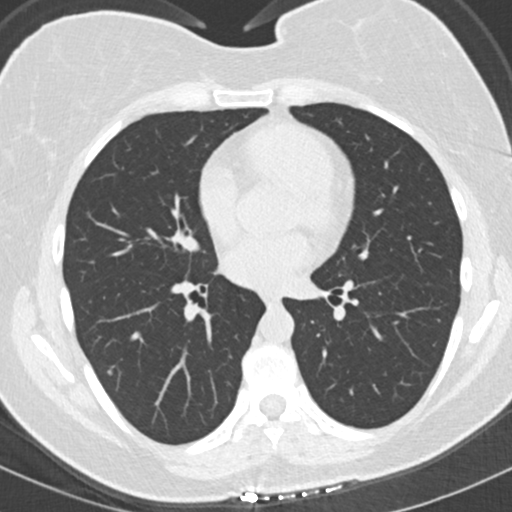
[im 69/147  lung]
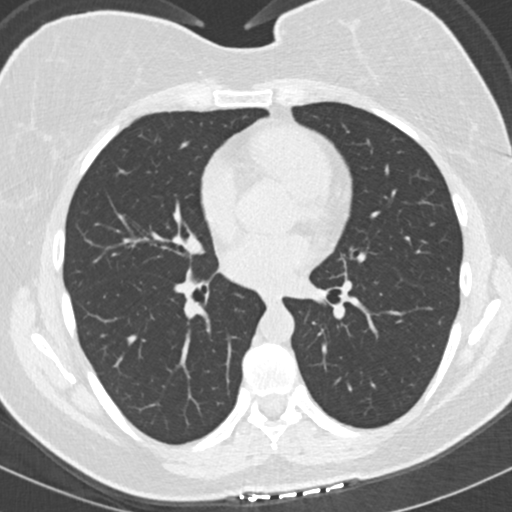
[im 74/147  lung]
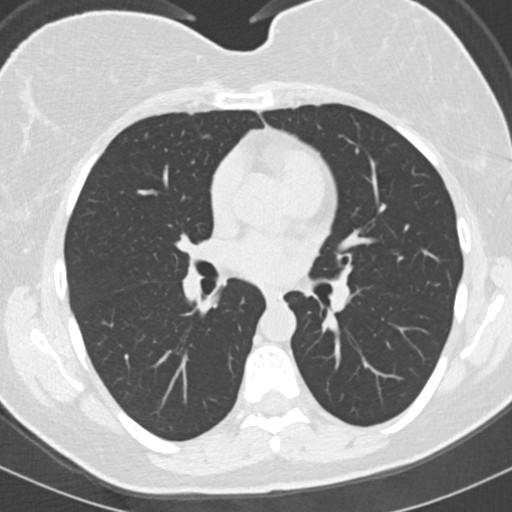
[im 79/147  mediastinal]
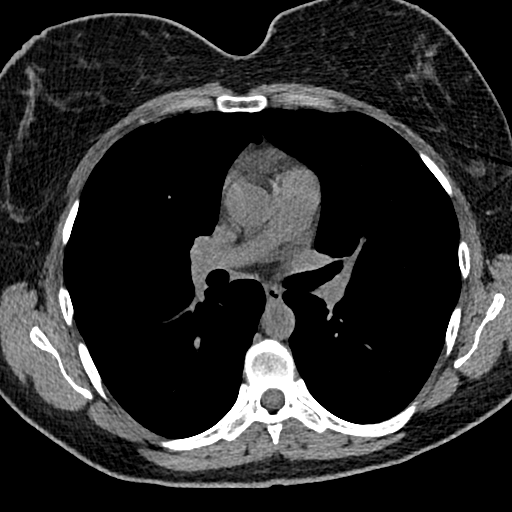
[im 79/147  lung]
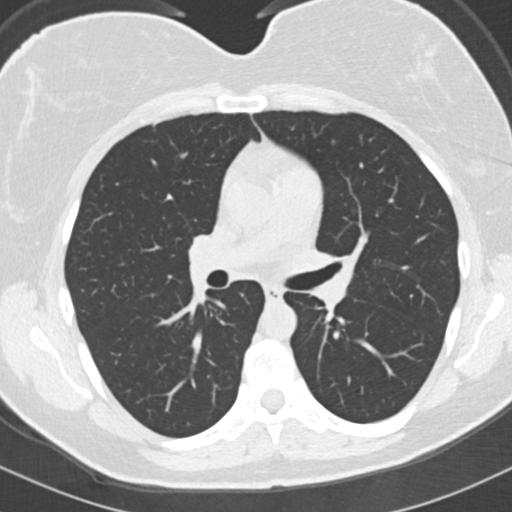
[im 90/147  lung]
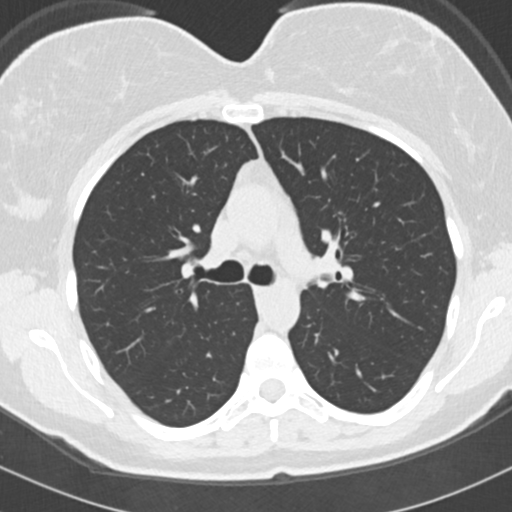
[im 102/147  lung]
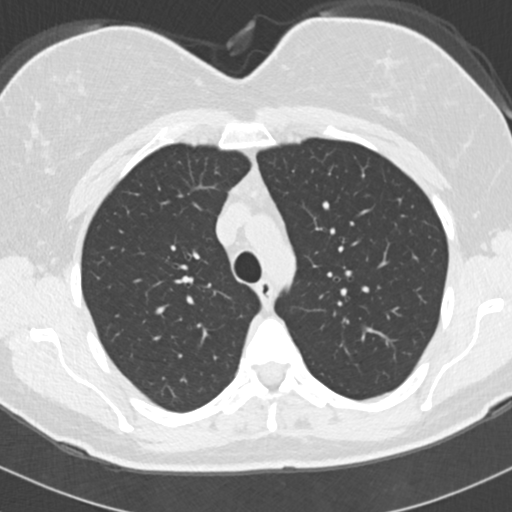
[im 113/147  lung]
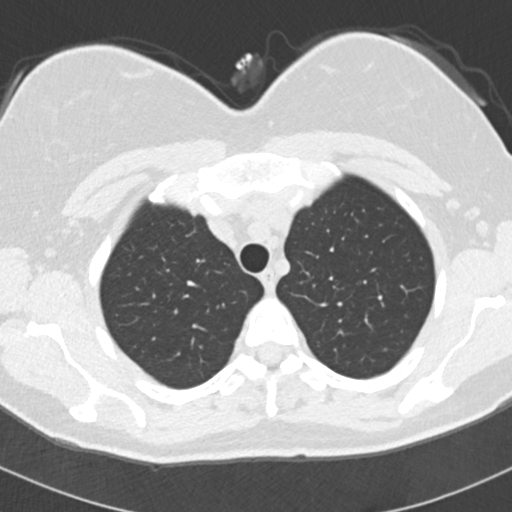
[im 124/147  mediastinal]
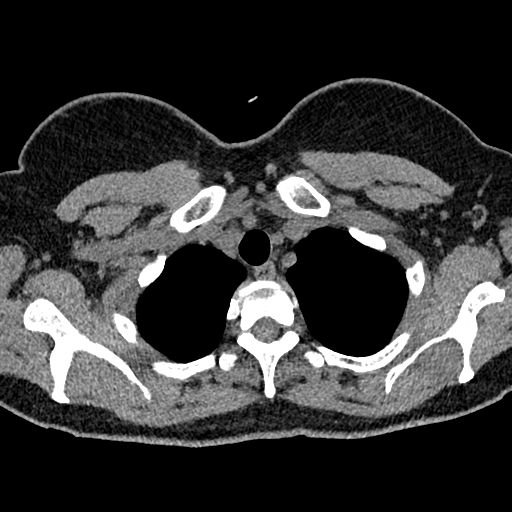
[im 124/147  lung]
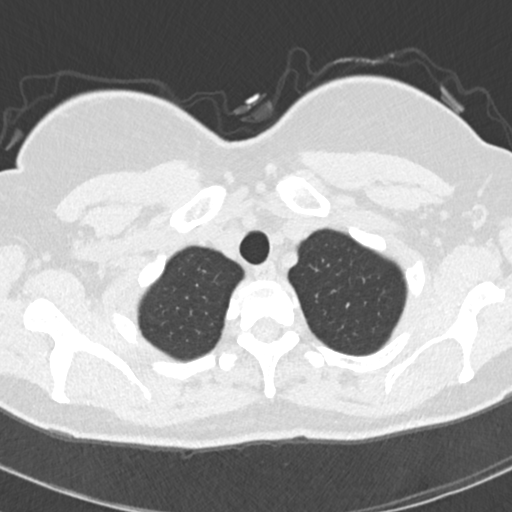
[im 135/147  lung]
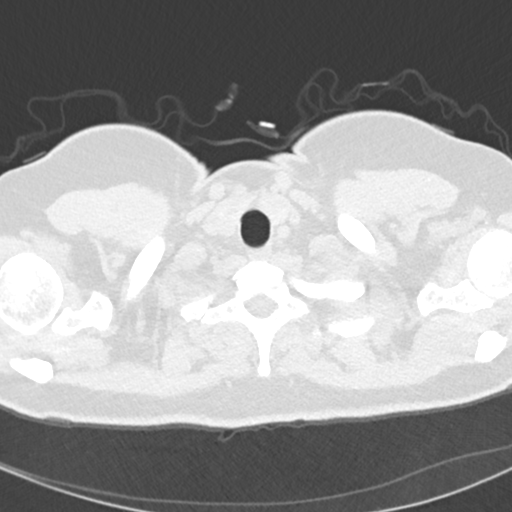

[14 of 32 positions shown; findings below may reference images not displayed]

FINDINGS: Cardiovascular: Heart within normal limits. No pericardial
fluid/thickening. No significant atherosclerotic calcifications of
the coronary arteries. Normal course caliber and contour of the
thoracic aorta. Unremarkable pulmonary artery diameter.

Mediastinum/Nodes: No mediastinal adenopathy. Unremarkable thoracic
esophagus. Unremarkable thoracic inlet

Lungs/Pleura: Lungs are clear. No pleural effusion or pneumothorax.

Upper Abdomen: No acute finding of the upper abdomen.

Musculoskeletal: Unremarkable
IMPRESSION: Negative for acute abnormality.

## 2020-02-11 DIAGNOSIS — R079 Chest pain, unspecified: Secondary | ICD-10-CM | POA: Diagnosis not present

## 2020-02-11 DIAGNOSIS — R0609 Other forms of dyspnea: Secondary | ICD-10-CM | POA: Diagnosis not present

## 2020-02-19 NOTE — Progress Notes (Deleted)
[facility]  [address] [place], [place] [address] Phone: [phone]  Fax: [phone]   Clinic Day:  02/19/2020  Referring physician: Juline Patch, MD  Chief Complaint: Lauren Boyd is a 39 y.o. female with iron deficiency and B12 deficiency who is seen for 6 month assessment.  HPI:  The patient was last seen in the hematology clinic on 08/21/2019 for initial consultation. At that time she is doing well.  Tingling on the left side of her face had improved.  She was taking ferrous sulfate 1 tablet a day. Hematocrit was 36.8, hemoglobin 12.2, platelets 267,000, WBC 7,500. Vitamin B12 was 439. She continued oral iron and B12. She was to call with heavy menses for von Willebrand testing.  The patient saw Gladstone Pih, NP in cardiology, on 01/16/2020. She reported chest pain x 1 year and palpitations. Her EKG revealed a normal sinus rhythm with sinus arrhythmia and a ventricular rate of 72 bpm. She was prescribed omeprazole. Echo revealed an EF of 50%. Chest CT without contrast was negative.  Labs on 11/21/2019 showed a hematocrit of 37.4, hemoglobin 12.4, platelets 271,000, WBC 6,800. Ferritin was 30.  During the interim, ***   Past Medical History:  Diagnosis Date  . History of 2019 novel coronavirus disease (COVID-19) 06/20/2019    Past Surgical History:  Procedure Laterality Date  . CHOLECYSTECTOMY  2018  . HERNIA REPAIR    . TUBAL LIGATION      Family History  Problem Relation Age of Onset  . Healthy Mother   . Healthy Father   . Breast cancer Maternal Grandmother 70    Social History:  reports that she has quit smoking. Her smoking use included cigarettes. She has never used smokeless tobacco. She reports current alcohol use. She reports previous drug use. She occasionally drinks alcohol. She denies use of tobacco. She denies any exposure to toxins or radiations. She is a Freight forwarder at [employer] in [place]. The patient is  alone *** today.   Allergies: No Known Allergies  Current Medications: Current Outpatient Medications  Medication Sig Dispense Refill  . cyanocobalamin 1000 MCG tablet Take by mouth.    . ferrous sulfate 324 MG TBEC Take 324 mg by mouth.    Marland Kitchen ibuprofen (ADVIL) 200 MG tablet Take 200 mg by mouth every 6 (six) hours as needed.    . meloxicam (MOBIC) 15 MG tablet Take 1 tablet (15 mg total) by mouth daily. 30 tablet 1  . Multiple Vitamin (MULTIVITAMIN) tablet Take 1 tablet by mouth daily.    Marland Kitchen VITAMIN D PO Take by mouth.     No current facility-administered medications for this visit.    Review of Systems  Constitutional: Negative for chills, diaphoresis, fever, malaise/fatigue and weight loss (stable).       Feels "good".  HENT: Negative for congestion, ear pain, hearing loss, nosebleeds, sinus pain, sore throat and tinnitus.   Eyes: Negative.  Negative for blurred vision, double vision and photophobia.  Respiratory: Negative.  Negative for cough, hemoptysis, sputum production and shortness of breath.   Cardiovascular: Negative.  Negative for chest pain, palpitations, orthopnea, leg swelling and PND.  Gastrointestinal: Positive for nausea (intermittent x 13 years). Negative for abdominal pain, blood in stool, constipation, diarrhea, heartburn, melena and vomiting.  Genitourinary: Negative for dysuria, flank pain, frequency, hematuria and urgency.  Musculoskeletal: Positive for joint pain (right hip). Negative for back pain, falls, myalgias and neck pain.  Skin: Negative.  Negative for  itching and rash.  Neurological: Positive for headaches (occasional sharp pain in left side of head). Negative for dizziness, tingling, tremors, sensory change (tingling and numbness on the left side of her face; improving ), speech change, focal weakness and weakness.       Occasional lightheadedness.   Endo/Heme/Allergies: Negative.  Negative for environmental allergies. Does not bruise/bleed easily.   Psychiatric/Behavioral: Negative.  Negative for depression. The patient is not nervous/anxious and does not have insomnia.    Performance status (ECOG):  0-1***  Vitals There were no vitals taken for this visit.   Physical Exam Vitals and nursing note reviewed.  Constitutional:      General: She is not in acute distress.    Appearance: Normal appearance. She is well-developed. She is not diaphoretic.     Interventions: Face mask in place.  HENT:     Head: Normocephalic and atraumatic.  Eyes:     General: No scleral icterus.    Conjunctiva/sclera: Conjunctivae normal.     Comments: Blue eyes.  Skin:    Findings: No bruising or lesion.     Comments: Tan.  Tattoos.  Neurological:     Mental Status: She is alert and oriented to person, place, and time.  Psychiatric:        Behavior: Behavior normal.        Thought Content: Thought content normal.        Judgment: Judgment normal.    No visits with results within 3 Day(s) from this visit.  Latest known visit with results is:  Appointment on 11/21/2019  Component Date Value Ref Range Status  . Ferritin 11/21/2019 30  11 - 307 ng/mL Final   Performed at [facility], [address] [place], [place] [address]  . WBC 11/21/2019 6.8  4.0 - 10.5 K/uL Final  . RBC 11/21/2019 4.21  3.87 - 5.11 MIL/uL Final  . Hemoglobin 11/21/2019 12.4  12.0 - 15.0 g/dL Final  . HCT 11/21/2019 37.4  36 - 46 % Final  . MCV 11/21/2019 88.8  80.0 - 100.0 fL Final  . MCH 11/21/2019 29.5  26.0 - 34.0 pg Final  . MCHC 11/21/2019 33.2  30.0 - 36.0 g/dL Final  . RDW 11/21/2019 12.6  11.5 - 15.5 % Final  . Platelets 11/21/2019 271  150 - 400 K/uL Final  . nRBC 11/21/2019 0.0  0.0 - 0.2 % Final  . Neutrophils Relative % 11/21/2019 62  % Final  . Neutro Abs 11/21/2019 4.3  1.7 - 7.7 K/uL Final  . Lymphocytes Relative 11/21/2019 27  % Final  . Lymphs Abs 11/21/2019 1.8  0.7 - 4.0 K/uL Final  . Monocytes Relative 11/21/2019 8  % Final  .  Monocytes Absolute 11/21/2019 0.5  0.1 - 1.0 K/uL Final  . Eosinophils Relative 11/21/2019 2  % Final  . Eosinophils Absolute 11/21/2019 0.1  0.0 - 0.5 K/uL Final  . Basophils Relative 11/21/2019 1  % Final  . Basophils Absolute 11/21/2019 0.1  0.0 - 0.1 K/uL Final  . Immature Granulocytes 11/21/2019 0  % Final  . Abs Immature Granulocytes 11/21/2019 0.02  0.00 - 0.07 K/uL Final   Performed at [facility], [address], [place], [place] [address]    Assessment:  Lauren Boyd is a 39 y.o. female with a iron deficiency and B12 deficiency.  She notes menorrhagia for the past year.  Diet appears good.  She denies any melena, hematochezia or hematuria.  She denies pica or  restless legs.  Labs on 07/18/2019 included a hematocrit 37.1, hemoglobin 11.6, MCV 89.4, platelets 269,000, WBC 6200, ANC 3700.  Creatinine was 0.8 with normal LFTs. Ferritin was 4.  B12 was 149 (low) and folate 10.0.  Vitamin B1 and vitamin D, 25-hydroxy were normal.  TSH was 1.430.  Work-up on 08/02/2019 revealed a hematocrit of 35.6, hemoglobin 11.5, MCV 87.7, platelets 259,000, WBC 7200, ANC 4700. Iron saturation was 15% and a TIBC 391.  Ferritin has been followed:  She has B12 deficiency.  B12 was 149 on 05/072021 and 439 on 08/19/2019.  She began oral B12 on 07/19/2019.   Intrinsic factor antibodies and anti-parietal cell antibody-IgG were normal on 08/02/2019.   She tested positive for COVID-19 on 06/19/2019.  Symptomatically, ***  Plan: 1.   Labs today: CBC with diff, ferritin, iron studies   2.   Iron deficiency  Hematocrit 36.8.  Hemoglobin 12.2.  MCV 86.0 on 08/19/2019.    Ferritin was 4 on 07/18/2019.  Ferritin goal is 100.  She has iron deficiency secondary to heavy menses.  Diet appears good.  She denies any other bleeding.    Continue ferrous sulfate 125 mg p.o. daily with orange juice or vitamin C. 3.   B12 deficiency  B12 was 149 (low) on 07/19/2019.  She began oral B12.  B12 is  439 on 08/19/2019.  B12 goal is 400.    Pernicious anemia work-up was negative.  Continue oral B12. 4.   Menorrhagia  Patient to call with heavy menses for von Willebrand testing. 5.   RTC in 3 months for labs (CBC, ferritin). 6.   RTC in 6 months for MD assessment and labs (CBC with diff, ferritin, iron studies).  I discussed the assessment and treatment plan with the patient.  The patient was provided an opportunity to ask questions and all were answered.  The patient agreed with the plan and demonstrated an understanding of the instructions.  The patient was advised to call back if the symptoms worsen or if the condition fails to improve as anticipated.  I provided *** minutes of face-to-face time during this this encounter and > 50% was spent counseling as documented under my assessment and plan.  Antwone Capozzoli C. Mike Gip, MD, PhD    02/19/2020, 3:50 PM  I, Mirian Mo Tufford, am acting as Education administrator for [employer]. Mike Gip, MD, PhD.  I, Shantika Bermea C. Mike Gip, MD, have reviewed the above documentation for accuracy and completeness, and I agree with the above.

## 2020-02-20 ENCOUNTER — Inpatient Hospital Stay: Payer: Medicaid Other

## 2020-02-20 ENCOUNTER — Inpatient Hospital Stay: Payer: Medicaid Other | Attending: Hematology and Oncology | Admitting: Hematology and Oncology

## 2020-02-21 ENCOUNTER — Ambulatory Visit: Payer: Medicaid Other | Admitting: Family Medicine

## 2020-04-01 ENCOUNTER — Encounter: Payer: Self-pay | Admitting: Family Medicine

## 2020-04-02 ENCOUNTER — Encounter: Payer: Self-pay | Admitting: Family Medicine

## 2020-04-02 ENCOUNTER — Other Ambulatory Visit: Payer: Self-pay

## 2020-04-02 ENCOUNTER — Ambulatory Visit: Payer: 59 | Admitting: Family Medicine

## 2020-04-02 VITALS — BP 138/80 | HR 88 | Temp 98.2°F | Ht 64.0 in | Wt 156.0 lb

## 2020-04-02 DIAGNOSIS — J020 Streptococcal pharyngitis: Secondary | ICD-10-CM

## 2020-04-02 LAB — POCT RAPID STREP A (OFFICE): Rapid Strep A Screen: POSITIVE — AB

## 2020-04-02 MED ORDER — PENICILLIN V POTASSIUM 500 MG PO TABS: 500.0000 mg | ORAL_TABLET | Freq: Three times a day (TID) | ORAL | 0 refills | Status: DC

## 2020-04-02 NOTE — Addendum Note (Signed)
Addended by: Juline Patch on: 04/02/2020 11:48 AM   Modules accepted: Orders

## 2020-04-02 NOTE — Progress Notes (Signed)
Date:  04/02/2020   Name:  Lauren Boyd   DOB:  05-26-1980   MRN:  [id number]   Chief Complaint: Sore Throat (Started Sunday- x 5 days. Hurts to swallow, ears are hurting. Tested for COVID yesterday- no results yet, but doesn't feel like COVID she had before.)  HPI  Lab Results  Component Value Date   CREATININE 0.83 12/02/2018   BUN 25 (H) 12/02/2018   NA 140 12/02/2018   K 4.0 12/02/2018   CL 109 12/02/2018   CO2 24 12/02/2018   Lab Results  Component Value Date   CHOL 142 11/30/2018   HDL 72 11/30/2018   LDLCALC 61 11/30/2018   TRIG 38 11/30/2018   CHOLHDL 2.0 11/30/2018   Lab Results  Component Value Date   TSH 1.144 01/07/2018   No results found for: HGBA1C Lab Results  Component Value Date   WBC 6.8 11/21/2019   HGB 12.4 11/21/2019   HCT 37.4 11/21/2019   MCV 88.8 11/21/2019   PLT 271 11/21/2019   Lab Results  Component Value Date   ALT 16 12/02/2018   AST 17 12/02/2018   ALKPHOS 52 12/02/2018   BILITOT 0.5 12/02/2018     Review of Systems  Patient Active Problem List   Diagnosis Date Noted  . Low ferritin 08/02/2019  . B12 deficiency 08/02/2019  . Difficulty walking 07/18/2019  . Numbness and tingling of left side of face 07/18/2019  . Calculus of gallbladder without cholecystitis without obstruction 08/02/2016    No Known Allergies  Past Surgical History:  Procedure Laterality Date  . CHOLECYSTECTOMY  2018  . HERNIA REPAIR    . TUBAL LIGATION      Social History   Tobacco Use  . Smoking status: Former Smoker    Types: Cigarettes  . Smokeless tobacco: Never Used  Vaping Use  . Vaping Use: Never used  Substance Use Topics  . Alcohol use: Yes    Comment: occasional  . Drug use: Not Currently     Medication list has been reviewed and updated.  Current Meds  Medication Sig  . cyanocobalamin 1000 MCG tablet Take by mouth.  . ferrous sulfate 324 MG TBEC Take 324 mg by mouth.  Marland Kitchen ibuprofen (ADVIL) 200 MG tablet Take 200 mg  by mouth every 6 (six) hours as needed.  . Multiple Vitamin (MULTIVITAMIN) tablet Take 1 tablet by mouth daily.  . penicillin v potassium (VEETID) 500 MG tablet Take 1 tablet (500 mg total) by mouth 3 (three) times daily.  Marland Kitchen VITAMIN D PO Take by mouth.    PHQ 2/9 Scores 04/02/2020 11/12/2019 07/12/2019 08/29/2018  PHQ - 2 Score 0 0 0 0  PHQ- 9 Score 0 0 0 0    GAD 7 : Generalized Anxiety Score 04/02/2020 11/12/2019 07/12/2019  Nervous, Anxious, on Edge 0 0 0  Control/stop worrying 0 0 0  Worry too much - different things 0 0 0  Trouble relaxing 0 0 0  Restless 0 0 0  Easily annoyed or irritable 0 0 0  Afraid - awful might happen 0 0 0  Total GAD 7 Score 0 0 0    BP Readings from Last 3 Encounters:  04/02/20 138/80  11/12/19 138/80  08/21/19 115/65    Physical Exam  Wt Readings from Last 3 Encounters:  04/02/20 156 lb (70.8 kg)  11/12/19 161 lb (73 kg)  08/21/19 159 lb 6.3 oz (72.3 kg)    BP 138/80   Pulse 88  Temp 98.2 F (36.8 C) (Oral)   Ht 5\' 4"  (1.626 m)   Wt 156 lb (70.8 kg)   SpO2 98%   BMI 26.78 kg/m   Assessment and Plan:

## 2020-04-02 NOTE — Patient Instructions (Addendum)
Strep Throat, Adult Strep throat is an infection in the throat that is caused by bacteria. It is common during the cold months of the year. It mostly affects children who are 60-40 years old. However, people of all ages can get it at any time of the year. This infection spreads from person to person (is contagious) through coughing, sneezing, or having close contact. Your health care provider may use other names to describe the infection. It can be called tonsillitis (if there is swelling of the tonsils), or pharyngitis (if there is swelling at the back of the throat). What are the causes? This condition is caused by the Streptococcus pyogenes bacteria. What increases the risk? You are more likely to develop this condition if:  You care for school-age children, or are around school-age children. Children are more likely to get strep throat and may spread it to others.  You spend time in crowded places where the infection can spread easily.  You have close contact with someone who has strep throat. What are the signs or symptoms? Symptoms of this condition include:  Fever or chills.  Redness, swelling, or pain in the tonsils or throat.  Pain or difficulty when swallowing.  White or yellow spots on the tonsils or throat.  Tender glands in the neck and under the jaw.  Bad smelling breath.  Red rash all over the body. This is rare. How is this diagnosed? This condition is diagnosed by tests that check for the presence and the amount of bacteria that cause strep throat. They are:  Rapid strep test. Your throat is swabbed and checked for the presence of bacteria. Results are usually ready in minutes.  Throat culture test. Your throat is swabbed. The sample is placed in a cup that allows infections to grow. Results are usually ready in 1 or 2 days.   How is this treated? This condition may be treated with:  Medicines that kill germs (antibiotics).  Medicines that relieve pain or  fever. These include: ? Ibuprofen or acetaminophen. ? Aspirin, only for patients who are over the age of 37. ? Throat lozenges. ? Throat sprays. Follow these instructions at home: Medicines  Take over-the-counter and prescription medicines only as told by your health care provider.  Take your antibiotic medicine as told by your health care provider. Do not stop taking the antibiotic even if you start to feel better.   Eating and drinking  If you have trouble swallowing, try eating soft foods until your sore throat feels better.  Drink enough fluid to keep your urine pale yellow.  To help relieve pain, you may have: ? Warm fluids, such as soup and tea. ? Cold fluids, such as frozen desserts or popsicles.   General instructions  Gargle with a salt-water mixture 3-4 times a day or as needed. To make a salt-water mixture, completely dissolve -1 tsp (3-6 g) of salt in 1 cup (237 mL) of warm water.  Get plenty of rest.  Stay home from work or school until you have been taking antibiotics for 24 hours.  Avoid smoking or being around people who smoke.  Keep all follow-up visits as told by your health care provider. This is important. How is this prevented?  Do not share food, drinking cups, or personal items that could cause the infection to spread to other people.  Wash your hands well with soap and water, and make sure that all people in your house wash their hands well.  Have family  members tested if they have a sore throat or fever. They may need an antibiotic if they have strep throat.   Contact a health care provider if:  The glands in your neck continue to get bigger.  You develop a rash, cough, or earache.  You cough up a thick mucus that is green, yellow-brown, or bloody.  You have pain or discomfort that does not get better with medicine.  Your symptoms seem to be getting worse and not better.  You have a fever. Get help right away if:  You have new symptoms,  such as vomiting, severe headache, stiff or painful neck, chest pain, or shortness of breath.  You have severe throat pain, drooling, or changes in your voice.  You have swelling of the neck, or the skin on the neck becomes red and tender.  You have signs of dehydration, such as tiredness (fatigue), dry mouth, and decreased urination.  You become increasingly sleepy, or you cannot wake up completely.  Your joints become red or painful. Summary  Strep throat is an infection in the throat that is caused by the Streptococcus pyogenes bacteria. This infection is spread from person to person (is contagious) through coughing, sneezing, or having close contact.  Take your medicines, including antibiotics, as told by your health care provider. Do not stop taking the antibiotic even if you start to feel better.  To prevent the spread of germs, wash your hands well with soap and water. Have others do the same. Do not share food, drinking cups, or personal items.  Get help right away if you have new symptoms, such as vomiting, severe headache, stiff or painful neck, chest pain, or shortness of breath. This information is not intended to replace advice given to you by your health care provider. Make sure you discuss any questions you have with your health care provider. Document Revised: 05/18/2018 Document Reviewed: 05/18/2018 Elsevier Patient Education  2021 [employer] Pharyngitis  Pharyngitis is redness, pain, and swelling (inflammation) of the throat (pharynx). It is a very common cause of sore throat. Pharyngitis can be caused by a bacteria, but it is usually caused by a virus. Most cases of pharyngitis get better on their own without treatment. What are the causes? This condition may be caused by:  Infection by viruses (viral). Viral pharyngitis spreads from person to person (is contagious) through coughing, sneezing, and sharing of personal items or utensils such as cups, forks, spoons,  and toothbrushes.  Infection by bacteria (bacterial). Bacterial pharyngitis may be spread by touching the nose or face after coming in contact with the bacteria, or through more intimate contact, such as kissing.  Allergies. Allergies can cause buildup of mucus in the throat (post-nasal drip), leading to inflammation and irritation. Allergies can also cause blocked nasal passages, forcing breathing through the mouth, which dries and irritates the throat. What increases the risk? You are more likely to develop this condition if:  You are 98-34 years old.  You are exposed to crowded environments such as daycare, school, or dormitory living.  You live in a cold climate.  You have a weakened disease-fighting (immune) system. What are the signs or symptoms? Symptoms of this condition vary by the cause (viral, bacterial, or allergies) and can include:  Sore throat.  Fatigue.  Low-grade fever.  Headache.  Joint pain and muscle aches.  Skin rashes.  Swollen glands in the throat (lymph nodes).  Plaque-like film on the throat or tonsils. This is often a symptom of  bacterial pharyngitis.  Vomiting.  Stuffy nose (nasal congestion).  Cough.  Red, itchy eyes (conjunctivitis).  Loss of appetite. How is this diagnosed? This condition is often diagnosed based on your medical history and a physical exam. Your health care provider will ask you questions about your illness and your symptoms. A swab of your throat may be done to check for bacteria (rapid strep test). Other lab tests may also be done, depending on the suspected cause, but these are rare. How is this treated? This condition usually gets better in 3-4 days without medicine. Bacterial pharyngitis may be treated with antibiotic medicines. Follow these instructions at home:  Take over-the-counter and prescription medicines only as told by your health care provider. ? If you were prescribed an antibiotic medicine, take it as  told by your health care provider. Do not stop taking the antibiotic even if you start to feel better. ? Do not give children aspirin because of the association with Reye syndrome.  Drink enough water and fluids to keep your urine clear or pale yellow.  Get a lot of rest.  Gargle with a salt-water mixture 3-4 times a day or as needed. To make a salt-water mixture, completely dissolve -1 tsp of salt in 1 cup of warm water.  If your health care provider approves, you may use throat lozenges or sprays to soothe your throat. Contact a health care provider if:  You have large, tender lumps in your neck.  You have a rash.  You cough up green, yellow-brown, or bloody spit. Get help right away if:  Your neck becomes stiff.  You drool or are unable to swallow liquids.  You cannot drink or take medicines without vomiting.  You have severe pain that does not go away, even after you take medicine.  You have trouble breathing, and it is not caused by a stuffy nose.  You have new pain and swelling in your joints such as the knees, ankles, wrists, or elbows. Summary  Pharyngitis is redness, pain, and swelling (inflammation) of the throat (pharynx).  While pharyngitis can be caused by a bacteria, the most common causes are viral.  Most cases of pharyngitis get better on their own without treatment.  Bacterial pharyngitis is treated with antibiotic medicines. This information is not intended to replace advice given to you by your health care provider. Make sure you discuss any questions you have with your health care provider. Document Revised: 02/10/2017 Document Reviewed: 04/05/2016 Elsevier Patient Education  2021 [place]: What to Do if You Are Sick If you have a fever, cough or other symptoms, you might have COVID-19. Most people have mild illness and are able to recover at home. If you are sick:  Keep track of your symptoms.  If you have an emergency warning sign  (including trouble breathing), call 911. Steps to help prevent the spread of COVID-19 if you are sick If you are sick with COVID-19 or think you might have COVID-19, follow the steps below to care for yourself and to help protect other people in your home and community. Stay home except to get medical care  Stay home. Most people with COVID-19 have mild illness and can recover at home without medical care. Do not leave your home, except to get medical care. Do not visit public areas.  Take care of yourself. Get rest and stay hydrated. Take over-the-counter medicines, such as acetaminophen, to help you feel better.  Stay in touch with your doctor. Call before  you get medical care. Be sure to get care if you have trouble breathing, or have any other emergency warning signs, or if you think it is an emergency.  Avoid public transportation, ride-sharing, or taxis. Separate yourself from other people As much as possible, stay in a specific room and away from other people and pets in your home. If possible, you should use a separate bathroom. If you need to be around other people or animals in or outside of the home, wear a mask. Tell your close contactsthat they may have been exposed to COVID-19. An infected person can spread COVID-19 starting 48 hours (or 2 days) before the person has any symptoms or tests positive. By letting your close contacts know they may have been exposed to COVID-19, you are helping to protect everyone.  Additional guidance is available for those living in close quarters and shared housing.  See COVID-19 and Animals if you have questions about pets.  If you are diagnosed with COVID-19, someone from the health department may call you. Answer the call to slow the spread. Monitor your symptoms  Symptoms of COVID-19 include fever, cough, or other symptoms.  Follow care instructions from your healthcare provider and local health department. Your local health authorities may  give instructions on checking your symptoms and reporting information. When to seek emergency medical attention Look for emergency warning signs* for COVID-19. If someone is showing any of these signs, seek emergency medical care immediately:  Trouble breathing  Persistent pain or pressure in the chest  New confusion  Inability to wake or stay awake  Pale, gray, or blue-colored skin, lips, or nail beds, depending on skin tone *This list is not all possible symptoms. Please call your medical provider for any other symptoms that are severe or concerning to you. Call 911 or call ahead to your local emergency facility: Notify the operator that you are seeking care for someone who has or may have COVID-19. Call ahead before visiting your doctor  Call ahead. Many medical visits for routine care are being postponed or done by phone or telemedicine.  If you have a medical appointment that cannot be postponed, call your doctor's office, and tell them you have or may have COVID-19. This will help the office protect themselves and other patients. Get  tested  If you have symptoms of COVID-19, get tested. While waiting for test results, you stay away from others, including staying apart from those living in your household.  You can visit your state, tribal, local, and territorialhealth department's website to look for the latest local information on testing sites. If you are sick, wear a mask over your nose and mouth  You should wear a mask over your nose and mouth if you must be around other people or animals, including pets (even at home).  You don't need to wear the mask if you are alone. If you can't put on a mask (because of trouble breathing, for example), cover your coughs and sneezes in some other way. Try to stay at least 6 feet away from other people. This will help protect the people around you.  Masks should not be placed on young children under age 64 years, anyone who has trouble  breathing, or anyone who is not able to remove the mask without help. Note: During the COVID-19 pandemic, medical grade facemasks are reserved for healthcare workers and some first responders. Cover your coughs and sneezes  Cover your mouth and nose with a tissue when you  cough or sneeze.  Throw away used tissues in a lined trash can.  Immediately wash your hands with soap and water for at least 20 seconds. If soap and water are not available, clean your hands with an alcohol-based hand sanitizer that contains at least 60% alcohol. Clean your hands often  Wash your hands often with soap and water for at least 20 seconds. This is especially important after blowing your nose, coughing, or sneezing; going to the bathroom; and before eating or preparing food.  Use hand sanitizer if soap and water are not available. Use an alcohol-based hand sanitizer with at least 60% alcohol, covering all surfaces of your hands and rubbing them together until they feel dry.  Soap and water are the best option, especially if hands are visibly dirty.  Avoid touching your eyes, nose, and mouth with unwashed hands.  Handwashing Tips Avoid sharing personal household items  Do not share dishes, drinking glasses, cups, eating utensils, towels, or bedding with other people in your home.  Wash these items thoroughly after using them with soap and water or put in the dishwasher. Clean all "high-touch" surfaces everyday  Clean and disinfect high-touch surfaces in your "sick room" and bathroom; wear disposable gloves. Let someone else clean and disinfect surfaces in common areas, but you should clean your bedroom and bathroom, if possible.  If a caregiver or other person needs to clean and disinfect a sick person's bedroom or bathroom, they should do so on an as-needed basis. The caregiver/other person should wear a mask and disposable gloves prior to cleaning. They should wait as long as possible after the person who  is sick has used the bathroom before coming in to clean and use the bathroom. ? High-touch surfaces include phones, remote controls, counters, tabletops, doorknobs, bathroom fixtures, toilets, keyboards, tablets, and bedside tables.  Clean and disinfect areas that may have blood, stool, or body fluids on them.  Use household cleaners and disinfectants. Clean the area or item with soap and water or another detergent if it is dirty. Then, use a household disinfectant. ? Be sure to follow the instructions on the label to ensure safe and effective use of the product. Many products recommend keeping the surface wet for several minutes to ensure germs are killed. Many also recommend precautions such as wearing gloves and making sure you have good ventilation during use of the product. ? Use a product from [employer]: Disinfectants for Coronavirus (YYTKP-54). ? Complete Disinfection Guidance When you can be around others after being sick with COVID-19 Deciding when you can be around others is different for different situations. Find out when you can safely end home isolation. For any additional questions about your care, contact your healthcare provider or state or local health department. 05/29/2019 Content source: [facility] ([facility]), Division of Viral Diseases This information is not intended to replace advice given to you by your health care provider. Make sure you discuss any questions you have with your health care provider. Document Revised: 01/13/2020 Document Reviewed: 01/13/2020 Elsevier Patient Education  2021 [employer].

## 2020-04-02 NOTE — Progress Notes (Addendum)
Date:  04/02/2020   Name:  Lauren Boyd   DOB:  [birth year]   MRN:  [id number]   Chief Complaint: Sore Throat (Started Sunday- x 5 days. Hurts to swallow, ears are hurting. Tested for COVID yesterday- no results yet, but doesn't feel like COVID she had before.)  Sore Throat  This is a new problem. The current episode started in the past 7 days. The problem has been gradually worsening. The pain is worse on the right side. There has been no fever. The pain is at a severity of 7/10. The pain is moderate. Associated symptoms include a hoarse voice, a plugged ear sensation and swollen glands. Pertinent negatives include no abdominal pain, congestion, coughing, diarrhea, drooling, ear discharge, ear pain, headaches, neck pain, shortness of breath, stridor, trouble swallowing or vomiting. She has had no exposure to strep or mono. She has tried gargles, acetaminophen and cool liquids for the symptoms. The treatment provided mild relief.    Lab Results  Component Value Date   CREATININE 0.83 12/02/2018   BUN 25 (H) 12/02/2018   NA 140 12/02/2018   K 4.0 12/02/2018   CL 109 12/02/2018   CO2 24 12/02/2018   Lab Results  Component Value Date   CHOL 142 11/30/2018   HDL 72 11/30/2018   LDLCALC 61 11/30/2018   TRIG 38 11/30/2018   CHOLHDL 2.0 11/30/2018   Lab Results  Component Value Date   TSH 1.144 01/07/2018   No results found for: HGBA1C Lab Results  Component Value Date   WBC 6.8 11/21/2019   HGB 12.4 11/21/2019   HCT 37.4 11/21/2019   MCV 88.8 11/21/2019   PLT 271 11/21/2019   Lab Results  Component Value Date   ALT 16 12/02/2018   AST 17 12/02/2018   ALKPHOS 52 12/02/2018   BILITOT 0.5 12/02/2018     Review of Systems  Constitutional: Negative.  Negative for chills, fatigue, fever and unexpected weight change.  HENT: Positive for hoarse voice. Negative for congestion, drooling, ear discharge, ear pain, rhinorrhea, sinus pressure, sneezing, sore throat and trouble  swallowing.   Eyes: Negative for double vision, photophobia, pain, discharge, redness and itching.  Respiratory: Negative for cough, shortness of breath, wheezing and stridor.   Gastrointestinal: Negative for abdominal pain, blood in stool, constipation, diarrhea, nausea and vomiting.  Endocrine: Negative for cold intolerance, heat intolerance, polydipsia, polyphagia and polyuria.  Genitourinary: Negative for dysuria, flank pain, frequency, hematuria, menstrual problem, pelvic pain, urgency, vaginal bleeding and vaginal discharge.  Musculoskeletal: Negative for arthralgias, back pain, myalgias and neck pain.  Skin: Negative for rash.  Allergic/Immunologic: Negative for environmental allergies and food allergies.  Neurological: Negative for dizziness, weakness, light-headedness, numbness and headaches.  Hematological: Negative for adenopathy. Does not bruise/bleed easily.  Psychiatric/Behavioral: Negative for dysphoric mood. The patient is not nervous/anxious.     Patient Active Problem List   Diagnosis Date Noted  . Low ferritin 08/02/2019  . B12 deficiency 08/02/2019  . Difficulty walking 07/18/2019  . Numbness and tingling of left side of face 07/18/2019  . Calculus of gallbladder without cholecystitis without obstruction 08/02/2016    No Known Allergies  Past Surgical History:  Procedure Laterality Date  . CHOLECYSTECTOMY  2018  . HERNIA REPAIR    . TUBAL LIGATION      Social History   Tobacco Use  . Smoking status: Former Smoker    Types: Cigarettes  . Smokeless tobacco: Never Used  Vaping Use  . Vaping Use: Never  used  Substance Use Topics  . Alcohol use: Yes    Comment: occasional  . Drug use: Not Currently     Medication list has been reviewed and updated.  Current Meds  Medication Sig  . cyanocobalamin 1000 MCG tablet Take by mouth.  . ferrous sulfate 324 MG TBEC Take 324 mg by mouth.  Marland Kitchen ibuprofen (ADVIL) 200 MG tablet Take 200 mg by mouth every 6 (six)  hours as needed.  . Multiple Vitamin (MULTIVITAMIN) tablet Take 1 tablet by mouth daily.  Marland Kitchen VITAMIN D PO Take by mouth.    PHQ 2/9 Scores 04/02/2020 11/12/2019 07/12/2019 08/29/2018  PHQ - 2 Score 0 0 0 0  PHQ- 9 Score 0 0 0 0    GAD 7 : Generalized Anxiety Score 04/02/2020 11/12/2019 07/12/2019  Nervous, Anxious, on Edge 0 0 0  Control/stop worrying 0 0 0  Worry too much - different things 0 0 0  Trouble relaxing 0 0 0  Restless 0 0 0  Easily annoyed or irritable 0 0 0  Afraid - awful might happen 0 0 0  Total GAD 7 Score 0 0 0    BP Readings from Last 3 Encounters:  04/02/20 138/80  11/12/19 138/80  08/21/19 115/65    Physical Exam Vitals and nursing note reviewed.  Constitutional:      Appearance: She is well-developed and well-nourished.  HENT:     Head: Normocephalic.     Right Ear: Tympanic membrane, ear canal and external ear normal.     Left Ear: Tympanic membrane, ear canal and external ear normal.     Mouth/Throat:     Mouth: Mucous membranes are moist.     Tongue: No lesions.     Pharynx: Pharyngeal swelling and posterior oropharyngeal erythema present. No oropharyngeal exudate or uvula swelling.     Tonsils: No tonsillar exudate or tonsillar abscesses.  Eyes:     General: Lids are everted, no foreign bodies appreciated. No scleral icterus.       Left eye: No foreign body or hordeolum.     Extraocular Movements: EOM normal.     Conjunctiva/sclera: Conjunctivae normal.     Right eye: Right conjunctiva is not injected.     Left eye: Left conjunctiva is not injected.     Pupils: Pupils are equal, round, and reactive to light.  Neck:     Thyroid: No thyromegaly.     Vascular: No JVD.     Trachea: No tracheal deviation.  Cardiovascular:     Rate and Rhythm: Normal rate and regular rhythm.     Pulses: Intact distal pulses.     Heart sounds: Normal heart sounds. No murmur heard. No friction rub. No gallop.   Pulmonary:     Effort: Pulmonary effort is normal. No  respiratory distress.     Breath sounds: Normal breath sounds. No wheezing or rales.  Abdominal:     General: Bowel sounds are normal.     Palpations: Abdomen is soft. There is no hepatosplenomegaly or mass.     Tenderness: There is no abdominal tenderness. There is no guarding or rebound.  Musculoskeletal:        General: No tenderness or edema. Normal range of motion.     Cervical back: Normal range of motion and neck supple.  Lymphadenopathy:     Cervical: No cervical adenopathy.  Skin:    General: Skin is warm.     Findings: No rash.  Neurological:  Mental Status: She is alert and oriented to person, place, and time.     Cranial Nerves: No cranial nerve deficit.     Deep Tendon Reflexes: Strength normal. Reflexes normal.  Psychiatric:        Mood and Affect: Mood and affect normal. Mood is not anxious or depressed.     Wt Readings from Last 3 Encounters:  04/02/20 156 lb (70.8 kg)  11/12/19 161 lb (73 kg)  08/21/19 159 lb 6.3 oz (72.3 kg)    BP 138/80   Pulse 88   Temp 98.2 F (36.8 C) (Oral)   Ht 5\' 4"  (1.626 m)   Wt 156 lb (70.8 kg)   SpO2 98%   BMI 26.78 kg/m   Assessment and Plan: 1. Strep pharyngitis New onset.  Persistent.  Stable.  Patient is positive with rapid strep test.  We will treat with Pen-Vee K 500 mg 1 3 times a day for 10 days. - penicillin v potassium (VEETID) 500 MG tablet; Take 1 tablet (500 mg total) by mouth 3 (three) times daily.  Dispense: 40 tablet; Refill: 0  Patient was noted to have eustachian tube dysfunction was suggested to get Sudafed 30 mg.

## 2020-04-12 ENCOUNTER — Encounter: Payer: Self-pay | Admitting: Family Medicine

## 2020-04-23 DIAGNOSIS — R0609 Other forms of dyspnea: Secondary | ICD-10-CM | POA: Diagnosis not present

## 2020-04-23 DIAGNOSIS — K224 Dyskinesia of esophagus: Secondary | ICD-10-CM | POA: Diagnosis not present

## 2020-04-23 DIAGNOSIS — R1013 Epigastric pain: Secondary | ICD-10-CM | POA: Diagnosis not present

## 2020-04-23 DIAGNOSIS — R002 Palpitations: Secondary | ICD-10-CM | POA: Diagnosis not present

## 2020-06-29 DIAGNOSIS — R1013 Epigastric pain: Secondary | ICD-10-CM | POA: Diagnosis not present

## 2020-06-29 DIAGNOSIS — Z87898 Personal history of other specified conditions: Secondary | ICD-10-CM | POA: Diagnosis not present

## 2020-08-05 DIAGNOSIS — R002 Palpitations: Secondary | ICD-10-CM | POA: Diagnosis not present

## 2020-08-05 DIAGNOSIS — K224 Dyskinesia of esophagus: Secondary | ICD-10-CM | POA: Diagnosis not present

## 2020-08-05 DIAGNOSIS — R0609 Other forms of dyspnea: Secondary | ICD-10-CM | POA: Diagnosis not present

## 2020-08-05 DIAGNOSIS — R1013 Epigastric pain: Secondary | ICD-10-CM | POA: Diagnosis not present

## 2020-08-20 DIAGNOSIS — K2950 Unspecified chronic gastritis without bleeding: Secondary | ICD-10-CM | POA: Diagnosis not present

## 2020-12-09 ENCOUNTER — Encounter: Payer: Self-pay | Admitting: Hematology and Oncology

## 2020-12-09 DIAGNOSIS — R0609 Other forms of dyspnea: Secondary | ICD-10-CM | POA: Diagnosis not present

## 2020-12-09 DIAGNOSIS — R002 Palpitations: Secondary | ICD-10-CM | POA: Diagnosis not present

## 2020-12-09 DIAGNOSIS — K224 Dyskinesia of esophagus: Secondary | ICD-10-CM | POA: Diagnosis not present

## 2020-12-09 DIAGNOSIS — R1013 Epigastric pain: Secondary | ICD-10-CM | POA: Diagnosis not present

## 2020-12-10 ENCOUNTER — Ambulatory Visit: Payer: 59 | Admitting: Family Medicine

## 2020-12-10 ENCOUNTER — Ambulatory Visit (INDEPENDENT_AMBULATORY_CARE_PROVIDER_SITE_OTHER): Payer: Medicaid Other | Admitting: Family Medicine

## 2020-12-10 ENCOUNTER — Encounter: Payer: Self-pay | Admitting: Family Medicine

## 2020-12-10 ENCOUNTER — Other Ambulatory Visit: Payer: Self-pay

## 2020-12-10 VITALS — BP 120/70 | HR 64 | Ht 64.0 in | Wt 161.0 lb

## 2020-12-10 DIAGNOSIS — R1033 Periumbilical pain: Secondary | ICD-10-CM | POA: Diagnosis not present

## 2020-12-10 NOTE — Progress Notes (Signed)
Date:  12/10/2020   Name:  Lauren Boyd   DOB:  [birth year]   MRN:  [id number]   Chief Complaint: abnormal sensation (Sensitivity in area of her "belly button"- feels this sensation and then leads to chest pain)  Patient is a 40 year old female who presents for a umbillical hernia concern exam. The patient reports the following problems: per above. Health maintenance has been reviewed immuninizations.     Lab Results  Component Value Date   CREATININE 0.83 12/02/2018   BUN 25 (H) 12/02/2018   NA 140 12/02/2018   K 4.0 12/02/2018   CL 109 12/02/2018   CO2 24 12/02/2018   Lab Results  Component Value Date   CHOL 142 11/30/2018   HDL 72 11/30/2018   LDLCALC 61 11/30/2018   TRIG 38 11/30/2018   CHOLHDL 2.0 11/30/2018   Lab Results  Component Value Date   TSH 1.144 01/07/2018   No results found for: HGBA1C Lab Results  Component Value Date   WBC 6.8 11/21/2019   HGB 12.4 11/21/2019   HCT 37.4 11/21/2019   MCV 88.8 11/21/2019   PLT 271 11/21/2019   Lab Results  Component Value Date   ALT 16 12/02/2018   AST 17 12/02/2018   ALKPHOS 52 12/02/2018   BILITOT 0.5 12/02/2018     Review of Systems  Constitutional:  Negative for chills and fever.  HENT:  Negative for drooling, ear discharge, ear pain and sore throat.   Respiratory:  Negative for cough, shortness of breath and wheezing.   Cardiovascular:  Negative for chest pain, palpitations and leg swelling.  Gastrointestinal:  Negative for abdominal pain, blood in stool, constipation, diarrhea and nausea.  Endocrine: Negative for polydipsia.  Genitourinary:  Negative for dysuria, frequency, hematuria and urgency.  Musculoskeletal:  Negative for back pain, myalgias and neck pain.  Skin:  Negative for rash.  Allergic/Immunologic: Negative for environmental allergies.  Neurological:  Negative for dizziness and headaches.  Hematological:  Does not bruise/bleed easily.  Psychiatric/Behavioral:  Negative for suicidal  ideas. The patient is not nervous/anxious.    Patient Active Problem List   Diagnosis Date Noted   Low ferritin 08/02/2019   B12 deficiency 08/02/2019   Difficulty walking 07/18/2019   Numbness and tingling of left side of face 07/18/2019   Calculus of gallbladder without cholecystitis without obstruction 08/02/2016    No Known Allergies  Past Surgical History:  Procedure Laterality Date   CHOLECYSTECTOMY  2018   HERNIA REPAIR     TUBAL LIGATION      Social History   Tobacco Use   Smoking status: Former    Types: Cigarettes   Smokeless tobacco: Never  Vaping Use   Vaping Use: Never used  Substance Use Topics   Alcohol use: Yes    Comment: occasional   Drug use: Not Currently     Medication list has been reviewed and updated.  No outpatient medications have been marked as taking for the 12/10/20 encounter (Office Visit) with [name], MD.    [facility] 2/9 Scores 04/02/2020 11/12/2019 07/12/2019 08/29/2018  PHQ - 2 Score 0 0 0 0  PHQ- 9 Score 0 0 0 0    GAD 7 : Generalized Anxiety Score 04/02/2020 11/12/2019 07/12/2019  Nervous, Anxious, on Edge 0 0 0  Control/stop worrying 0 0 0  Worry too much - different things 0 0 0  Trouble relaxing 0 0 0  Restless 0 0 0  Easily annoyed or irritable  0 0 0  Afraid - awful might happen 0 0 0  Total GAD 7 Score 0 0 0    BP Readings from Last 3 Encounters:  12/10/20 120/70  04/02/20 138/80  11/12/19 138/80    Physical Exam Vitals and nursing note reviewed.  Constitutional:      Appearance: She is well-developed.  HENT:     Head: Normocephalic.     Right Ear: Tympanic membrane and external ear normal.     Left Ear: Tympanic membrane and external ear normal.     Nose: Nose normal.  Eyes:     General: Lids are everted, no foreign bodies appreciated. No scleral icterus.       Left eye: No foreign body or hordeolum.     Conjunctiva/sclera: Conjunctivae normal.     Right eye: Right conjunctiva is not injected.     Left  eye: Left conjunctiva is not injected.     Pupils: Pupils are equal, round, and reactive to light.  Neck:     Thyroid: No thyromegaly.     Vascular: No JVD.     Trachea: No tracheal deviation.  Cardiovascular:     Rate and Rhythm: Normal rate and regular rhythm.     Heart sounds: Normal heart sounds. No murmur heard.   No friction rub. No gallop.  Pulmonary:     Effort: Pulmonary effort is normal. No respiratory distress.     Breath sounds: Normal breath sounds. No wheezing or rales.  Abdominal:     General: Bowel sounds are normal.     Palpations: Abdomen is soft. There is no mass.     Tenderness: There is no abdominal tenderness. There is no guarding or rebound.  Musculoskeletal:        General: No tenderness. Normal range of motion.     Cervical back: Normal range of motion and neck supple.  Lymphadenopathy:     Cervical: No cervical adenopathy.  Skin:    General: Skin is warm.     Findings: No rash.  Neurological:     Mental Status: She is alert and oriented to person, place, and time.     Cranial Nerves: No cranial nerve deficit.     Deep Tendon Reflexes: Reflexes normal.  Psychiatric:        Mood and Affect: Mood is not anxious or depressed.    Wt Readings from Last 3 Encounters:  12/10/20 161 lb (73 kg)  04/02/20 156 lb (70.8 kg)  11/12/19 161 lb (73 kg)    BP 120/70   Pulse 64   Ht 5\' 4"  (1.626 m)   Wt 161 lb (73 kg)   BMI 27.64 kg/m   Assessment and Plan:  1. Umbilical pain New onset.  Persistent.  Relatively stable.  Patient has been having discomfort in her umbilicus in which has 2 palpable nodules which are tender which seem to enlarge with Valsalva.  Patient states that she had a tubal ligation and repair of her hernia in this area and I am wondering if this is keloid formation or a recurrence of her umbilical hernia.  This is now causing some discomfort which is affecting the patient's quality of life.  We will refer to general surgery for evaluation and  perhaps treatment thereof. - Ambulatory referral to General Surgery

## 2020-12-16 ENCOUNTER — Encounter: Payer: Self-pay | Admitting: Hematology and Oncology

## 2020-12-23 ENCOUNTER — Other Ambulatory Visit: Payer: Self-pay

## 2020-12-23 ENCOUNTER — Encounter: Payer: Self-pay | Admitting: Surgery

## 2020-12-23 ENCOUNTER — Ambulatory Visit (INDEPENDENT_AMBULATORY_CARE_PROVIDER_SITE_OTHER): Payer: Medicaid Other | Admitting: Surgery

## 2020-12-23 VITALS — BP 128/86 | HR 78 | Temp 98.4°F | Ht 64.0 in | Wt 161.6 lb

## 2020-12-23 DIAGNOSIS — G8929 Other chronic pain: Secondary | ICD-10-CM

## 2020-12-23 DIAGNOSIS — R1013 Epigastric pain: Secondary | ICD-10-CM | POA: Diagnosis not present

## 2020-12-23 DIAGNOSIS — K429 Umbilical hernia without obstruction or gangrene: Secondary | ICD-10-CM

## 2020-12-23 NOTE — Progress Notes (Signed)
12/23/2020  Reason for Visit: Epigastric pain, possible umbilical hernia  Referring Provider:  Otilio Miu, MD  History of Present Illness: Lauren Boyd is a 40 y.o. female presenting for evaluation of epigastric pain and a possible umbilical hernia.  The patient has been seen for this epigastric pain for the past year by multiple providers and she has undergone GI work-up as well as been seen by cardiology.  She has had an upper endoscopy which was reportedly normal although I am unable to see any reports or images.  She has also undergone an echocardiogram and a stress test with cardiology which were unremarkable.  No cardiac catheterization was needed.  She has had an abdominal ultrasound, as well as CT of her chest.  Patient reports that she will have intermittent waves of very sharp epigastric pain inferior to the xiphoid process.  She can feel the pain as it is about to start and get more severe.  This does not last too long but is very limiting and the patient feels that at times she is down on her knees due to pain.  Denies any nausea, vomiting, diarrhea, constipation.  She also has a history of a prior tubal ligation which resulted in an umbilical hernia which was repaired and subsequently a laparoscopic cholecystectomy in 2018.  She does feel a hard knot at the umbilicus which is tender to palpation but there is no specific bulging sensation there.  When she gets the waves of pain in the epigastric area, there is no related pain at the umbilicus.  It is not necessarily related to p.o. intake either.  Her last episode was a few to couple months ago but does not follow a particular pattern of spacing between episodes.  Past Medical History: Past Medical History:  Diagnosis Date   History of 2019 novel coronavirus disease (COVID-19) 06/20/2019     Past Surgical History: Past Surgical History:  Procedure Laterality Date   CHOLECYSTECTOMY  2018   HERNIA REPAIR     TUBAL LIGATION       Home Medications: Prior to Admission medications   Medication Sig Start Date End Date Taking? Authorizing Provider  cyanocobalamin 1000 MCG tablet Take by mouth.   Yes [provider]  ferrous sulfate 324 MG TBEC Take 324 mg by mouth.   Yes [provider]  ibuprofen (ADVIL) 200 MG tablet Take 200 mg by mouth every 6 (six) hours as needed.   Yes [provider]  Multiple Vitamin (MULTIVITAMIN) tablet Take 1 tablet by mouth daily.   Yes [provider]  VITAMIN D PO Take by mouth.   Yes [provider]    Allergies: No Known Allergies  Social History:  reports that she has quit smoking. Her smoking use included cigarettes. She has never used smokeless tobacco. She reports current alcohol use. She reports that she does not currently use drugs.   Family History: Family History  Problem Relation Age of Onset   Healthy Mother    Healthy Father    Breast cancer Maternal Grandmother 57    Review of Systems: Review of Systems  Constitutional:  Negative for chills and fever.  Respiratory:  Negative for shortness of breath.   Cardiovascular:  Negative for chest pain.  Gastrointestinal:  Positive for abdominal pain. Negative for constipation, diarrhea, nausea and vomiting.  Genitourinary:  Negative for dysuria.  Musculoskeletal:  Negative for myalgias.  Skin:  Negative for rash.  Neurological:  Negative for dizziness.  Psychiatric/Behavioral:  Negative  for depression.    Physical Exam BP 128/86   Pulse 78   Temp 98.4 F (36.9 C) (Oral)   Ht 5\' 4"  (1.626 m)   Wt 161 lb 9.6 oz (73.3 kg)   LMP 12/12/2020   SpO2 98%   BMI 27.74 kg/m  CONSTITUTIONAL: No acute distress, well nourished HEENT:  Normocephalic, atraumatic, extraocular motion intact. NECK: Trachea is midline, and there is no jugular venous distension.  RESPIRATORY:  Lungs are clear, and breath sounds are equal bilaterally. Normal respiratory effort without pathologic use  of accessory muscles. CARDIOVASCULAR: Heart is regular without murmurs, gallops, or rubs. GI: The abdomen is soft, nondistended, only with tenderness to palpation at the umbilicus where there is a firm nodule that could be scar tissue from her surgeries.  I do not palpate a true hernia defect or feel anything protruding when she coughs.  She otherwise has well-healed laparoscopic incisions there is no hernia palpable in the epigastric area either.  MUSCULOSKELETAL:  Normal muscle strength and tone in all four extremities.  No peripheral edema or cyanosis. SKIN: Skin turgor is normal. There are no pathologic skin lesions.  NEUROLOGIC:  Motor and sensation is grossly normal.  Cranial nerves are grossly intact. PSYCH:  Alert and oriented to person, place and time. Affect is normal.  Laboratory Analysis: No results found for this or any previous visit (from the past 24 hour(s)).  Imaging: CT chest on 01/27/2020: FINDINGS: Cardiovascular: Heart within normal limits. No pericardial fluid/thickening. No significant atherosclerotic calcifications of the coronary arteries. Normal course caliber and contour of the thoracic aorta. Unremarkable pulmonary artery diameter.   Mediastinum/Nodes: No mediastinal adenopathy. Unremarkable thoracic esophagus. Unremarkable thoracic inlet   Lungs/Pleura: Lungs are clear. No pleural effusion or pneumothorax.   Upper Abdomen: No acute finding of the upper abdomen.   Musculoskeletal: Unremarkable   IMPRESSION: Negative for acute abnormality.   Ultrasound abdomen on 01/07/2020: IMPRESSION: Status post cholecystectomy. Otherwise negative abdominal ultrasound.  Assessment and Plan: This is a 40 y.o. female with a chronic history of epigastric abdominal pain.  - Patient has had extensive work-up both with gastroenterology and cardiology.  She has had an EGD, ultrasound, CT chest, echocardiogram, and stress test which have been negative.  On exam, there may  is a small nodule at the umbilicus which could represent scar tissue.  Unclear if this could be a hernia defect but when pushing on this area there is no pain in the epigastric region and in the epigastric region there is no noticeable hernia either.  Discussed with her that at this point I think it would be best to obtain a CT scan of her abdomen and pelvis to better evaluate the anatomy and to see if there is any potential hernia defect at the umbilicus or epigastric region that could explain the cause of her pain.  If this is negative, discussed with her that another possibility could be functional or motor issue within the esophagus or stomach and that potentially manometry with GI may be helpful to determine this versus a barium swallow study. - I will call the patient with the results of her CT scan and further plans or referrals.  Patient understands this plan and all of her questions have been answered.  Face-to-face time spent with the patient and care providers was 60 minutes, with more than 50% of the time spent counseling, educating, and coordinating care of the patient.     Melvyn Neth, [facility]

## 2020-12-23 NOTE — Patient Instructions (Addendum)
Your CT is scheduled for 12/29/2020 at 3:00 pm (arrive at 2:45 pm). Nothing to eat or drink 4 hours prior. Pick up contrast between now and the day before CT.   If you have any concerns or questions, please feel free to call our office.   Umbilical Hernia, Adult A hernia is a bulge of tissue that pushes through an opening between muscles. An umbilical hernia happens in the abdomen, near the belly button (umbilicus). The hernia may contain tissues from the small intestine, large intestine, or fatty tissue covering the intestines (omentum). Umbilical hernias in adults tend to get worse over time, and they require surgical treatment. There are several types of umbilical hernias. You may have: A hernia located just above or below the umbilicus (indirect hernia). This is the most common type of umbilical hernia in adults. A hernia that forms through an opening formed by the umbilicus (direct hernia). A hernia that comes and goes (reducible hernia). A reducible hernia may be visible only when you strain, lift something heavy, or cough. This type of hernia can be pushed back into the abdomen (reduced). A hernia that traps abdominal tissue inside the hernia (incarcerated hernia). This type of hernia cannot be reduced. A hernia that cuts off blood flow to the tissues inside the hernia (strangulated hernia). The tissues can start to die if this happens. This type of hernia requires emergency treatment. What are the causes? An umbilical hernia happens when tissue inside the abdomen presses on a weak area of the abdominal muscles. What increases the risk? You may have a greater risk of this condition if you: Are obese. Have had several pregnancies. Have a buildup of fluid inside your abdomen (ascites). Have had surgery that weakens the abdominal muscles. What are the signs or symptoms? The main symptom of this condition is a painless bulge at or near the belly button. A reducible hernia may be visible only  when you strain, lift something heavy, or cough. Other symptoms may include: Dull pain. A feeling of pressure. Symptoms of a strangulated hernia may include: Pain that gets increasingly worse. Nausea and vomiting. Pain when pressing on the hernia. Skin over the hernia becoming red or purple. Constipation. Blood in the stool. How is this diagnosed? This condition may be diagnosed based on: A physical exam. You may be asked to cough or strain while standing. These actions increase the pressure inside your abdomen and force the hernia through the opening in your muscles. Your health care provider may try to reduce the hernia by pressing on it. Your symptoms and medical history. How is this treated? Surgery is the only treatment for an umbilical hernia. Surgery for a strangulated hernia is done as soon as possible. If you have a small hernia that is not incarcerated, you may need to lose weight before having surgery. Follow these instructions at home: Lose weight, if told by your health care provider. Do not try to push the hernia back in. Watch your hernia for any changes in color or size. Tell your health care provider if any changes occur. You may need to avoid activities that increase pressure on your hernia. Do not lift anything that is heavier than 10 lb (4.5 kg) until your health care provider says that this is safe. Take over-the-counter and prescription medicines only as told by your health care provider. Keep all follow-up visits as told by your health care provider. This is important. Contact a health care provider if: Your hernia gets larger. Your  hernia becomes painful. Get help right away if: You develop sudden, severe pain near the area of your hernia. You have pain as well as nausea or vomiting. You have pain and the skin over your hernia changes color. You develop a fever. This information is not intended to replace advice given to you by your health care provider. Make  sure you discuss any questions you have with your health care provider. Document Revised: 04/12/2017 Document Reviewed: 08/29/2016 Elsevier Patient Education  [place].

## 2020-12-29 ENCOUNTER — Ambulatory Visit
Admission: RE | Admit: 2020-12-29 | Discharge: 2020-12-29 | Disposition: A | Discharge status: Home / Self Care | Payer: Medicaid Other | Source: Ambulatory Visit | Attending: Surgery | Admitting: Surgery

## 2020-12-29 ENCOUNTER — Other Ambulatory Visit: Payer: Self-pay

## 2020-12-29 DIAGNOSIS — R109 Unspecified abdominal pain: Secondary | ICD-10-CM | POA: Diagnosis not present

## 2020-12-29 DIAGNOSIS — K429 Umbilical hernia without obstruction or gangrene: Secondary | ICD-10-CM | POA: Insufficient documentation

## 2020-12-29 IMAGING — CT CT ABD-PELV W/ CM
2 of 4 series · 15 of 46 positions shown, 17 images · IV contrast (APPLIED)
Comparison: None.

CLINICAL DATA: Chronic epigastric abdominal pain.

EXAM:
CT ABDOMEN AND PELVIS WITH CONTRAST
TECHNIQUE: Multidetector CT imaging of the abdomen and pelvis was performed
using the standard protocol following bolus administration of
intravenous contrast.
CONTRAST:  100mL OMNIPAQUE IOHEXOL 300 MG/ML  SOLN

[Series 2: axial st · axial · 0.77mm/px · z∈[+700,+1115]mm · 12 of 95 slices shown, 14 images]
[im 8/95  soft-tissue]
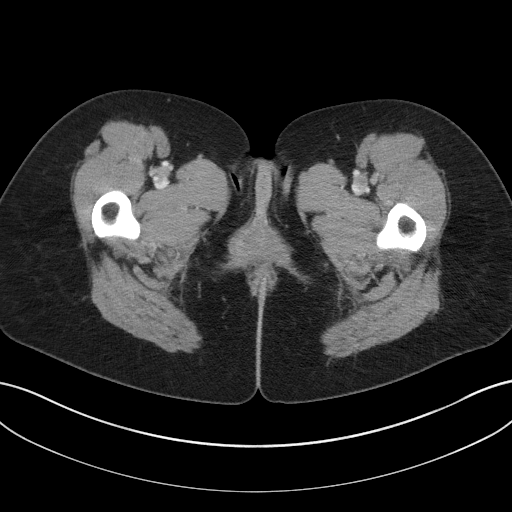
[im 8/95  bone]
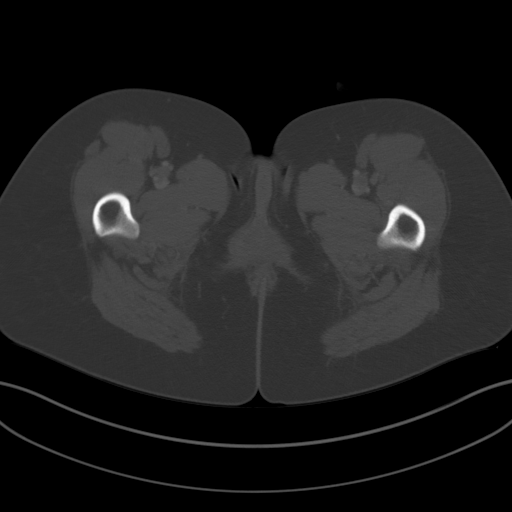
[im 16/95  soft-tissue]
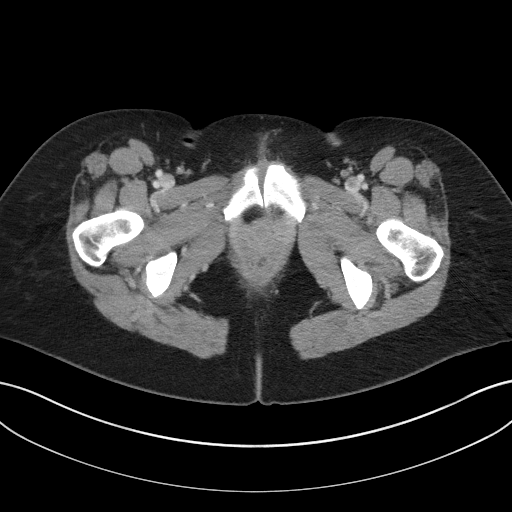
[im 23/95  soft-tissue]
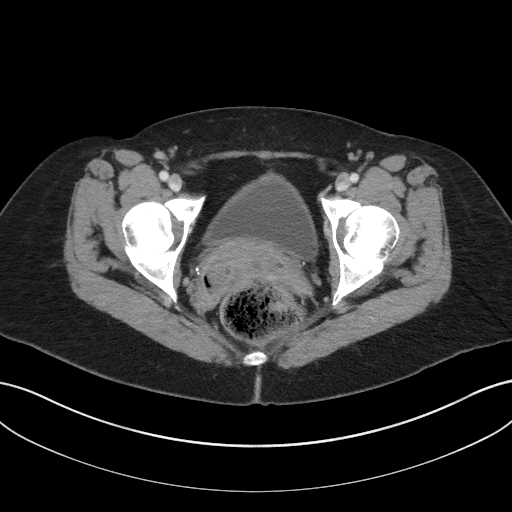
[im 31/95  soft-tissue]
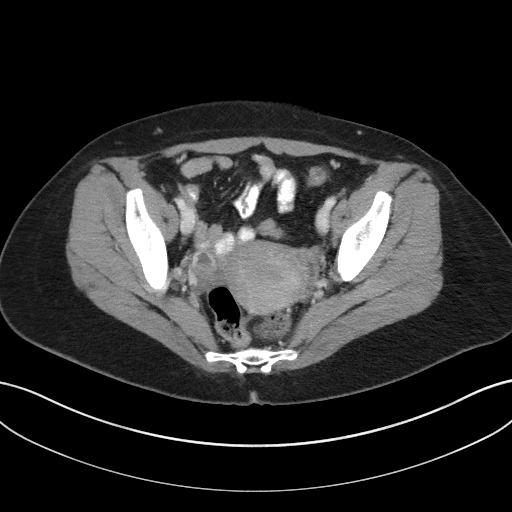
[im 38/95  soft-tissue]
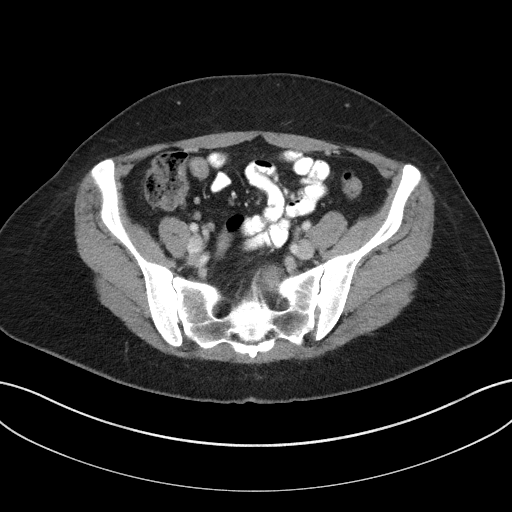
[im 46/95  soft-tissue]
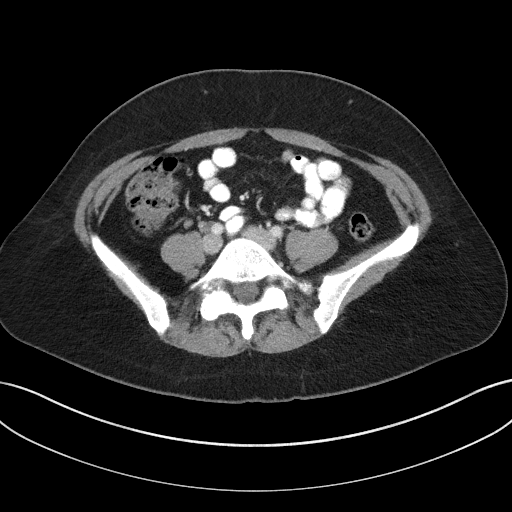
[im 53/95  soft-tissue]
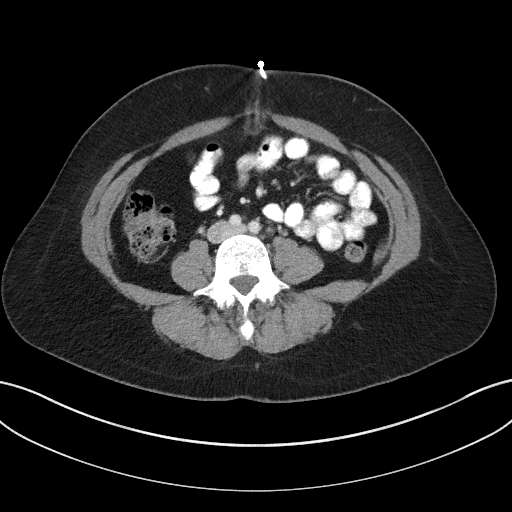
[im 61/95  soft-tissue]
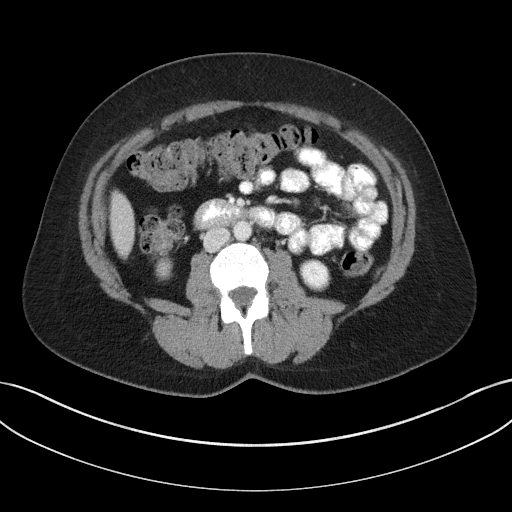
[im 68/95  soft-tissue]
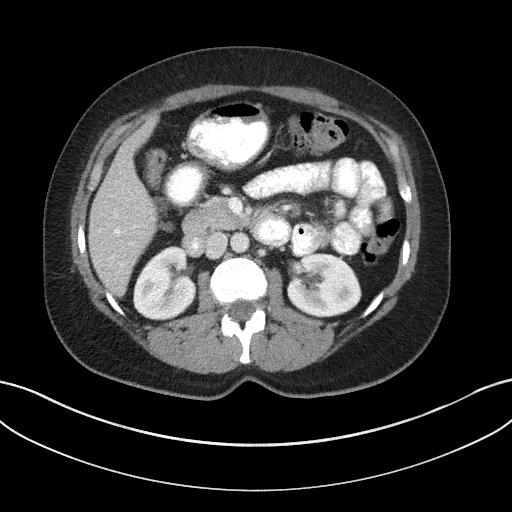
[im 68/95  bone]
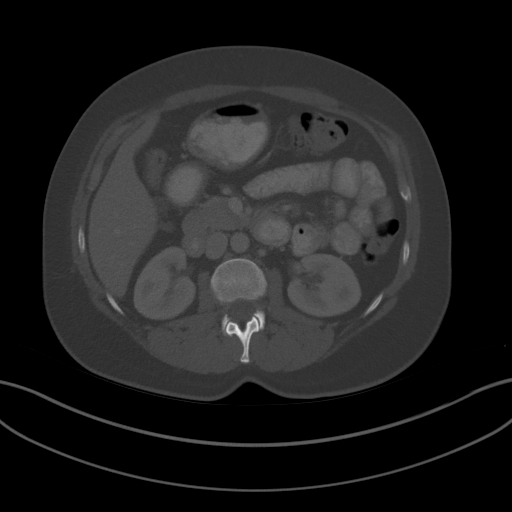
[im 76/95  soft-tissue]
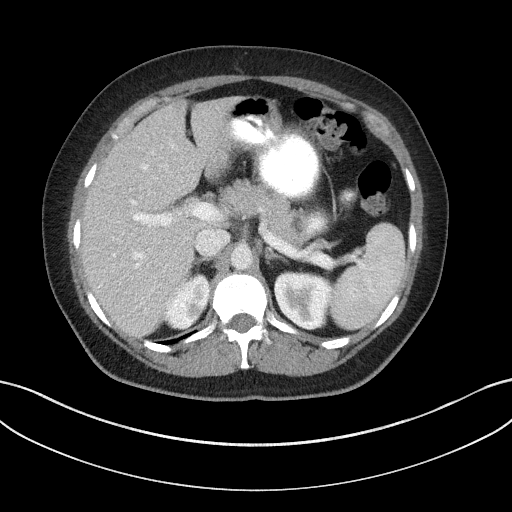
[im 83/95  soft-tissue]
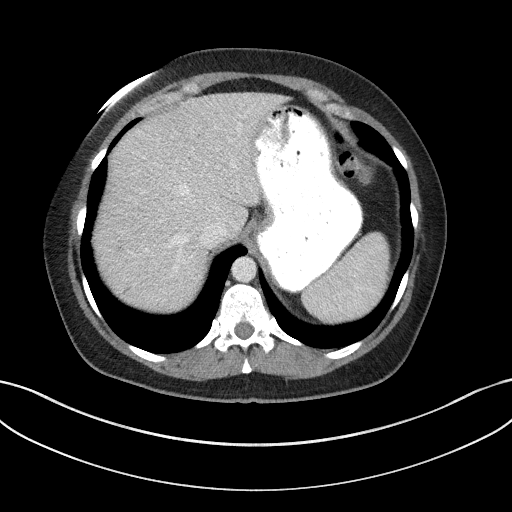
[im 91/95  soft-tissue]
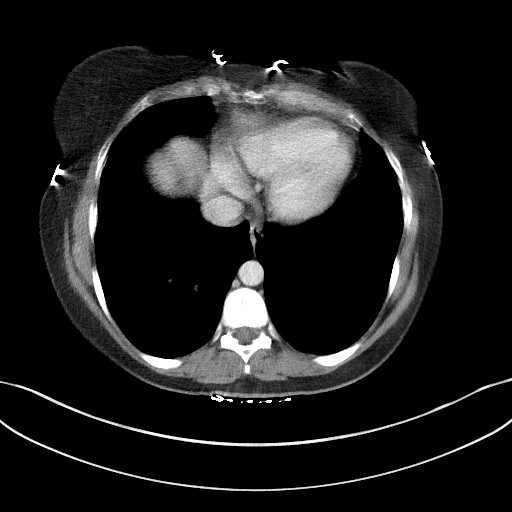

[Series 5: coronal st · coronal · 0.71mm/px · 3 of 110 slices shown]
[im 37/110  soft-tissue]
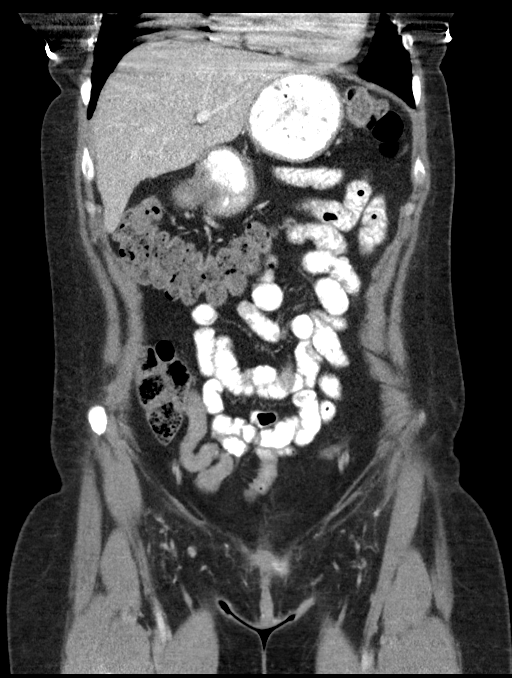
[im 49/110  soft-tissue]
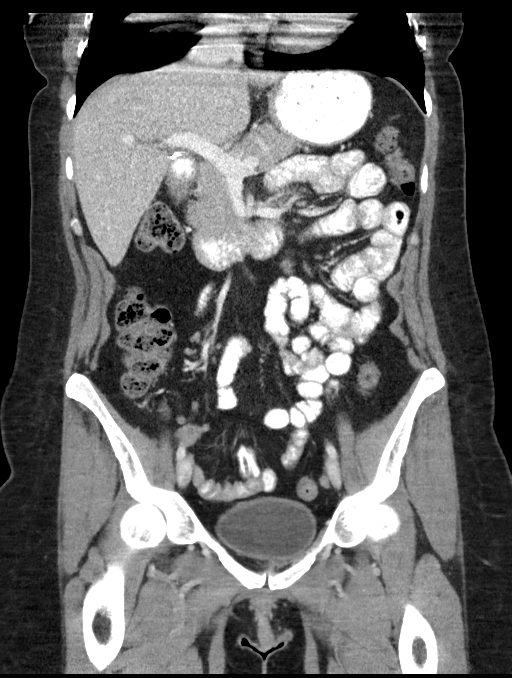
[im 61/110  soft-tissue]
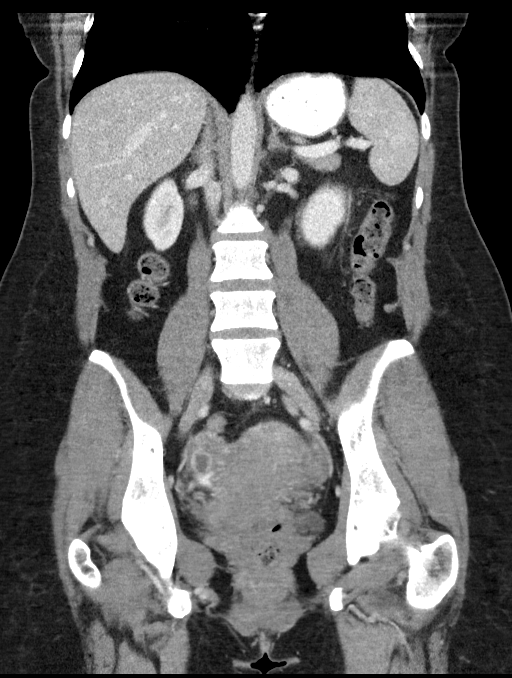

[15 of 46 positions shown; findings below may reference images not displayed]

FINDINGS: Lower chest: No acute abnormality.

Hepatobiliary: 11 mm low density is noted in superior portion of
right hepatic lobe with average Hounsfield measurement of 87. Status
post cholecystectomy. No biliary dilatation.

Pancreas: Unremarkable. No pancreatic ductal dilatation or
surrounding inflammatory changes.

Spleen: Normal in size without focal abnormality.

Adrenals/Urinary Tract: Adrenal glands are unremarkable. Kidneys are
normal, without renal calculi, focal lesion, or hydronephrosis.
Bladder is unremarkable.

Stomach/Bowel: Stomach is within normal limits. Appendix appears
normal. No evidence of bowel wall thickening, distention, or
inflammatory changes.

Vascular/Lymphatic: No significant vascular findings are present. No
enlarged abdominal or pelvic lymph nodes.

Reproductive: Uterus and bilateral adnexa are unremarkable.

Other: No ascites is noted. There is the suggestion of a rounded
soft tissue abnormality measuring 21 x 20 x 11 mm and probable small
periumbilical hernia.

Musculoskeletal: No acute or significant osseous findings.
IMPRESSION: 11 mm low density is noted in superior portion of right hepatic
lobe. While this may simply represent hemangioma or possibly cyst,
other pathology cannot be excluded. Further evaluation with MRI with
and without gadolinium is recommended.

Also noted is probable small periumbilical hernia which contains
possible rounded soft tissue abnormality measuring 21 x 20 x 11 mm.
Possible lymph node or mass cannot be excluded, and evaluation of
this abnormality on MRI is recommended as well.

These results will be called to the ordering clinician or
representative by the Radiologist Assistant, and communication
documented in the PACS or zVision Dashboard.

## 2020-12-29 MED ORDER — IOHEXOL 300 MG/ML  SOLN
100.0000 mL | Freq: Once | INTRAMUSCULAR | Status: AC | PRN
  Administered 2020-12-29: 100 mL via INTRAVENOUS

## 2020-12-30 ENCOUNTER — Other Ambulatory Visit: Payer: Self-pay

## 2020-12-30 NOTE — Progress Notes (Signed)
12/30/20  CT scan images viewed personally and discussed with radiology.  The patient was found to have an 11 mm mass in the right liver, possibly hemangioma vs cyst.  Also, there's concern for a possible 2 cm mass at the umbilical level of the abdominal wall.  She has had prior lap tubal ligation, umbilical hernia repair, and lap cholecystectomy.  Although this could be scar tissue, the shape of it seems like a mass instead.  Dr. Carlota Raspberry is recommending MRI abdomen w/wo contrast.  I have called the patient and explained the results to her.  She wants to proceed with MRI imaging.  Order will be placed.  Olean Ree, MD

## 2020-12-31 ENCOUNTER — Other Ambulatory Visit: Payer: Self-pay

## 2020-12-31 ENCOUNTER — Telehealth: Payer: Self-pay

## 2020-12-31 DIAGNOSIS — R1013 Epigastric pain: Secondary | ICD-10-CM

## 2020-12-31 NOTE — Telephone Encounter (Signed)
Pt is scheduled for an MRI on 01/01/2021 @ 2 pm (arrive at 1:30 pm) and NPO 4 hours prior.  Pt notified of appointment. Verbalizes understanding.

## 2021-01-01 ENCOUNTER — Ambulatory Visit
Admission: RE | Admit: 2021-01-01 | Discharge: 2021-01-01 | Disposition: A | Discharge status: Home / Self Care | Payer: Medicaid Other | Source: Ambulatory Visit | Attending: Surgery | Admitting: Surgery

## 2021-01-01 ENCOUNTER — Other Ambulatory Visit: Payer: Self-pay

## 2021-01-01 DIAGNOSIS — R1013 Epigastric pain: Secondary | ICD-10-CM | POA: Insufficient documentation

## 2021-01-01 DIAGNOSIS — G8929 Other chronic pain: Secondary | ICD-10-CM | POA: Diagnosis not present

## 2021-01-01 DIAGNOSIS — K7689 Other specified diseases of liver: Secondary | ICD-10-CM | POA: Diagnosis not present

## 2021-01-01 DIAGNOSIS — Z9049 Acquired absence of other specified parts of digestive tract: Secondary | ICD-10-CM | POA: Diagnosis not present

## 2021-01-01 IMAGING — MR MR ABDOMEN WO/W CM
18 series · 48 of 48 positions shown · IV contrast (gadavist)
Comparison: [DATE] of [DATE], [DATE] [REDACTED] CT evaluation.

CLINICAL DATA: A 40-year-old female presents with chronic pain in
the epigastrium.

EXAM:
MRI ABDOMEN WITHOUT AND WITH CONTRAST
TECHNIQUE: Multiplanar multisequence MR imaging of the abdomen was performed
both before and after the administration of intravenous contrast.
CONTRAST:  7mL GADAVIST GADOBUTROL 1 MMOL/ML IV SOLN

[Series 3: T2 · coronal · 6.0mm · 1.19mm/px · 2 of 32 slices shown (1 of 2)]
[im 1/32]
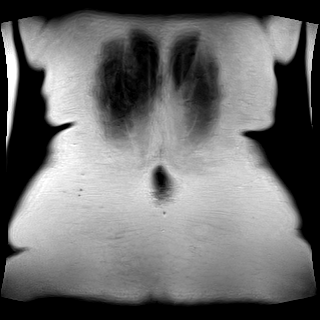
[im 32/32]
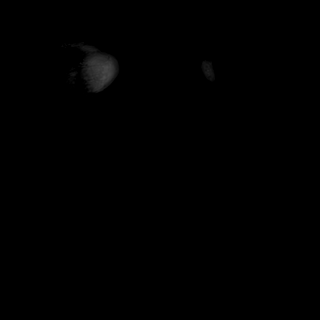

[Series 4: T2 · axial · 6.0mm · 1.19mm/px · z∈[-29,+238]mm · 2 of 38 slices shown (2 of 2)]
[im 1/38]
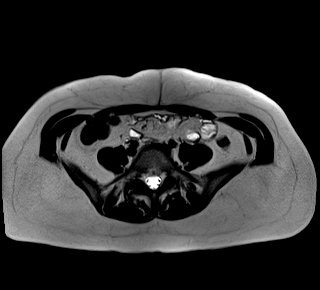
[im 38/38]
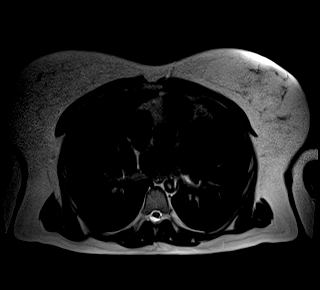

[Series 6: T2 fat-sat · axial · 6.0mm · 1.19mm/px · z∈[-29,+238]mm · 2 of 38 slices shown]
[im 1/38]
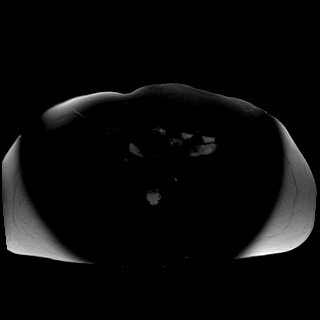
[im 38/38]
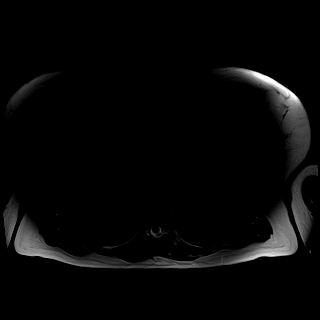

[Series 7: ax dwi_tracew · axial · 6.0mm · 1.42mm/px · z∈[-29,+238]mm · 5 of 114 slices shown]
[im 1/114]
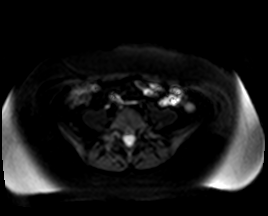
[im 29/114]
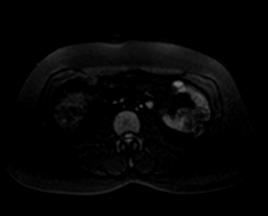
[im 57/114]
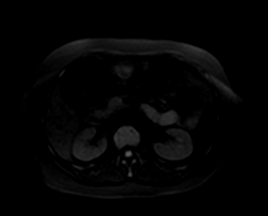
[im 85/114]
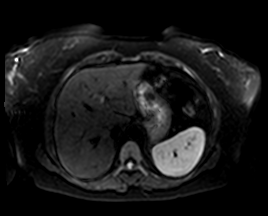
[im 114/114]
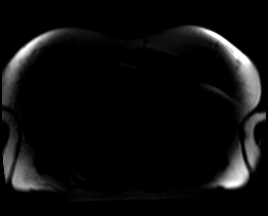

[Series 8: ax dwi_adc · axial · 6.0mm · 1.42mm/px · 1 of 38 slices shown]
[im 1/38]
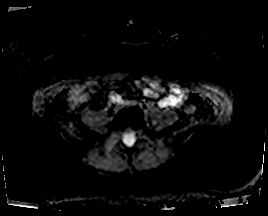

[Series 9: in & out · axial · 3.0mm · 1.19mm/px · z∈[-38,+247]mm · 3 of 96 slices shown (1 of 2)]
[im 1/96]
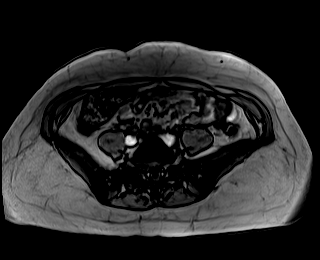
[im 48/96]
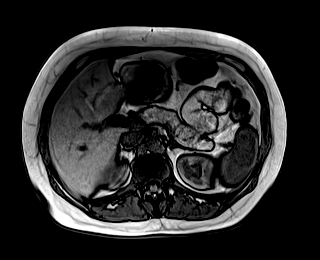
[im 96/96]
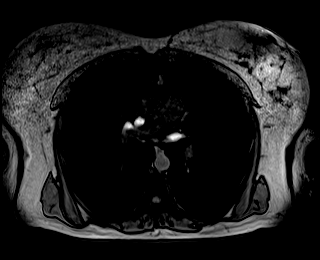

[Series 10: in & out · axial · 3.0mm · 1.19mm/px · z∈[-38,+247]mm · 3 of 96 slices shown (2 of 2)]
[im 1/96]
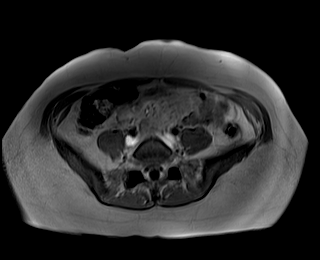
[im 48/96]
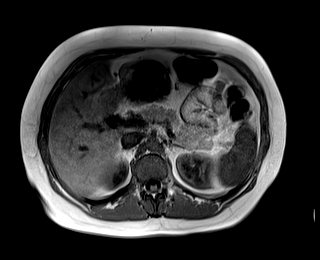
[im 96/96]
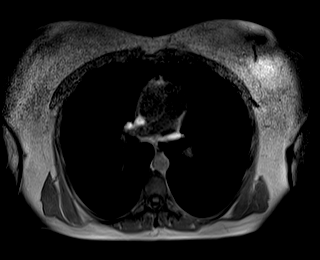

[Series 11: bSSFP · axial · 6.0mm · 0.74mm/px · 1 of 38 slices shown]
[im 1/38]
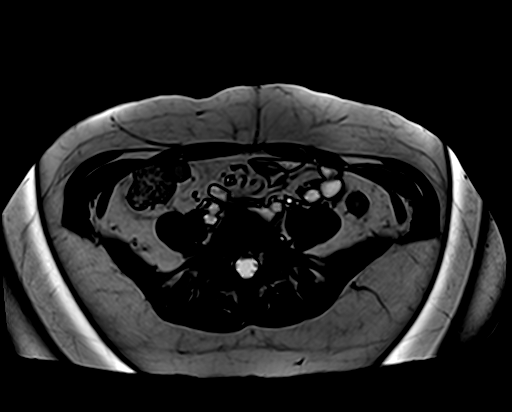

[Series 12: T1 dynamic fat-sat · axial · non-contrast · 3.0mm · 1.19mm/px · z∈[-38,+247]mm · 3 of 96 slices shown (1 of 5)]
[im 1/96]
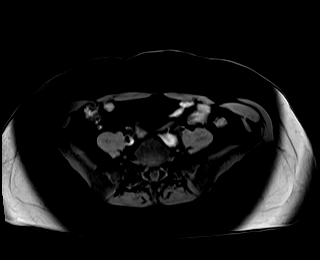
[im 48/96]
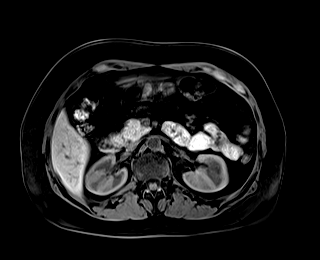
[im 96/96]
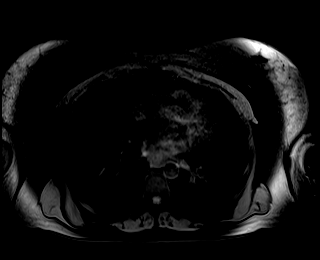

[Series 13: T1 dynamic fat-sat post-contrast · axial · 3.0mm · 1.19mm/px · z∈[-38,+247]mm · 3 of 96 slices shown (1 of 4)]
[im 1/96]
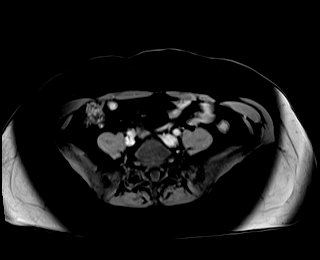
[im 48/96]
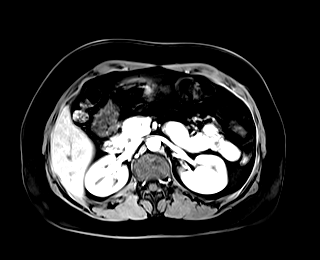
[im 96/96]
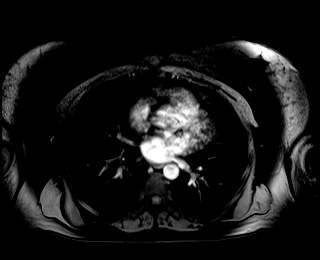

[Series 14: T1 dynamic fat-sat · axial · 3.0mm · 1.19mm/px · z∈[-38,+247]mm · 3 of 96 slices shown (2 of 5)]
[im 1/96]
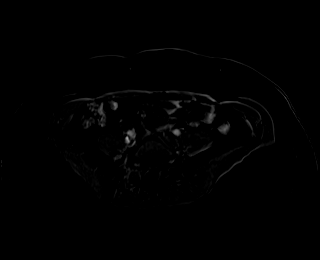
[im 48/96]
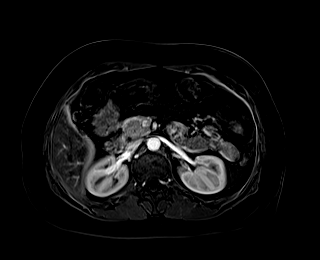
[im 96/96]
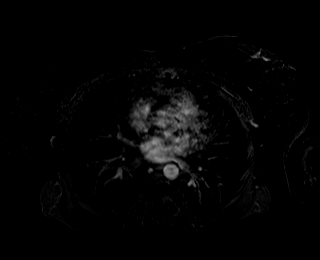

[Series 15: T1 dynamic fat-sat post-contrast · axial · 3.0mm · 1.19mm/px · z∈[-38,+247]mm · 3 of 96 slices shown (2 of 4)]
[im 1/96]
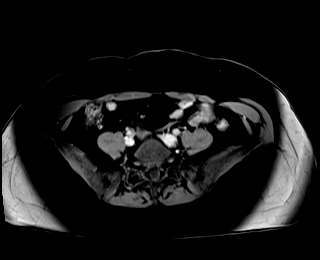
[im 48/96]
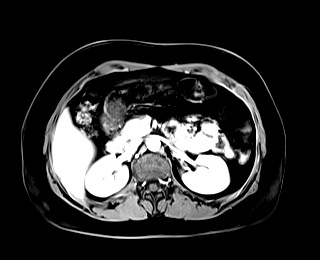
[im 96/96]
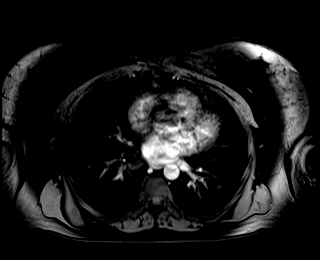

[Series 16: T1 dynamic fat-sat · axial · 3.0mm · 1.19mm/px · z∈[-38,+247]mm · 3 of 96 slices shown (3 of 5)]
[im 1/96]
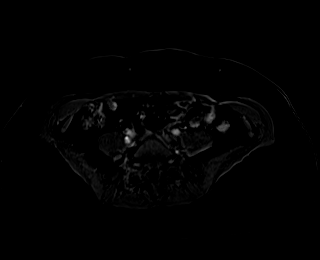
[im 48/96]
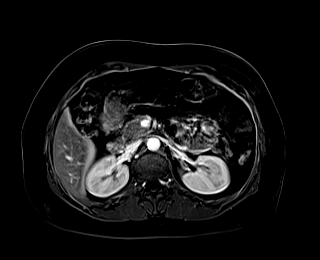
[im 96/96]
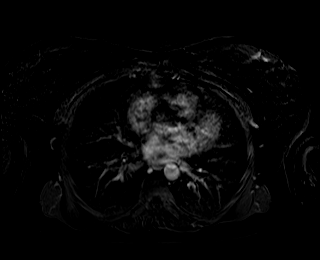

[Series 17: T1 dynamic fat-sat post-contrast · axial · 3.0mm · 1.19mm/px · z∈[-38,+247]mm · 3 of 96 slices shown (3 of 4)]
[im 1/96]
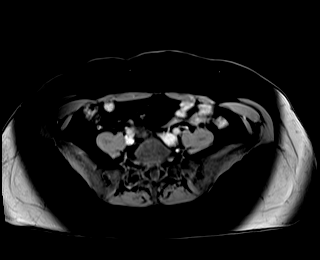
[im 48/96]
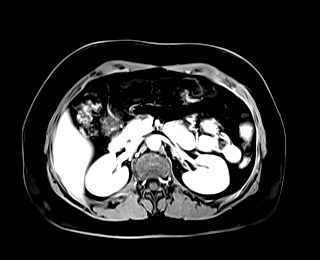
[im 96/96]
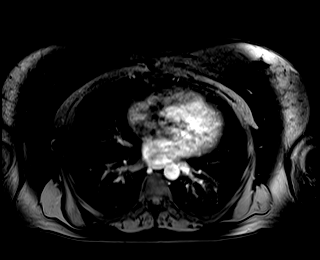

[Series 18: T1 dynamic fat-sat · axial · 3.0mm · 1.19mm/px · z∈[-38,+247]mm · 3 of 96 slices shown (4 of 5)]
[im 1/96]
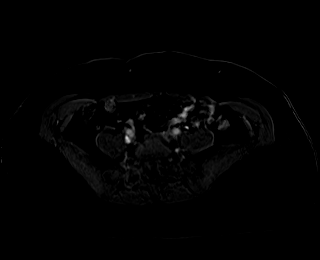
[im 48/96]
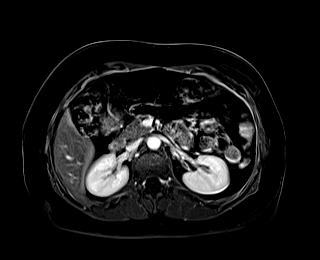
[im 96/96]
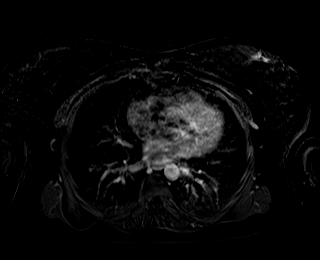

[Series 19: T1 dynamic post-contrast · coronal · 3.0mm · 1.31mm/px · 2 of 72 slices shown]
[im 1/72]
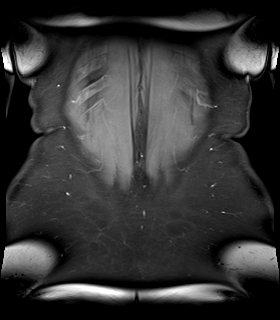
[im 72/72]
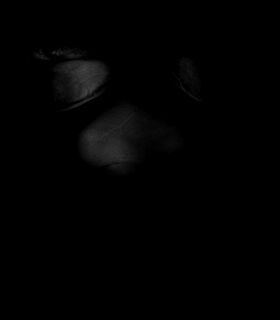

[Series 20: T1 dynamic fat-sat post-contrast · axial · 3.0mm · 1.19mm/px · z∈[-38,+247]mm · 3 of 96 slices shown (4 of 4)]
[im 1/96]
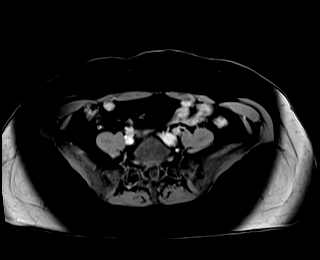
[im 48/96]
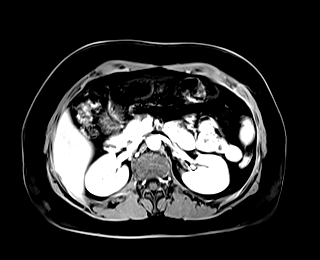
[im 96/96]
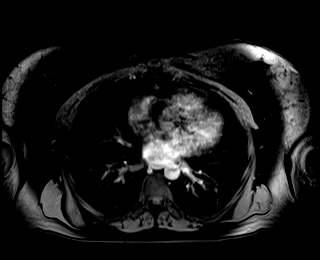

[Series 21: T1 dynamic fat-sat · axial · 3.0mm · 1.19mm/px · z∈[-38,+247]mm · 3 of 96 slices shown (5 of 5)]
[im 1/96]
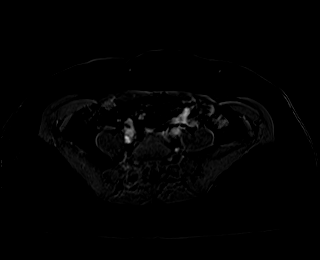
[im 48/96]
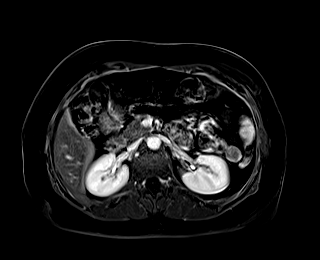
[im 96/96]
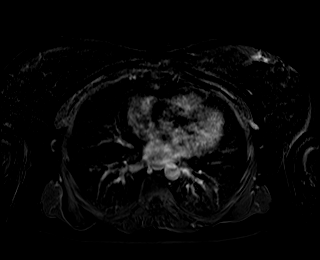

[48 of 48 positions shown; findings below may reference images not displayed]

FINDINGS: Lower chest: No effusion or consolidation at the lung bases, limited
assessment on abdominal MRI.

Hepatobiliary: Liver with smooth contours. No substantial fat or
iron deposition. Post cholecystectomy without biliary duct dilation.
Small hepatic lesions seen on the previous evaluation are
hyperintense on T2 and are well-circumscribed. Peripheral, nodular
and discontinuous enhancement is noted within the lesions. Largest
approximately 14 mm with lobular contours in the posterior RIGHT
hemi liver (image 19/15) centripetal filling is illustrated on the
later phase images. Hepatic subsegment VIII. Smaller lesion in the
posterior margin of the RIGHT hemi liver hepatic subsegment VII.

Portal vein is patent. Hepatic veins are patent. No signs of
background liver disease.

Pancreas: Normal intrinsic T1 signal. No ductal dilation or sign of
inflammation. No focal lesion.

Spleen:  Normal size and contour.

Adrenals/Urinary Tract:  Adrenal glands are normal.

Symmetric renal enhancement. No hydronephrosis. No perinephric
stranding.

Stomach/Bowel: Unremarkable to the extent evaluated on abdominal
MRI.

Vascular/Lymphatic: No pathologically enlarged lymph nodes
identified. No abdominal aortic aneurysm demonstrated.

Other: No ascites. Area about the umbilicus is not imaged on all
sequences showing potential low level enhancement.

Musculoskeletal: No suspicious bone lesions identified.
IMPRESSION: Small hepatic lesions seen on the previous evaluation represent
benign hemangiomas.

No signs of background liver disease.

Post cholecystectomy without biliary duct dilation.

Area below the umbilicus not imaged on all sequences is favored to
reflect changes of umbilical herniorrhaphy with mesh. Correlate with
operative history to determine whether mesh was utilized. Mild
enhancement could reflect inflammation in the area of the hernia
repair surrounding mesh, mesh may also have herniated through the
abdominal wall defect, there are no priors for comparison. Correlate
with operative report to determine whether mesh was placed at the
time of surgery.

## 2021-01-01 MED ORDER — GADOBUTROL 1 MMOL/ML IV SOLN
7.0000 mL | Freq: Once | INTRAVENOUS | Status: AC | PRN
  Administered 2021-01-01: 7 mL via INTRAVENOUS

## 2021-01-04 ENCOUNTER — Telehealth: Payer: Self-pay | Admitting: Surgery

## 2021-01-04 ENCOUNTER — Telehealth: Payer: Self-pay | Admitting: *Deleted

## 2021-01-04 NOTE — Telephone Encounter (Signed)
I called the patient. She is scheduled for follow up in the office with Dr. Hampton Abbot for 02/01/21.  I will also arrange for her surgery for 02/09/21.

## 2021-01-04 NOTE — Telephone Encounter (Signed)
Called and spoke with the patient and let her know if she was comfortable with returning to work at that time she may. She understands that she will be under lifting restrictions.

## 2021-01-04 NOTE — Progress Notes (Signed)
01/04/21  Called patient and discussed results of her CT scan and her MRI.  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  Also an area of irregularity at the umbilicus which is favored to be sequela from her umbilical hernia repair.  She is unsure if mesh was used in any of her prior surgeries, and I do not have access to operative notes.  However, on imaging, it appears that perhaps she did have mesh placed and she's now having a recurrence.  Will have her follow up in office next month and we'll set her up for surgery towards the end of November for likely robotic assisted umbilical hernia repair and possible mesh excision.  Olean Ree, MD

## 2021-01-04 NOTE — Telephone Encounter (Signed)
Patient is scheduled for surgery on 01/29/21 Dr Hampton Abbot- umbilical hernia repair and she wants to know would she be able to return to work in a week or less. She has a sit down/office job. Please call and advise

## 2021-01-04 NOTE — Telephone Encounter (Signed)
Surgery scheduled for 02/09/21.

## 2021-01-04 NOTE — Telephone Encounter (Signed)
Patient has been advised of Pre-Admission date/time, COVID Testing date and Surgery date.  Surgery Date: 02/09/21 Preadmission Testing Date: 01/29/21 (phone 8a-1p) Covid Testing Date: Not needed.     Patient has been made aware to call [phone], between 1-3:00pm the day before surgery, to find out what time to arrive for surgery.

## 2021-01-04 NOTE — Telephone Encounter (Signed)
Incoming call from patient.  She said that on Friday after 3:00, she got a message, she believed it was from Dr. Hampton Abbot, but not sure.  She said that she was not able to listen to the message.  She did have MRI on 01/01/21 and thinks that maybe perhaps it was regarding her MRI.  I do not see a telephone encounter, so not sure.  Please call patient. Thank you.

## 2021-01-29 ENCOUNTER — Other Ambulatory Visit: Payer: Self-pay

## 2021-01-29 ENCOUNTER — Other Ambulatory Visit
Admission: RE | Admit: 2021-01-29 | Discharge: 2021-01-29 | Disposition: A | Discharge status: Home / Self Care | Payer: Medicaid Other | Source: Ambulatory Visit | Attending: Surgery | Admitting: Surgery

## 2021-01-29 HISTORY — DX: Nausea with vomiting, unspecified: R11.2

## 2021-01-29 HISTORY — DX: Other specified postprocedural states: Z98.890

## 2021-01-29 NOTE — Patient Instructions (Addendum)
Your procedure is scheduled on: 02/09/21 - Tuesday Report to the Registration Desk on the 1st floor of the [facility]. To find out your arrival time, please call [phone] between 1PM - 3PM on: 02/08/21 - Monday  REMEMBER: Instructions that are not followed completely may result in serious medical risk, up to and including death; or upon the discretion of your surgeon and anesthesiologist your surgery may need to be rescheduled.  Do not eat food after midnight the night before surgery.  No gum chewing, lozengers or hard candies.  You may however, drink CLEAR liquids up to 2 hours before you are scheduled to arrive for your surgery. Do not drink anything within 2 hours of your scheduled arrival time.  Clear liquids include: - water  - apple juice without pulp - gatorade (not RED, PURPLE, OR BLUE) - black coffee or tea (Do NOT add milk or creamers to the coffee or tea) Do NOT drink anything that is not on this list.  TAKE THESE MEDICATIONS THE MORNING OF SURGERY WITH A SIP OF WATER: NONE  One week prior to surgery:01/31/21  Stop Anti-inflammatories (NSAIDS) such as Advil, Aleve, Ibuprofen, Motrin, Naproxen, Naprosyn and Aspirin based products such as Excedrin, Goodys Powder, BC Powder.  Stop ANY OVER THE COUNTER supplements until after surgery.  You may take Tylenol as directed if needed for pain up until the day of surgery.  No Alcohol for 24 hours before or after surgery.  No Smoking including e-cigarettes for 24 hours prior to surgery.  No chewable tobacco products for at least 6 hours prior to surgery.  No nicotine patches on the day of surgery.  Do not use any "recreational" drugs for at least a week prior to your surgery.  Please be advised that the combination of cocaine and anesthesia may have negative outcomes, up to and including death. If you test positive for cocaine, your surgery will be cancelled.  On the morning of surgery brush your teeth with toothpaste and  water, you may rinse your mouth with mouthwash if you wish. Do not swallow any toothpaste or mouthwash.  Use CHG Soap or wipes as directed on instruction sheet.  Do not wear jewelry, make-up, hairpins, clips or nail polish.  Do not wear lotions, powders, or perfumes.   Do not shave body from the neck down 48 hours prior to surgery just in case you cut yourself which could leave a site for infection.  Also, freshly shaved skin may become irritated if using the CHG soap.  Contact lenses, hearing aids and dentures may not be worn into surgery.  Do not bring valuables to the hospital. [facility] is not responsible for any missing/lost belongings or valuables.   Notify your doctor if there is any change in your medical condition (cold, fever, infection).  Wear comfortable clothing (specific to your surgery type) to the hospital.  After surgery, you can help prevent lung complications by doing breathing exercises.  Take deep breaths and cough every 1-2 hours. Your doctor may order a device called an Incentive Spirometer to help you take deep breaths. When coughing or sneezing, hold a pillow firmly against your incision with both hands. This is called "splinting." Doing this helps protect your incision. It also decreases belly discomfort.  If you are being admitted to the hospital overnight, leave your suitcase in the car. After surgery it may be brought to your room.  If you are being discharged the day of surgery, you will not be allowed  to drive home. You will need a responsible adult (18 years or older) to drive you home and stay with you that night.   If you are taking public transportation, you will need to have a responsible adult (18 years or older) with you. Please confirm with your physician that it is acceptable to use public transportation.   Please call the [facility] at [phone] if you have any questions about these instructions.  Surgery Visitation  Policy:  Patients undergoing a surgery or procedure may have one family member or support person with them as long as that person is not COVID-19 positive or experiencing its symptoms.  That person may remain in the waiting area during the procedure and may rotate out with other people.  Inpatient Visitation:    Visiting hours are 7 a.m. to 8 p.m. Up to two visitors ages 16+ are allowed at one time in a patient room. The visitors may rotate out with other people during the day. Visitors must check out when they leave, or other visitors will not be allowed. One designated support person may remain overnight. The visitor must pass COVID-19 screenings, use hand sanitizer when entering and exiting the patient's room and wear a mask at all times, including in the patient's room. Patients must also wear a mask when staff or their visitor are in the room. Masking is required regardless of vaccination status.

## 2021-02-01 ENCOUNTER — Ambulatory Visit: Payer: Medicaid Other | Admitting: Surgery

## 2021-02-01 ENCOUNTER — Encounter: Payer: Self-pay | Admitting: Surgery

## 2021-02-01 ENCOUNTER — Other Ambulatory Visit: Payer: Self-pay

## 2021-02-01 VITALS — BP 128/74 | HR 62 | Temp 98.3°F | Ht 64.0 in | Wt 163.0 lb

## 2021-02-01 DIAGNOSIS — K429 Umbilical hernia without obstruction or gangrene: Secondary | ICD-10-CM

## 2021-02-01 NOTE — H&P (View-Only) (Signed)
02/01/2021  History of Present Illness: Lauren Boyd is a 40 y.o. female presenting for follow up of an umbilical hernia and for H&P update.  She had a CT scan on 12/29/2020 which showed a possible 11 mm mass in the right liver lobe as well as a small periumbilical hernia with a possible 2 cm mass.  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  It is uncertain if the patient has had any mesh in the past however she did have a prior hernia repair there.  Patient presents today for follow-up.  She is currently scheduled for a robotic assisted umbilical hernia repair on 02/09/2021.  She denies any new issues with the umbilicus, she reported having another episode of chest pain.  She has been seen by cardiology in the past and has had thorough work-up for this and there was no cardiac etiology.  Past Medical History: Past Medical History:  Diagnosis Date   History of 2019 novel coronavirus disease (COVID-19) 06/20/2019   PONV (postoperative nausea and vomiting)      Past Surgical History: Past Surgical History:  Procedure Laterality Date   CHOLECYSTECTOMY  2018   HERNIA REPAIR     TUBAL LIGATION      Home Medications: Prior to Admission medications   Medication Sig Start Date End Date Taking? Authorizing Provider  cyanocobalamin 1000 MCG tablet Take 1,000 mcg by mouth daily.   Yes [provider]  ferrous sulfate 324 MG TBEC Take 324 mg by mouth daily with breakfast.   Yes [provider]  ibuprofen (ADVIL) 200 MG tablet Take 200 mg by mouth every 6 (six) hours as needed for mild pain or moderate pain.   Yes [provider]  Multiple Vitamin (MULTIVITAMIN) tablet Take 1 tablet by mouth daily.   Yes [provider]  VITAMIN D PO Take 1 capsule by mouth 3 (three) times a week.   Yes [provider]    Allergies: No Known Allergies  Review of Systems: Review of Systems  Constitutional:  Negative for chills and fever.  Respiratory:  Negative for shortness of breath.   Cardiovascular:  Negative for chest pain.  Gastrointestinal:  Positive for abdominal pain. Negative for nausea and vomiting.   Physical Exam BP 128/74   Pulse 62   Temp 98.3 F (36.8 C) (Oral)   Ht 5\' 4"  (1.626 m)   Wt 163 lb (73.9 kg)   LMP 01/04/2021 (Approximate)   SpO2 98%   BMI 27.98 kg/m  CONSTITUTIONAL: No acute distress HEENT:  Normocephalic, atraumatic, extraocular motion intact. RESPIRATORY:  Lungs are clear, and breath sounds are equal bilaterally. Normal respiratory effort without pathologic use of accessory muscles. CARDIOVASCULAR: Heart is regular without murmurs, gallops, or rubs. GI: The abdomen is soft, non-distended, with some discomfort to palpation at the umbilicus.  There is an area of firmness consistent with prior scar tissue and a possible umbilical defect.  No overlying skin changes to indicate strangulation.  NEUROLOGIC:  Motor and sensation is grossly normal.  Cranial nerves are grossly intact. PSYCH:  Alert and oriented to person, place and time. Affect is normal.  Labs/Imaging: CT abdomen/pelvis 12/29/20: IMPRESSION: 11 mm low density is noted in superior portion of right hepatic lobe. While this may simply represent hemangioma or possibly cyst, other pathology cannot be excluded. Further evaluation with MRI  with and without gadolinium is recommended.   Also noted is probable small periumbilical hernia which contains possible rounded soft tissue abnormality measuring 21 x 20 x 11 mm. Possible lymph node or mass cannot be excluded, and evaluation of this abnormality on MRI is recommended as well.   MRI abdomen 01/01/21: IMPRESSION: Small hepatic lesions seen on the previous evaluation represent benign hemangiomas.   No signs of background liver disease.    Post cholecystectomy without biliary duct dilation.   Area below the umbilicus not imaged on all sequences is favored to reflect changes of umbilical herniorrhaphy with mesh. Correlate with operative history to determine whether mesh was utilized. Mild enhancement could reflect inflammation in the area of the hernia repair surrounding mesh, mesh may also have herniated through the abdominal wall defect, there are no priors for comparison. Correlate with operative report to determine whether mesh was placed at the time of surgery.    Assessment and Plan: This is a 40 y.o. female with likely umbilical hernia.  --Discussed with the patient again our plan for a robotic assisted umbilical/incisional hernia repair.  She's currently scheduled for 02/09/21.  No current changes to prohibit surgery.  Reviewed again with her the surgery at length, including risks of bleeding, infection, injury to surrounding structures, post-op activity restrictions, pain control, and she's willing to proceed. --OR on 02/09/21.  I spent 25 minutes dedicated to the care of this patient on the date of this encounter to include pre-visit review of records, face-to-face time with the patient discussing diagnosis and management, and any post-visit coordination of care.   Melvyn Neth, [facility]

## 2021-02-01 NOTE — Patient Instructions (Addendum)
If you have any concerns or questions, please feel free to call our office.   Umbilical Hernia, Adult A hernia is a bulge of tissue that pushes through an opening between muscles. An umbilical hernia happens in the abdomen, near the belly button (umbilicus). The hernia may contain tissues from the small intestine, large intestine, or fatty tissue covering the intestines. Umbilical hernias in adults tend to get worse over time, and they require surgical treatment. There are different types of umbilical hernias, including: Indirect hernia. This type is located just above or below the umbilicus. It is the most common type of umbilical hernia in adults. Direct hernia. This type forms through an opening formed by the umbilicus. Reducible hernia. This type of hernia comes and goes. It may be visible only when you strain, lift something heavy, or cough. This type of hernia can be pushed back into the abdomen (reduced). Incarcerated hernia. This type traps abdominal tissue inside the hernia. This type of hernia cannot be reduced. Strangulated hernia. This type of hernia cuts off blood flow to the tissues inside the hernia. The tissues can start to die if this happens. This type of hernia requires emergency treatment. What are the causes? An umbilical hernia happens when tissue inside the abdomen presses on a weak area of the abdominal muscles. What increases the risk? You may have a greater risk of this condition if you: Are obese. Have had several pregnancies. Have a buildup of fluid inside your abdomen. Have had surgery that weakens the abdominal muscles. What are the signs or symptoms? The main symptom of this condition is a painless bulge at or near the belly button. A reducible hernia may be visible only when you strain, lift something heavy, or cough. Other symptoms may include: Dull pain. A feeling of pressure. Symptoms of a strangulated hernia may include: Pain that gets increasingly  worse. Nausea and vomiting. Pain when pressing on the hernia. Skin over the hernia becoming red or purple. Constipation. Blood in the stool. How is this diagnosed? This condition may be diagnosed based on: A physical exam. You may be asked to cough or strain while standing. These actions increase the pressure inside your abdomen and can force the hernia through the opening in your muscles. Your health care provider may try to reduce the hernia by pressing on it. Your symptoms and medical history. How is this treated? Surgery is the only treatment for an umbilical hernia. Surgery for a strangulated hernia is done as soon as possible. If you have a small hernia that is not incarcerated, you may need to lose weight before having surgery. Follow these instructions at home: Lose weight, if told by your health care provider. Do not try to push the hernia back in. Watch your hernia for any changes in color or size. Tell your health care provider if any changes occur. You may need to avoid activities that increase pressure on your hernia. Do not lift anything that is heavier than 10 lb (4.5 kg), or the limit that you are told, until your health care provider says that it is safe. Take over-the-counter and prescription medicines only as told by your health care provider. Keep all follow-up visits. This is important. Contact a health care provider if: Your hernia gets larger. Your hernia becomes painful. Get help right away if: You develop sudden, severe pain near the area of your hernia. You have pain as well as nausea or vomiting. You have pain and the skin over your hernia  changes color. You develop a fever or chills. Summary A hernia is a bulge of tissue that pushes through an opening between muscles. An umbilical hernia happens near the belly button. Surgery is the only treatment for an umbilical hernia. Do not try to push your hernia back in. Keep all follow-up visits. This is  important. This information is not intended to replace advice given to you by your health care provider. Make sure you discuss any questions you have with your health care provider. Document Revised: 10/07/2019 Document Reviewed: 10/07/2019 Elsevier Patient Education  [employer].

## 2021-02-01 NOTE — Progress Notes (Signed)
02/01/2021  History of Present Illness: Lauren Boyd is a 40 y.o. female presenting for follow up of an umbilical hernia and for H&P update.  She had a CT scan on 12/29/2020 which showed a possible 11 mm mass in the right liver lobe as well as a small periumbilical hernia with a possible 2 cm mass.  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  It is uncertain if the patient has had any mesh in the past however she did have a prior hernia repair there.  Patient presents today for follow-up.  She is currently scheduled for a robotic assisted umbilical hernia repair on 02/09/2021.  She denies any new issues with the umbilicus, she reported having another episode of chest pain.  She has been seen by cardiology in the past and has had thorough work-up for this and there was no cardiac etiology.  Past Medical History: Past Medical History:  Diagnosis Date   History of 2019 novel coronavirus disease (COVID-19) 06/20/2019   PONV (postoperative nausea and vomiting)      Past Surgical History: Past Surgical History:  Procedure Laterality Date   CHOLECYSTECTOMY  2018   HERNIA REPAIR     TUBAL LIGATION      Home Medications: Prior to Admission medications   Medication Sig Start Date End Date Taking? Authorizing Provider  cyanocobalamin 1000 MCG tablet Take 1,000 mcg by mouth daily.   Yes [provider]  ferrous sulfate 324 MG TBEC Take 324 mg by mouth daily with breakfast.   Yes [provider]  ibuprofen (ADVIL) 200 MG tablet Take 200 mg by mouth every 6 (six) hours as needed for mild pain or moderate pain.   Yes [provider]  Multiple Vitamin (MULTIVITAMIN) tablet Take 1 tablet by mouth daily.   Yes [provider]  VITAMIN D PO Take 1 capsule by mouth 3 (three) times a week.   Yes [provider]    Allergies: No Known Allergies  Review of Systems: Review of Systems  Constitutional:  Negative for chills and fever.  Respiratory:  Negative for shortness of breath.   Cardiovascular:  Negative for chest pain.  Gastrointestinal:  Positive for abdominal pain. Negative for nausea and vomiting.   Physical Exam BP 128/74   Pulse 62   Temp 98.3 F (36.8 C) (Oral)   Ht 5\' 4"  (1.626 m)   Wt 163 lb (73.9 kg)   LMP 01/04/2021 (Approximate)   SpO2 98%   BMI 27.98 kg/m  CONSTITUTIONAL: No acute distress HEENT:  Normocephalic, atraumatic, extraocular motion intact. RESPIRATORY:  Lungs are clear, and breath sounds are equal bilaterally. Normal respiratory effort without pathologic use of accessory muscles. CARDIOVASCULAR: Heart is regular without murmurs, gallops, or rubs. GI: The abdomen is soft, non-distended, with some discomfort to palpation at the umbilicus.  There is an area of firmness consistent with prior scar tissue and a possible umbilical defect.  No overlying skin changes to indicate strangulation.  NEUROLOGIC:  Motor and sensation is grossly normal.  Cranial nerves are grossly intact. PSYCH:  Alert and oriented to person, place and time. Affect is normal.  Labs/Imaging: CT abdomen/pelvis 12/29/20: IMPRESSION: 11 mm low density is noted in superior portion of right hepatic lobe. While this may simply represent hemangioma or possibly cyst, other pathology cannot be excluded. Further evaluation with MRI  with and without gadolinium is recommended.   Also noted is probable small periumbilical hernia which contains possible rounded soft tissue abnormality measuring 21 x 20 x 11 mm. Possible lymph node or mass cannot be excluded, and evaluation of this abnormality on MRI is recommended as well.   MRI abdomen 01/01/21: IMPRESSION: Small hepatic lesions seen on the previous evaluation represent benign hemangiomas.   No signs of background liver disease.    Post cholecystectomy without biliary duct dilation.   Area below the umbilicus not imaged on all sequences is favored to reflect changes of umbilical herniorrhaphy with mesh. Correlate with operative history to determine whether mesh was utilized. Mild enhancement could reflect inflammation in the area of the hernia repair surrounding mesh, mesh may also have herniated through the abdominal wall defect, there are no priors for comparison. Correlate with operative report to determine whether mesh was placed at the time of surgery.    Assessment and Plan: This is a 40 y.o. female with likely umbilical hernia.  --Discussed with the patient again our plan for a robotic assisted umbilical/incisional hernia repair.  She's currently scheduled for 02/09/21.  No current changes to prohibit surgery.  Reviewed again with her the surgery at length, including risks of bleeding, infection, injury to surrounding structures, post-op activity restrictions, pain control, and she's willing to proceed. --OR on 02/09/21.  I spent 25 minutes dedicated to the care of this patient on the date of this encounter to include pre-visit review of records, face-to-face time with the patient discussing diagnosis and management, and any post-visit coordination of care.   Melvyn Neth, [facility]

## 2021-02-09 ENCOUNTER — Other Ambulatory Visit: Payer: Self-pay

## 2021-02-09 ENCOUNTER — Encounter
Admission: RE | Disposition: A | Discharge status: Home / Self Care | Payer: Self-pay | Source: Home / Self Care | Attending: Surgery

## 2021-02-09 ENCOUNTER — Ambulatory Visit: Payer: Medicaid Other | Admitting: Certified Registered"

## 2021-02-09 ENCOUNTER — Ambulatory Visit
Admission: RE | Admit: 2021-02-09 | Discharge: 2021-02-09 | Disposition: A | Discharge status: Home / Self Care | Payer: Medicaid Other | Attending: Surgery | Admitting: Surgery

## 2021-02-09 ENCOUNTER — Encounter: Payer: Self-pay | Admitting: Surgery

## 2021-02-09 DIAGNOSIS — K429 Umbilical hernia without obstruction or gangrene: Secondary | ICD-10-CM | POA: Diagnosis not present

## 2021-02-09 DIAGNOSIS — K420 Umbilical hernia with obstruction, without gangrene: Secondary | ICD-10-CM | POA: Diagnosis not present

## 2021-02-09 DIAGNOSIS — Z87891 Personal history of nicotine dependence: Secondary | ICD-10-CM | POA: Diagnosis not present

## 2021-02-09 HISTORY — PX: INSERTION OF MESH: SHX5868

## 2021-02-09 LAB — POCT PREGNANCY, URINE: Preg Test, Ur: NEGATIVE

## 2021-02-09 SURGERY — REPAIR, HERNIA, UMBILICAL, ROBOT-ASSISTED
Anesthesia: General

## 2021-02-09 MED ORDER — SUGAMMADEX SODIUM 200 MG/2ML IV SOLN
INTRAVENOUS | Status: DC | PRN
  Administered 2021-02-09: 170 mg via INTRAVENOUS

## 2021-02-09 MED ORDER — FENTANYL CITRATE (PF) 100 MCG/2ML IJ SOLN
INTRAMUSCULAR | Status: AC
  Filled 2021-02-09: qty 2

## 2021-02-09 MED ORDER — DROPERIDOL 2.5 MG/ML IJ SOLN
0.6250 mg | Freq: Once | INTRAMUSCULAR | Status: DC | PRN
  Filled 2021-02-09: qty 2

## 2021-02-09 MED ORDER — ACETAMINOPHEN 500 MG PO TABS: 1000.0000 mg | ORAL_TABLET | Freq: Four times a day (QID) | ORAL | Status: DC | PRN

## 2021-02-09 MED ORDER — DEXAMETHASONE SODIUM PHOSPHATE 10 MG/ML IJ SOLN
INTRAMUSCULAR | Status: DC | PRN
Start: 2021-02-09 — End: 2021-02-09
  Administered 2021-02-09: 10 mg via INTRAVENOUS

## 2021-02-09 MED ORDER — PROMETHAZINE HCL 25 MG/ML IJ SOLN: 6.2500 mg | INTRAMUSCULAR | Status: DC | PRN

## 2021-02-09 MED ORDER — BUPIVACAINE LIPOSOME 1.3 % IJ SUSP: 20.0000 mL | Freq: Once | INTRAMUSCULAR | Status: DC

## 2021-02-09 MED ORDER — HYDROMORPHONE HCL 1 MG/ML IJ SOLN
INTRAMUSCULAR | Status: AC
  Filled 2021-02-09: qty 1

## 2021-02-09 MED ORDER — CHLORHEXIDINE GLUCONATE 0.12 % MT SOLN
15.0000 mL | Freq: Once | OROMUCOSAL | Status: AC
  Administered 2021-02-09: 15 mL via OROMUCOSAL

## 2021-02-09 MED ORDER — BUPIVACAINE LIPOSOME 1.3 % IJ SUSP
INTRAMUSCULAR | Status: DC | PRN
  Administered 2021-02-09: 20 mL

## 2021-02-09 MED ORDER — FAMOTIDINE 20 MG PO TABS
20.0000 mg | ORAL_TABLET | Freq: Once | ORAL | Status: AC
  Administered 2021-02-09: 20 mg via ORAL

## 2021-02-09 MED ORDER — PROPOFOL 10 MG/ML IV BOLUS
INTRAVENOUS | Status: DC | PRN
  Administered 2021-02-09: 25 ug/kg/min via INTRAVENOUS

## 2021-02-09 MED ORDER — OXYCODONE HCL 5 MG/5ML PO SOLN: 5.0000 mg | Freq: Once | ORAL | Status: AC | PRN

## 2021-02-09 MED ORDER — CEFAZOLIN SODIUM-DEXTROSE 2-4 GM/100ML-% IV SOLN
2.0000 g | INTRAVENOUS | Status: AC
  Administered 2021-02-09: 2 g via INTRAVENOUS

## 2021-02-09 MED ORDER — FENTANYL CITRATE (PF) 100 MCG/2ML IJ SOLN
INTRAMUSCULAR | Status: DC | PRN
  Administered 2021-02-09: 100 ug via INTRAVENOUS
  Administered 2021-02-09 (×2): 50 ug via INTRAVENOUS

## 2021-02-09 MED ORDER — PROPOFOL 10 MG/ML IV BOLUS
INTRAVENOUS | Status: DC | PRN
  Administered 2021-02-09: 160 mg via INTRAVENOUS

## 2021-02-09 MED ORDER — IBUPROFEN 800 MG PO TABS: 800.0000 mg | ORAL_TABLET | Freq: Three times a day (TID) | ORAL | 1 refills | Status: DC | PRN

## 2021-02-09 MED ORDER — OXYCODONE HCL 5 MG PO TABS
5.0000 mg | ORAL_TABLET | Freq: Once | ORAL | Status: AC | PRN
  Administered 2021-02-09: 5 mg via ORAL

## 2021-02-09 MED ORDER — GABAPENTIN 300 MG PO CAPS
300.0000 mg | ORAL_CAPSULE | ORAL | Status: AC
  Administered 2021-02-09: 300 mg via ORAL

## 2021-02-09 MED ORDER — MIDAZOLAM HCL 2 MG/2ML IJ SOLN
INTRAMUSCULAR | Status: DC | PRN
  Administered 2021-02-09: 2 mg via INTRAVENOUS

## 2021-02-09 MED ORDER — OXYCODONE HCL 5 MG PO TABS: 5.0000 mg | ORAL_TABLET | ORAL | 0 refills | Status: DC | PRN

## 2021-02-09 MED ORDER — CHLORHEXIDINE GLUCONATE CLOTH 2 % EX PADS
6.0000 | MEDICATED_PAD | Freq: Once | CUTANEOUS | Status: AC
  Administered 2021-02-09: 6 via TOPICAL

## 2021-02-09 MED ORDER — ROCURONIUM BROMIDE 100 MG/10ML IV SOLN
INTRAVENOUS | Status: DC | PRN
  Administered 2021-02-09: 30 mg via INTRAVENOUS
  Administered 2021-02-09: 40 mg via INTRAVENOUS

## 2021-02-09 MED ORDER — MIDAZOLAM HCL 2 MG/2ML IJ SOLN
INTRAMUSCULAR | Status: AC
  Filled 2021-02-09: qty 2

## 2021-02-09 MED ORDER — CHLORHEXIDINE GLUCONATE CLOTH 2 % EX PADS: 6.0000 | MEDICATED_PAD | Freq: Once | CUTANEOUS | Status: DC

## 2021-02-09 MED ORDER — ORAL CARE MOUTH RINSE: 15.0000 mL | Freq: Once | OROMUCOSAL | Status: AC

## 2021-02-09 MED ORDER — LACTATED RINGERS IV SOLN: INTRAVENOUS | Status: DC

## 2021-02-09 MED ORDER — ACETAMINOPHEN 10 MG/ML IV SOLN: 1000.0000 mg | Freq: Once | INTRAVENOUS | Status: DC | PRN

## 2021-02-09 MED ORDER — BUPIVACAINE-EPINEPHRINE 0.25% -1:200000 IJ SOLN
INTRAMUSCULAR | Status: DC | PRN
  Administered 2021-02-09: 30 mL

## 2021-02-09 MED ORDER — PHENYLEPHRINE HCL (PRESSORS) 10 MG/ML IV SOLN
INTRAVENOUS | Status: DC | PRN
  Administered 2021-02-09 (×3): 100 ug via INTRAVENOUS

## 2021-02-09 MED ORDER — ACETAMINOPHEN 500 MG PO TABS
1000.0000 mg | ORAL_TABLET | ORAL | Status: AC
  Administered 2021-02-09: 1000 mg via ORAL

## 2021-02-09 MED ORDER — FENTANYL CITRATE (PF) 100 MCG/2ML IJ SOLN
25.0000 ug | INTRAMUSCULAR | Status: DC | PRN
  Administered 2021-02-09 (×2): 25 ug via INTRAVENOUS

## 2021-02-09 MED ORDER — PROPOFOL 10 MG/ML IV BOLUS
INTRAVENOUS | Status: AC
  Filled 2021-02-09: qty 40

## 2021-02-09 MED ORDER — DEXMEDETOMIDINE (PRECEDEX) IN NS 20 MCG/5ML (4 MCG/ML) IV SYRINGE
PREFILLED_SYRINGE | INTRAVENOUS | Status: DC | PRN
  Administered 2021-02-09: 8 ug via INTRAVENOUS
  Administered 2021-02-09: 12 ug via INTRAVENOUS

## 2021-02-09 MED ORDER — APREPITANT 40 MG PO CAPS
40.0000 mg | ORAL_CAPSULE | Freq: Once | ORAL | Status: AC
  Administered 2021-02-09: 40 mg via ORAL

## 2021-02-09 MED ORDER — PHENYLEPHRINE HCL (PRESSORS) 10 MG/ML IV SOLN
INTRAVENOUS | Status: AC
  Filled 2021-02-09: qty 1

## 2021-02-09 MED ORDER — HYDROMORPHONE HCL 1 MG/ML IJ SOLN
INTRAMUSCULAR | Status: DC | PRN
  Administered 2021-02-09 (×2): .5 mg via INTRAVENOUS

## 2021-02-09 MED ORDER — ONDANSETRON HCL 4 MG/2ML IJ SOLN
INTRAMUSCULAR | Status: DC | PRN
  Administered 2021-02-09: 4 mg via INTRAVENOUS

## 2021-02-09 MED ORDER — LIDOCAINE HCL (CARDIAC) PF 100 MG/5ML IV SOSY
PREFILLED_SYRINGE | INTRAVENOUS | Status: DC | PRN
  Administered 2021-02-09: 80 mg via INTRAVENOUS

## 2021-02-09 SURGICAL SUPPLY — 58 items
ADH SKN CLS APL DERMABOND .7 (GAUZE/BANDAGES/DRESSINGS) ×2
APL PRP STRL LF DISP 70% ISPRP (MISCELLANEOUS) ×2
BLADE SURG SZ11 CARB STEEL (BLADE) ×3 IMPLANT
CANNULA REDUC XI 12-8 STAPL (CANNULA) ×1
CANNULA REDUCER 12-8 DVNC XI (CANNULA) ×2 IMPLANT
CHLORAPREP W/TINT 26 (MISCELLANEOUS) ×3 IMPLANT
COVER TIP SHEARS 8 DVNC (MISCELLANEOUS) ×2 IMPLANT
COVER TIP SHEARS 8MM DA VINCI (MISCELLANEOUS) ×1
DEFOGGER SCOPE WARMER CLEARIFY (MISCELLANEOUS) ×3 IMPLANT
DERMABOND ADVANCED (GAUZE/BANDAGES/DRESSINGS) ×1
DERMABOND ADVANCED .7 DNX12 (GAUZE/BANDAGES/DRESSINGS) ×2 IMPLANT
DRAPE ARM DVNC X/XI (DISPOSABLE) ×6 IMPLANT
DRAPE COLUMN DVNC XI (DISPOSABLE) ×2 IMPLANT
DRAPE DA VINCI XI ARM (DISPOSABLE) ×3
DRAPE DA VINCI XI COLUMN (DISPOSABLE) ×1
ELECT CAUTERY BLADE TIP 2.5 (TIP) ×3
ELECT REM PT RETURN 9FT ADLT (ELECTROSURGICAL) ×3
ELECTRODE CAUTERY BLDE TIP 2.5 (TIP) ×2 IMPLANT
ELECTRODE REM PT RTRN 9FT ADLT (ELECTROSURGICAL) ×2 IMPLANT
GAUZE 4X4 16PLY ~~LOC~~+RFID DBL (SPONGE) ×3 IMPLANT
GLOVE SURG SYN 7.0 (GLOVE) ×9 IMPLANT
GLOVE SURG SYN 7.5  E (GLOVE) ×3
GLOVE SURG SYN 7.5 E (GLOVE) ×6 IMPLANT
GOWN STRL REUS W/ TWL LRG LVL3 (GOWN DISPOSABLE) ×6 IMPLANT
GOWN STRL REUS W/TWL LRG LVL3 (GOWN DISPOSABLE) ×9
GRASPER SUT TROCAR 14GX15 (MISCELLANEOUS) ×3 IMPLANT
IRRIGATION STRYKERFLOW (MISCELLANEOUS) IMPLANT
IRRIGATOR STRYKERFLOW (MISCELLANEOUS)
IV NS 1000ML (IV SOLUTION)
IV NS 1000ML BAXH (IV SOLUTION) IMPLANT
KIT PINK PAD W/HEAD ARE REST (MISCELLANEOUS) ×3
KIT PINK PAD W/HEAD ARM REST (MISCELLANEOUS) ×2 IMPLANT
LABEL OR SOLS (LABEL) ×3 IMPLANT
MANIFOLD NEPTUNE II (INSTRUMENTS) ×3 IMPLANT
MESH VENT LT ST 11.4CM CRL (Mesh General) ×3 IMPLANT
NEEDLE HYPO 22GX1.5 SAFETY (NEEDLE) ×3 IMPLANT
NEEDLE INSUFFLATION 14GA 120MM (NEEDLE) ×3 IMPLANT
OBTURATOR OPTICAL STANDARD 8MM (TROCAR) ×1
OBTURATOR OPTICAL STND 8 DVNC (TROCAR) ×2
OBTURATOR OPTICALSTD 8 DVNC (TROCAR) ×2 IMPLANT
PACK LAP CHOLECYSTECTOMY (MISCELLANEOUS) ×3 IMPLANT
PENCIL ELECTRO HAND CTR (MISCELLANEOUS) ×3 IMPLANT
SEAL CANN UNIV 5-8 DVNC XI (MISCELLANEOUS) ×4 IMPLANT
SEAL XI 5MM-8MM UNIVERSAL (MISCELLANEOUS) ×2
SET TUBE SMOKE EVAC HIGH FLOW (TUBING) ×3 IMPLANT
SOLUTION ELECTROLUBE (MISCELLANEOUS) ×3 IMPLANT
SPONGE T-LAP 18X18 ~~LOC~~+RFID (SPONGE) ×3 IMPLANT
STAPLER CANNULA SEAL DVNC XI (STAPLE) ×2 IMPLANT
STAPLER CANNULA SEAL XI (STAPLE) ×1
SUT MNCRL 4-0 (SUTURE) ×3
SUT MNCRL 4-0 27XMFL (SUTURE) ×2
SUT STRATAFIX PDS 30 CT-1 (SUTURE) ×3 IMPLANT
SUT VICRYL 0 AB UR-6 (SUTURE) ×6 IMPLANT
SUT VLOC 90 2/L VL 12 GS22 (SUTURE) ×6 IMPLANT
SUTURE MNCRL 4-0 27XMF (SUTURE) ×2 IMPLANT
TRAY FOLEY SLVR 16FR LF STAT (SET/KITS/TRAYS/PACK) ×3 IMPLANT
WAND RF SURG SPNG DETECT SYS (INSTRUMENTS) ×3 IMPLANT
WATER STERILE IRR 500ML POUR (IV SOLUTION) IMPLANT

## 2021-02-09 NOTE — Anesthesia Preprocedure Evaluation (Addendum)
Anesthesia Evaluation  Patient identified by MRN, date of birth, ID band Patient awake    Reviewed: Allergy & Precautions, H&P , NPO status , Patient's Chart, lab work & pertinent test results  History of Anesthesia Complications (+) PONV and history of anesthetic complications (Pt reports PONV but has had abdominal surgery recently without an increased level of nausea)  Airway Mallampati: II  TM Distance: >3 FB Neck ROM: Full    Dental no notable dental hx.    Pulmonary neg pulmonary ROS, former smoker,    Pulmonary exam normal breath sounds clear to auscultation       Cardiovascular negative cardio ROS Normal cardiovascular exam Rhythm:Regular Rate:Normal     Neuro/Psych negative neurological ROS  negative psych ROS   GI/Hepatic Neg liver ROS, Ventral hernia and chronic untreated nausea Intermittent, rare, epigastric pain   Endo/Other  negative endocrine ROS  Renal/GU negative Renal ROS  negative genitourinary   Musculoskeletal negative musculoskeletal ROS (+)   Abdominal (+)  Abdomen: soft.  Ventral hernia  Peds negative pediatric ROS (+)  Hematology negative hematology ROS (+)   Anesthesia Other Findings   Reproductive/Obstetrics negative OB ROS                            Anesthesia Physical Anesthesia Plan  ASA: 2  Anesthesia Plan: General   Post-op Pain Management:    Induction: Intravenous  PONV Risk Score and Plan: 4 or greater and Ondansetron, Aprepitant, Dexamethasone and Midazolam  Airway Management Planned: Oral ETT  Additional Equipment:   Intra-op Plan:   Post-operative Plan: Extubation in OR  Informed Consent: I have reviewed the patients History and Physical, chart, labs and discussed the procedure including the risks, benefits and alternatives for the proposed anesthesia with the patient or authorized representative who has indicated his/her  understanding and acceptance.     Dental advisory given  Plan Discussed with: CRNA and Anesthesiologist  Anesthesia Plan Comments:         Anesthesia Quick Evaluation

## 2021-02-09 NOTE — Anesthesia Postprocedure Evaluation (Signed)
Anesthesia Post Note  Patient: Lauren Boyd  Procedure(s) Performed: XI ROBOT ASSISTED UMBILICAL HERNIA REPAIR INSERTION OF MESH  Patient location during evaluation: PACU Anesthesia Type: General Level of consciousness: awake and alert Pain management: pain level controlled Vital Signs Assessment: post-procedure vital signs reviewed and stable Respiratory status: spontaneous breathing, nonlabored ventilation and respiratory function stable Cardiovascular status: blood pressure returned to baseline and stable Postop Assessment: no apparent nausea or vomiting Anesthetic complications: no   No notable events documented.   Last Vitals:  Vitals:   02/09/21 1115 02/09/21 1128  BP: 122/70 118/72  Pulse: 91 90  Resp: 16 16  Temp: 36.9 C 36.7 C  SpO2: 100% 100%    Last Pain:  Vitals:   02/09/21 1128  TempSrc: Temporal  PainSc: [place]

## 2021-02-09 NOTE — Discharge Instructions (Signed)

## 2021-02-09 NOTE — Anesthesia Procedure Notes (Signed)
Procedure Name: Intubation Date/Time: 02/09/2021 7:44 AM Performed by: Chanetta Marshall, CRNA Pre-anesthesia Checklist: Patient identified, Emergency Drugs available, Suction available and Patient being monitored Patient Re-evaluated:Patient Re-evaluated prior to induction Oxygen Delivery Method: Circle system utilized Preoxygenation: Pre-oxygenation with 100% oxygen Induction Type: IV induction Ventilation: Mask ventilation without difficulty Laryngoscope Size: McGraph and 3 Grade View: Grade II Tube type: Oral Tube size: 7.0 mm Number of attempts: 1 Airway Equipment and Method: Stylet and Video-laryngoscopy Placement Confirmation: ETT inserted through vocal cords under direct vision, positive ETCO2, breath sounds checked- equal and bilateral and CO2 detector Secured at: 21 cm Tube secured with: Tape Dental Injury: Teeth and Oropharynx as per pre-operative assessment

## 2021-02-09 NOTE — Transfer of Care (Signed)
Immediate Anesthesia Transfer of Care Note  Patient: Lauren Boyd  Procedure(s) Performed: XI ROBOT ASSISTED UMBILICAL HERNIA REPAIR INSERTION OF MESH  Patient Location: PACU  Anesthesia Type:General  Level of Consciousness: awake, alert  and oriented  Airway & Oxygen Therapy: Patient Spontanous Breathing  Post-op Assessment: Report given to RN and Post -op Vital signs reviewed and stable  Post vital signs: Reviewed and stable  Last Vitals:  Vitals Value Taken Time  BP    Temp    Pulse    Resp    SpO2      Last Pain:  Vitals:   02/09/21 0635  TempSrc: Oral  PainSc: 0-No pain         Complications: No notable events documented.

## 2021-02-09 NOTE — Op Note (Signed)
Procedure Date:  02/09/2021  Pre-operative Diagnosis:  Recurrent umbilical hernia  Post-operative Diagnosis:  Recurrent umbilical hernia  Procedure:  Robotic assisted Recurrent umbilical Hernia Repair with mesh  Surgeon:  Melvyn Neth, MD  Anesthesia:  General endotracheal  Estimated Blood Loss:  5 ml  Specimens:  None  Complications:  None  Findings:  The patient had a prior umbilical hernia repair with mesh, and the mesh had become displaced and bunched up within the recurrent umbilical hernia.  This was excised and the hernia repaired.  Indications for Procedure:  This is a 40 y.o. female who presents with a recurrent umbilical hernia.  The options of surgery versus observation were reviewed with the patient and/or family. The risks of bleeding, abscess or infection, recurrence of symptoms, potential for an open procedure, injury to surrounding structures, and chronic pain were all discussed with the patient and was willing to proceed.  Description of Procedure: The patient was correctly identified in the preoperative area and brought into the operating room.  The patient was placed supine with VTE prophylaxis in place.  Appropriate time-outs were performed.  Anesthesia was induced and the patient was intubated.  Appropriate antibiotics were infused.  The abdomen was prepped and draped in a sterile fashion. The patient's hernia defect was marked with a marking pen.  A Veress needle was introduced in the left upper quadrant and pneumoperitoneum was obtained with appropriate pressures.  Using Optiview technique, an 8 mm port was introduced in the left lateral abdominal wall without complications.  Then, a 12 mm port was introduced in the left upper quadrant and an 8 mm port in the left lower quadrant under direct visualization.  The DaVinci platform was docked, camera targeted, and instruments placed under direct visualization.  The patient's abdominal wall was evaluated and could  clearly see prior mesh which had displaced and become bunched up and protruding through an umbilical hernia.  The peritoneum and preperitoneal fat around the hernia were dissected to allow better exposure of the hernia defect.  This revealed the mesh protruding through the hernia defect.  This was carefully excised with all mesh fully excised.  Once the hernia was fully reduced and exposed, it was measured to be 1.7 cm in size.  A 4.5 inch Bard Ventralight ST Echo mesh, a 0 Stratafix suture, and two 2-0 V-loc sutures were inserted through the 12 mm port under direct visualization.  The hernia defect was closed using the stratafix suture.  A PMI was brought through the center of the hernia defect and the positioning system of the mesh was passed through and insufflated.  This allowed the mesh to splay open and be centered over the repair site with good overlap.  The mesh was then sutured in place circumferentially and through the center of the mesh using the V-loc sutures.  All needles and the positioning system were then removed through the 12 mm port without complications.  The DaVinci platform was then undocked and instruments removed.    50 ml of Exparel solution mixed with 0.5% bupivacaine with epi was infiltrated around the mesh edges, hernia repair site, and port sites.  The 12 mm port was removed and the fascia was closed under direct visualization utilizing an Endo Close technique with 0 Vicryl suture.  The 8 mm ports were removed. The 12 mm incision was closed using 3-0 Vicryl and 4-0 Monocryl, and the other port incisions were closed with 4-0 Monocryl.  The wounds were cleaned and sealed with  DermaBond.  The patient was emerged from anesthesia and extubated and brought to the recovery room for further management.  The patient tolerated the procedure well and all counts were correct at the end of the case.   Melvyn Neth, MD

## 2021-02-09 NOTE — Interval H&P Note (Signed)
History and Physical Interval Note:  02/09/2021 7:15 AM  Lauren Boyd  has presented today for surgery, with the diagnosis of umbilical hernia.  The various methods of treatment have been discussed with the patient and family. After consideration of risks, benefits and other options for treatment, the patient has consented to  Procedure(s) with comments: XI [place] (N/A) - Provider requesting 3 hours / 180 minutes for procedure. as a surgical intervention.  The patient's history has been reviewed, patient examined, no change in status, stable for surgery.  I have reviewed the patient's chart and labs.  Questions were answered to the patient's satisfaction.     [name]

## 2021-02-24 ENCOUNTER — Encounter: Payer: Medicaid Other | Admitting: Physician Assistant

## 2021-02-26 ENCOUNTER — Ambulatory Visit (INDEPENDENT_AMBULATORY_CARE_PROVIDER_SITE_OTHER): Payer: Medicaid Other | Admitting: Surgery

## 2021-02-26 ENCOUNTER — Other Ambulatory Visit: Payer: Self-pay

## 2021-02-26 ENCOUNTER — Encounter: Payer: Self-pay | Admitting: Surgery

## 2021-02-26 VITALS — BP 148/94 | HR 96 | Temp 98.5°F | Ht 64.0 in | Wt 160.4 lb

## 2021-02-26 DIAGNOSIS — K429 Umbilical hernia without obstruction or gangrene: Secondary | ICD-10-CM

## 2021-02-26 DIAGNOSIS — Z09 Encounter for follow-up examination after completed treatment for conditions other than malignant neoplasm: Secondary | ICD-10-CM

## 2021-02-26 NOTE — Progress Notes (Signed)
02/26/2021  HPI: Lauren Boyd is a 40 y.o. female s/p robotic assisted recurrent umbilical hernia repair with prior mesh excision and new mesh placement on 02/09/21.  Patient presents today for follow up.  She reports initial constipation but now reports persistent loose stools.  Denies any significant abdominal pain except at the left upper quadrant incision.  She also reports that she's had some episodes of nausea since surgery which were similar to episodes before surgery.  Vital signs: BP (!) 148/94    Pulse 96    Temp 98.5 F (36.9 C) (Oral)    Ht 5\' 4"  (1.626 m)    Wt 160 lb 6.4 oz (72.8 kg)    SpO2 98%    BMI 27.53 kg/m    Physical Exam: Constitutional:  No acute distress Abdomen:  Soft, non-distended, appropriately sore to palpation.  Incisions are clean, dry, intact.  There is more tenderness in the left upper quadrant incision, likely from the transfascial sutures.  No evidence of infection.  No evidence of hernia recurrence.  Assessment/Plan: This is a 40 y.o. female s/p robotic assisted recurrent umbilical hernia repair.  --Discussed with the patient that the discomfort she's having is related to the sutures and pulling sensation from it as she moves or turns.  This will improve with time as the sutures dissolve and the mesh scars better.  The incisional pain is likely from the transfascial sutures and this should also improve. --Recommended that she try fiber supplementation to help with her loose stools.  She could also try imodium low dose if fiber alone does not help. --With regards to the nausea, she may need further workup with GI about this.  She had been seeing them before for the same.  --Discussed also the possible use of an abdominal binder to help with some of the discomfort around her incisions and when turning or sitting. --Follow up in 1 month.   Melvyn Neth, [facility]

## 2021-02-26 NOTE — Patient Instructions (Addendum)
Please call our office if you have any questions or concerns.  You may try Imodium for your loose stool. If you notice constipation stop taking the imodium. You may want to try an abdominal binder. Dorthula Perfect can be purchased at [facility] or any drug store.    GENERAL POST-OPERATIVE PATIENT INSTRUCTIONS   WOUND CARE INSTRUCTIONS:  Keep a dry clean dressing on the wound if there is drainage. The initial bandage may be removed after 24 hours.  Once the wound has quit draining you may leave it open to air.  If clothing rubs against the wound or causes irritation and the wound is not draining you may cover it with a dry dressing during the daytime.  Try to keep the wound dry and avoid ointments on the wound unless directed to do so.  If the wound becomes bright red and painful or starts to drain infected material that is not clear, please contact your physician immediately.  If the wound is mildly pink and has a thick firm ridge underneath it, this is normal, and is referred to as a healing ridge.  This will resolve over the next 4-6 weeks.  BATHING: You may shower if you have been informed of this by your surgeon. However, Please do not submerge in a tub, hot tub, or pool until incisions are completely sealed or have been told by your surgeon that you may do so.  DIET:  You may eat any foods that you can tolerate.  It is a good idea to eat a high fiber diet and take in plenty of fluids to prevent constipation.  If you do become constipated you may want to take a mild laxative or take ducolax tablets on a daily basis until your bowel habits are regular.  Constipation can be very uncomfortable, along with straining, after recent surgery.  ACTIVITY:  You are encouraged to cough and deep breath or use your incentive spirometer if you were given one, every 15-30 minutes when awake.  This will help prevent respiratory complications and low grade fevers post-operatively if you had a general anesthetic.  You may want  to hug a pillow when coughing and sneezing to add additional support to the surgical area, if you had abdominal or chest surgery, which will decrease pain during these times.  You are encouraged to walk and engage in light activity for the next two weeks.  You should not lift more than 20 pounds, until 03/23/2020 as it could put you at increased risk for complications.  Twenty pounds is roughly equivalent to a plastic bag of groceries. At that time- Listen to your body when lifting, if you have pain when lifting, stop and then try again in a few days. Soreness after doing exercises or activities of daily living is normal as you get back in to your normal routine.  MEDICATIONS:  Try to take narcotic medications and anti-inflammatory medications, such as tylenol, ibuprofen, naprosyn, etc., with food.  This will minimize stomach upset from the medication.  Should you develop nausea and vomiting from the pain medication, or develop a rash, please discontinue the medication and contact your physician.  You should not drive, make important decisions, or operate machinery when taking narcotic pain medication.  SUNBLOCK Use sun block to incision area over the next year if this area will be exposed to sun. This helps decrease scarring and will allow you avoid a permanent darkened area over your incision.  QUESTIONS:  Please feel free to call our office  if you have any questions, and we will be glad to assist you. [phone]  Fiber Content in Foods Fiber is a substance that is found in plant foods, such as fruits, vegetables, whole grains, nuts, seeds, and beans. As part of your treatment and recovery plan, your health care provider may recommend that you eat foods that have specific amounts of dietary fiber. Some conditions may require a high-fiber diet while others may require a low-fiber diet. This sheet gives you information about the dietary fiber content of some common foods. Your health care provider will  tell you how much fiber you need in your diet. If you have problems or questions, contact your health care provider or dietitian. What foods are high in fiber? Fruits Blackberries or raspberries (fresh) --  cup (75 g) has 4 g of fiber. Pear (fresh) -- 1 medium (180 g) has 5.5 g of fiber. Prunes (dried) -- 6 to 8 pieces (57-76 g) has 5 g of fiber. Apple with skin -- 1 medium (182 g) has 4.8 g of fiber. Guava -- 1 cup (128 g) has 8.9 g of fiber. Vegetables Peas (frozen) --  cup (80 g) has 4.4 g of fiber. Potato with skin (baked) -- 1 medium (173 g) has 4.4 g of fiber. Pumpkin (canned) --  cup (122 g) has 5 g of fiber. Brussels sprouts (cooked) --  cup (78 g) has 4 g of fiber. Sweet potato --  cup mashed (124 g) has 4 g of fiber. Winter squash -- 1 cup cooked (205 g) has 5.7 g of fiber. Grains Bran cereal --  cup (31 g) has 8.6 g of fiber. Bulgur (cooked) --  cup (70 g) has 4 g of fiber. Quinoa (cooked) -- 1 cup (185 g) has 5.2 g of fiber. Popcorn -- 3 cups (375 g) popped has 5.8 g of fiber. Spaghetti, whole wheat -- 1 cup (140 g) has 6 g of fiber. Meats and other proteins Pinto beans (cooked) --  cup (90 g) has 7.7 g of fiber. Lentils (cooked) --  cup (90 g) has 7.8 g of fiber. Kidney beans (canned) --  cup (92.5 g) has 5.7 g of fiber. Soybeans (canned, frozen, or fresh) --  cup (92.5 g) has 5.2 g of fiber. Baked beans, plain or vegetarian (canned) --  cup (130 g) has 5.2 g of fiber. Garbanzo beans or chickpeas (canned) --  cup (90 g) has 6.6 g of fiber. Black beans (cooked) --  cup (86 g) has 7.5 g of fiber. White beans or navy beans (cooked) --  cup (91 g) has 9.3 g of fiber. The items listed above may not be a complete list of foods with high fiber. Actual amounts of fiber may be different depending on processing. Contact a dietitian for more information. What foods are moderate in fiber? Fruits Banana -- 1 medium (126 g) has 3.2 g of fiber. Melon -- 1 cup (155 g)  has 1.4 g of fiber. Orange -- 1 small (154 g) has 3.7 g of fiber. Raisins --  cup (40 g) has 1.8 g of fiber. Applesauce, sweetened --  cup (125 g) has 1.5 g of fiber. Blueberries (fresh) --  cup (75 g) has 1.8 g of fiber. Strawberries (fresh, sliced) -- 1 cup (865 g) has 3 g of fiber. Cherries -- 1 cup (140 g) has 2.9 g of fiber. Vegetables Broccoli (cooked) --  cup (77.5 g) has 2.1 g of fiber. Carrots (cooked) --  cup (77.5 g)  has 2.2 g of fiber. Corn (canned or frozen) --  cup (82.5 g) has 2.1 g of fiber. Potatoes, mashed --  cup (105 g) has 1.6 g of fiber. Tomato -- 1 medium (62 g) has 1.5 g of fiber. Green beans (canned) --  cup (83 g) has 2 g of fiber. Squash, winter --  cup (58 g) has 1 g of fiber. Sweet potato, baked -- 1 medium (150 g) has 3 g of fiber. Cauliflower (cooked) -- 1/2 cup (90 g) has 2.3 g of fiber. Grains Long-grain brown rice (cooked) -- 1 cup (196 g) has 3.5 g of fiber. Bagel, plain -- one 4-inch (10 cm) bagel has 2 g of fiber. Instant oatmeal --  cup (120 g) has about 2 g of fiber. Macaroni noodles, enriched (cooked) -- 1 cup (140 g) has 2.5 g of fiber. Multigrain cereal --  cup (15 g) has about 2-4 g of fiber. Whole-wheat bread -- 1 slice (26 g) has 2 g of fiber. Whole-wheat spaghetti noodles --  cup (70 g) has 3.2 g of fiber. Corn tortilla -- one 6-inch (15 cm) tortilla has 1.5 g of fiber. Meats and other proteins Almonds --  cup or 1 oz (28 g) has 3.5 g of fiber. Sunflower seeds in shell --  cup or  oz (11.5 g) has 1.1 g of fiber. Vegetable or soy patty -- 1 patty (70 g) has 3.4 g of fiber. Walnuts --  cup or 1 oz (30 g) has 2 g of fiber. Flax seed -- 1 Tbsp (7 g) has 2.8 g of fiber. The items listed above may not be a complete list of foods that have moderate amounts of fiber. Actual amounts of fiber may be different depending on processing. Contact a dietitian for more information. What foods are low in fiber? Low-fiber foods contain less  than 1 g of fiber per serving. They include: Fruits Fruit juice --  cup or 4 fl oz (118 mL) has 0.5 g of fiber. Vegetables Lettuce -- 1 cup (35 g) has 0.5 g of fiber. Cucumber (slices) --  cup (60 g) has 0.3 g of fiber. Celery -- 1 stalk (40 g) has 0.1 g of fiber. Grains Flour tortilla -- one 6-inch (15 cm) tortilla has 0.5 g of fiber. White rice (cooked) --  cup (81.5 g) has 0.3 g of fiber. Meats and other proteins Egg -- 1 large (50 g) has 0 g of fiber. Meat, poultry, or fish -- 3 oz (85 g) has 0 g of fiber. Dairy Milk -- 1 cup or 8 fl oz (237 mL) has 0 g of fiber. Yogurt -- 1 cup (245 g) has 0 g of fiber. The items listed above may not be a complete list of foods that are low in fiber. Actual amounts of fiber may be different depending on processing. Contact a dietitian for more information. Summary Fiber is a substance that is found in plant foods, such as fruits, vegetables, whole grains, nuts, seeds, and beans. As part of your treatment and recovery plan, your health care provider may recommend that you eat foods that have specific amounts of dietary fiber. This information is not intended to replace advice given to you by your health care provider. Make sure you discuss any questions you have with your health care provider. Document Revised: 07/04/2019 Document Reviewed: 07/04/2019 Elsevier Patient Education  [place]. High-Fiber Eating Plan Fiber, also called dietary fiber, is a type of carbohydrate. It is found foods  such as fruits, vegetables, whole grains, and beans. A high-fiber diet can have many health benefits. Your health care provider may recommend a high-fiber diet to help: Prevent constipation. Fiber can make your bowel movements more regular. Lower your cholesterol. Relieve the following conditions: Inflammation of veins in the anus (hemorrhoids). Inflammation of specific areas of the digestive tract (uncomplicated diverticulosis). A problem of the large  intestine, also called the colon, that sometimes causes pain and diarrhea (irritable bowel syndrome, or IBS). Prevent overeating as part of a weight-loss plan. Prevent heart disease, type 2 diabetes, and certain cancers. What are tips for following this plan? Reading food labels  Check the nutrition facts label on food products for the amount of dietary fiber. Choose foods that have 5 grams of fiber or more per serving. The goals for recommended daily fiber intake include: Men (age 75 or younger): 34-38 g. Men (over age 75): 28-34 g. Women (age 76 or younger): 25-28 g. Women (over age 59): 22-25 g. Your daily fiber goal is _____________ g. Shopping Choose whole fruits and vegetables instead of processed forms, such as apple juice or applesauce. Choose a wide variety of high-fiber foods such as avocados, lentils, oats, and kidney beans. Read the nutrition facts label of the foods you choose. Be aware of foods with added fiber. These foods often have high sugar and sodium amounts per serving. Cooking Use whole-grain flour for baking and cooking. Cook with brown rice instead of white rice. Meal planning Start the day with a breakfast that is high in fiber, such as a cereal that contains 5 g of fiber or more per serving. Eat breads and cereals that are made with whole-grain flour instead of refined flour or white flour. Eat brown rice, bulgur wheat, or millet instead of white rice. Use beans in place of meat in soups, salads, and pasta dishes. Be sure that half of the grains you eat each day are whole grains. General information You can get the recommended daily intake of dietary fiber by: Eating a variety of fruits, vegetables, grains, nuts, and beans. Taking a fiber supplement if you are not able to take in enough fiber in your diet. It is better to get fiber through food than from a supplement. Gradually increase how much fiber you consume. If you increase your intake of dietary fiber  too quickly, you may have bloating, cramping, or gas. Drink plenty of water to help you digest fiber. Choose high-fiber snacks, such as berries, raw vegetables, nuts, and popcorn. What foods should I eat? Fruits Berries. Pears. Apples. Oranges. Avocado. Prunes and raisins. Dried figs. Vegetables Sweet potatoes. Spinach. Kale. Artichokes. Cabbage. Broccoli. Cauliflower. Green peas. Carrots. Squash. Grains Whole-grain breads. Multigrain cereal. Oats and oatmeal. Brown rice. Barley. Bulgur wheat. [place]. Quinoa. Bran muffins. Popcorn. Rye wafer crackers. Meats and other proteins Navy beans, kidney beans, and pinto beans. Soybeans. Split peas. Lentils. Nuts and seeds. Dairy Fiber-fortified yogurt. Beverages Fiber-fortified soy milk. Fiber-fortified orange juice. Other foods Fiber bars. The items listed above may not be a complete list of recommended foods and beverages. Contact a dietitian for more information. What foods should I avoid? Fruits Fruit juice. Cooked, strained fruit. Vegetables Fried potatoes. Canned vegetables. Well-cooked vegetables. Grains White bread. Pasta made with refined flour. White rice. Meats and other proteins Fatty cuts of meat. Fried chicken or fried fish. Dairy Milk. Yogurt. Cream cheese. Sour cream. Fats and oils Butters. Beverages Soft drinks. Other foods Cakes and pastries. The items listed above may not  be a complete list of foods and beverages to avoid. Talk with your dietitian about what choices are best for you. Summary Fiber is a type of carbohydrate. It is found in foods such as fruits, vegetables, whole grains, and beans. A high-fiber diet has many benefits. It can help to prevent constipation, lower blood cholesterol, aid weight loss, and reduce your risk of heart disease, diabetes, and certain cancers. Increase your intake of fiber gradually. Increasing fiber too quickly may cause cramping, bloating, and gas. Drink plenty of water while you  increase the amount of fiber you consume. The best sources of fiber include whole fruits and vegetables, whole grains, nuts, seeds, and beans. This information is not intended to replace advice given to you by your health care provider. Make sure you discuss any questions you have with your health care provider. Document Revised: 07/04/2019 Document Reviewed: 07/04/2019 Elsevier Patient Education  2022 [employer].

## 2021-03-19 ENCOUNTER — Encounter: Payer: Self-pay | Admitting: Family Medicine

## 2021-03-31 ENCOUNTER — Encounter: Payer: Medicaid Other | Admitting: Surgery

## 2021-04-05 ENCOUNTER — Encounter: Payer: Self-pay | Admitting: Family Medicine

## 2021-04-05 DIAGNOSIS — R0789 Other chest pain: Secondary | ICD-10-CM | POA: Diagnosis not present

## 2021-04-05 DIAGNOSIS — D1803 Hemangioma of intra-abdominal structures: Secondary | ICD-10-CM | POA: Diagnosis not present

## 2021-04-05 DIAGNOSIS — R1013 Epigastric pain: Secondary | ICD-10-CM | POA: Diagnosis not present

## 2021-04-05 DIAGNOSIS — R110 Nausea: Secondary | ICD-10-CM | POA: Diagnosis not present

## 2021-05-13 ENCOUNTER — Other Ambulatory Visit: Payer: Self-pay

## 2021-05-13 ENCOUNTER — Encounter: Payer: Self-pay | Admitting: Family Medicine

## 2021-05-13 ENCOUNTER — Ambulatory Visit (INDEPENDENT_AMBULATORY_CARE_PROVIDER_SITE_OTHER): Payer: Medicaid Other | Admitting: Family Medicine

## 2021-05-13 VITALS — BP 130/80 | HR 72 | Ht 64.0 in | Wt 163.0 lb

## 2021-05-13 DIAGNOSIS — R519 Headache, unspecified: Secondary | ICD-10-CM

## 2021-05-13 DIAGNOSIS — R569 Unspecified convulsions: Secondary | ICD-10-CM | POA: Diagnosis not present

## 2021-05-13 NOTE — Progress Notes (Signed)
? ? ?Date:  05/13/2021  ? ?Name:  Lauren Boyd   DOB:  1981-01-09   MRN:  [id number] ? ? ?Chief Complaint: Headache (Waking up with headaches in the am- lasts majority of the day. Ibuprofen not really helping anymore. Moving in sleep/ jumping and a "roaring" witnessed by spouse in her sleep.) ? ?Headache  ?This is a new problem. The current episode started more than 1 month ago (past 6 months). The pain is located in the Frontal and retro-orbital region. The pain does not radiate. The quality of the pain is described as throbbing. The pain is at a severity of 8/10. The pain is moderate. Pertinent negatives include no abdominal pain, back pain, blurred vision, coughing, dizziness, ear pain, facial sweating, fever, insomnia, loss of balance, nausea, neck pain, scalp tenderness or sore throat. Associated symptoms comments: Make a roaring noise/  ? Seizure/with grinding of teeth. She has tried nothing for the symptoms. The treatment provided mild relief. There is no history of cluster headaches, hypertension or recent head traumas.  ? ?Lab Results  ?Component Value Date  ? NA 140 12/02/2018  ? K 4.0 12/02/2018  ? CO2 24 12/02/2018  ? GLUCOSE 101 (H) 12/02/2018  ? BUN 25 (H) 12/02/2018  ? CREATININE 0.83 12/02/2018  ? CALCIUM 9.3 12/02/2018  ? GFRNONAA >60 12/02/2018  ? ?Lab Results  ?Component Value Date  ? CHOL 142 11/30/2018  ? HDL 72 11/30/2018  ? LDLCALC 61 11/30/2018  ? TRIG 38 11/30/2018  ? CHOLHDL 2.0 11/30/2018  ? ?Lab Results  ?Component Value Date  ? TSH 1.144 01/07/2018  ? ?No results found for: HGBA1C ?Lab Results  ?Component Value Date  ? WBC 6.8 11/21/2019  ? HGB 12.4 11/21/2019  ? HCT 37.4 11/21/2019  ? MCV 88.8 11/21/2019  ? PLT 271 11/21/2019  ? ?Lab Results  ?Component Value Date  ? ALT 16 12/02/2018  ? AST 17 12/02/2018  ? ALKPHOS 52 12/02/2018  ? BILITOT 0.5 12/02/2018  ? ?No results found for: 25OHVITD2, 25OHVITD3, VD25OH  ? ?Review of Systems  ?Constitutional:  Negative for chills and fever.   ?HENT:  Negative for drooling, ear discharge, ear pain and sore throat.   ?Eyes:  Negative for blurred vision.  ?Respiratory:  Negative for cough, shortness of breath and wheezing.   ?Cardiovascular:  Negative for chest pain, palpitations and leg swelling.  ?Gastrointestinal:  Negative for abdominal pain, blood in stool, constipation, diarrhea and nausea.  ?Endocrine: Negative for polydipsia.  ?Genitourinary:  Negative for dysuria, frequency, hematuria and urgency.  ?Musculoskeletal:  Negative for back pain, myalgias and neck pain.  ?Skin:  Negative for rash.  ?Allergic/Immunologic: Negative for environmental allergies.  ?Neurological:  Positive for headaches. Negative for dizziness and loss of balance.  ?Hematological:  Does not bruise/bleed easily.  ?Psychiatric/Behavioral:  Negative for suicidal ideas. The patient is not nervous/anxious and does not have insomnia.   ? ?Patient Active Problem List  ? Diagnosis Date Noted  ? Recurrent umbilical hernia   ? Low ferritin 08/02/2019  ? B12 deficiency 08/02/2019  ? Difficulty walking 07/18/2019  ? Numbness and tingling of left side of face 07/18/2019  ? Calculus of gallbladder without cholecystitis without obstruction 08/02/2016  ? ? ?Allergies  ?Allergen Reactions  ? Vyvanse [Lisdexamfetamine]   ?  Chewing lips and crazy thoughts  ? ? ?Past Surgical History:  ?Procedure Laterality Date  ? CHOLECYSTECTOMY  2018  ? HERNIA REPAIR  2014  ? Umbical  ? INSERTION OF MESH  02/09/2021  ? Procedure: INSERTION OF MESH;  Surgeon: Henrene Dodge, MD;  Location: [facility] ORS;  Service: General;;  ? TUBAL LIGATION    ? ? ?Social History  ? ?Tobacco Use  ? Smoking status: Former  ?  Types: Cigarettes  ? Smokeless tobacco: Never  ?Vaping Use  ? Vaping Use: Never used  ?Substance Use Topics  ? Alcohol use: Yes  ?  Comment: occasional  ? Drug use: Not Currently  ? ? ? ?Medication list has been reviewed and updated. ? ?Current Meds  ?Medication Sig  ? acetaminophen (TYLENOL) 500 MG tablet  Take 2 tablets (1,000 mg total) by mouth every 6 (six) hours as needed for mild pain.  ? cyanocobalamin 1000 MCG tablet Take 1,000 mcg by mouth daily.  ? ferrous sulfate 324 MG TBEC Take 324 mg by mouth daily with breakfast.  ? ibuprofen (ADVIL) 800 MG tablet Take 1 tablet (800 mg total) by mouth every 8 (eight) hours as needed for moderate pain.  ? Multiple Vitamin (MULTIVITAMIN) tablet Take 1 tablet by mouth daily.  ? VITAMIN D PO Take 1 capsule by mouth 3 (three) times a week.  ? ? ?PHQ 2/9 Scores 05/13/2021 04/02/2020 11/12/2019 07/12/2019  ?PHQ - 2 Score 0 0 0 0  ?PHQ- 9 Score 3 0 0 0  ? ? ?GAD 7 : Generalized Anxiety Score 05/13/2021 04/02/2020 11/12/2019 07/12/2019  ?Nervous, Anxious, on Edge 0 0 0 0  ?Control/stop worrying 0 0 0 0  ?Worry too much - different things 0 0 0 0  ?Trouble relaxing 0 0 0 0  ?Restless 0 0 0 0  ?Easily annoyed or irritable 0 0 0 0  ?Afraid - awful might happen 0 0 0 0  ?Total GAD 7 Score 0 0 0 0  ?Anxiety Difficulty Not difficult at all - - -  ? ? ?BP Readings from Last 3 Encounters:  ?05/13/21 130/80  ?02/26/21 (!) 148/94  ?02/09/21 118/72  ? ? ?Physical Exam ?Vitals and nursing note reviewed.  ?Constitutional:   ?   Appearance: She is well-developed.  ?HENT:  ?   Head: Normocephalic.  ?   Right Ear: External ear normal.  ?   Left Ear: External ear normal.  ?Eyes:  ?   General: Lids are everted, no foreign bodies appreciated. No scleral icterus.    ?   Left eye: No foreign body or hordeolum.  ?   Conjunctiva/sclera: Conjunctivae normal.  ?   Right eye: Right conjunctiva is not injected.  ?   Left eye: Left conjunctiva is not injected.  ?   Pupils: Pupils are equal, round, and reactive to light.  ?Neck:  ?   Thyroid: No thyromegaly.  ?   Vascular: No JVD.  ?   Trachea: No tracheal deviation.  ?Cardiovascular:  ?   Rate and Rhythm: Normal rate and regular rhythm.  ?   Heart sounds: Normal heart sounds. No murmur heard. ?  No friction rub. No gallop.  ?Pulmonary:  ?   Effort: Pulmonary effort  is normal. No respiratory distress.  ?   Breath sounds: Normal breath sounds. No wheezing, rhonchi or rales.  ?Abdominal:  ?   General: Bowel sounds are normal.  ?   Palpations: Abdomen is soft. There is no mass.  ?   Tenderness: There is no abdominal tenderness. There is no guarding or rebound.  ?Musculoskeletal:     ?   General: No tenderness. Normal range of motion.  ?   Cervical  back: Normal range of motion and neck supple.  ?Lymphadenopathy:  ?   Cervical: No cervical adenopathy.  ?Skin: ?   General: Skin is warm.  ?   Findings: No rash.  ?Neurological:  ?   Mental Status: She is alert and oriented to person, place, and time.  ?   Cranial Nerves: No cranial nerve deficit.  ?   Deep Tendon Reflexes: Reflexes normal.  ?Psychiatric:     ?   Mood and Affect: Mood is not anxious or depressed.  ? ? ?Wt Readings from Last 3 Encounters:  ?05/13/21 163 lb (73.9 kg)  ?02/26/21 160 lb 6.4 oz (72.8 kg)  ?02/09/21 162 lb 14.7 oz (73.9 kg)  ? ? ?BP 130/80   Pulse 72   Ht 5\' 4"  (1.626 m)   Wt 163 lb (73.9 kg)   LMP 04/20/2021 (Approximate)   BMI 27.98 kg/m?  ? ?Assessment and Plan: ? ?1. Headache disorder ?New onset.  Persistent.  Episodic in intensity.  Patient is having migraine-like headaches that are across the forehead behind the eyes.  These are relatively new and previously she has been evaluated by neurology and we will refer to neurology for evaluation thereof. ?- Ambulatory referral to Neurology ? ?2. Witnessed seizure-like activity (HCC) ?Patient has also had her partner note that she has episodes that she is weak and out of sleep with the jerking activity?  Tonic-clonic which is preceded by a roaring noise.  I do not know if this is seizure-like activity or of this nature but given that she does have some restless leg possibilities we will refer to neurology as part of the new onset headache and recent observance of sleep-related activity. ?- Ambulatory referral to Neurology  ? ? ?

## 2021-06-02 DIAGNOSIS — D1803 Hemangioma of intra-abdominal structures: Secondary | ICD-10-CM | POA: Diagnosis not present

## 2021-06-02 DIAGNOSIS — R1013 Epigastric pain: Secondary | ICD-10-CM | POA: Diagnosis not present

## 2021-06-02 DIAGNOSIS — Z1159 Encounter for screening for other viral diseases: Secondary | ICD-10-CM | POA: Diagnosis not present

## 2021-07-05 ENCOUNTER — Encounter: Admission: RE | Payer: Self-pay | Source: Home / Self Care

## 2021-07-05 ENCOUNTER — Ambulatory Visit: Admission: RE | Admit: 2021-07-05 | Payer: Medicaid Other | Source: Home / Self Care

## 2021-07-05 SURGERY — COLONOSCOPY WITH PROPOFOL
Anesthesia: General

## 2021-07-21 ENCOUNTER — Institutional Professional Consult (permissible substitution): Payer: Medicaid Other | Admitting: Neurology

## 2021-09-21 ENCOUNTER — Ambulatory Visit: Payer: Medicaid Other | Admitting: Gastroenterology

## 2021-09-24 ENCOUNTER — Ambulatory Visit: Admit: 2021-09-24 | Payer: Medicaid Other

## 2021-09-24 SURGERY — COLONOSCOPY WITH PROPOFOL
Anesthesia: General

## 2021-12-28 ENCOUNTER — Telehealth: Payer: Self-pay

## 2021-12-28 NOTE — Telephone Encounter (Signed)
Called pt left VM to call back. Pt scheduled an appt via Mychart with description for the visit as " will dsicuss at appt". We will need to know why the patient is coming in to make sure we can see her for the problem or if she needs to be seen at [facility] or the ED.  KP

## 2021-12-28 NOTE — Telephone Encounter (Signed)
Patient called back and stated she wanted to speak to Dr. Ronnald Ramp about some anxiety and depression she has been experiencing. Updated appointment notes.

## 2021-12-28 NOTE — Telephone Encounter (Signed)
Noted  KP 

## 2022-01-03 ENCOUNTER — Encounter: Payer: Self-pay | Admitting: Family Medicine

## 2022-01-03 ENCOUNTER — Ambulatory Visit (INDEPENDENT_AMBULATORY_CARE_PROVIDER_SITE_OTHER): Payer: Medicaid Other | Admitting: Family Medicine

## 2022-01-03 VITALS — BP 128/78 | HR 86 | Ht 64.0 in | Wt 167.0 lb

## 2022-01-03 DIAGNOSIS — F4323 Adjustment disorder with mixed anxiety and depressed mood: Secondary | ICD-10-CM

## 2022-01-03 DIAGNOSIS — F4310 Post-traumatic stress disorder, unspecified: Secondary | ICD-10-CM

## 2022-01-03 MED ORDER — ESCITALOPRAM OXALATE 10 MG PO TABS: 10.0000 mg | ORAL_TABLET | Freq: Every day | ORAL | 0 refills | Status: DC

## 2022-01-03 MED ORDER — ALPRAZOLAM 0.25 MG PO TABS: 0.2500 mg | ORAL_TABLET | Freq: Two times a day (BID) | ORAL | 0 refills | Status: DC | PRN

## 2022-01-03 NOTE — Progress Notes (Signed)
Date:  01/03/2022   Name:  Lauren Boyd   DOB:  09/24/1980   MRN:  [id number]   Chief Complaint: Anxiety (A lot going on right now/ 14 and 9)  depression  Anxiety Presents for initial visit. Onset was 1 to 6 months ago (3 month). The problem has been gradually worsening. Symptoms include chest pain, decreased concentration, depressed mood, excessive worry, insomnia, irritability, malaise, nausea, nervous/anxious behavior, panic and restlessness. Patient reports no dizziness, feeling of choking, palpitations, shortness of breath or suicidal ideas. Symptoms occur constantly. The severity of symptoms is interfering with daily activities and causing significant distress. The quality of sleep is poor. Nighttime awakenings: one to two, several.   Risk factors include a major life event (death of child).  Depression        This is a chronic problem.  The current episode started more than 1 month ago.   The problem occurs daily.  The problem has been gradually worsening since onset.  Associated symptoms include decreased concentration, helplessness, hopelessness, insomnia, irritable, restlessness, decreased interest and sad.  Associated symptoms include no myalgias, no headaches and no suicidal ideas.  Past medical history includes anxiety.     Lab Results  Component Value Date   NA 140 12/02/2018   K 4.0 12/02/2018   CO2 24 12/02/2018   GLUCOSE 101 (H) 12/02/2018   BUN 25 (H) 12/02/2018   CREATININE 0.83 12/02/2018   CALCIUM 9.3 12/02/2018   GFRNONAA >60 12/02/2018   Lab Results  Component Value Date   CHOL 142 11/30/2018   HDL 72 11/30/2018   LDLCALC 61 11/30/2018   TRIG 38 11/30/2018   CHOLHDL 2.0 11/30/2018   Lab Results  Component Value Date   TSH 1.144 01/07/2018   No results found for: "HGBA1C" Lab Results  Component Value Date   WBC 6.8 11/21/2019   HGB 12.4 11/21/2019   HCT 37.4 11/21/2019   MCV 88.8 11/21/2019   PLT 271 11/21/2019   Lab Results  Component  Value Date   ALT 16 12/02/2018   AST 17 12/02/2018   ALKPHOS 52 12/02/2018   BILITOT 0.5 12/02/2018   No results found for: "25OHVITD2", "25OHVITD3", "VD25OH"   Review of Systems  Constitutional:  Positive for irritability. Negative for chills and fever.  HENT:  Negative for drooling, ear discharge, ear pain and sore throat.   Respiratory:  Negative for cough, shortness of breath and wheezing.   Cardiovascular:  Positive for chest pain. Negative for palpitations and leg swelling.  Gastrointestinal:  Positive for nausea. Negative for abdominal pain, blood in stool, constipation and diarrhea.  Endocrine: Negative for polydipsia.  Genitourinary:  Negative for dysuria, frequency, hematuria and urgency.  Musculoskeletal:  Negative for back pain, myalgias and neck pain.  Skin:  Negative for rash.  Allergic/Immunologic: Negative for environmental allergies.  Neurological:  Negative for dizziness and headaches.  Hematological:  Does not bruise/bleed easily.  Psychiatric/Behavioral:  Positive for decreased concentration and depression. Negative for suicidal ideas. The patient is nervous/anxious and has insomnia.     Patient Active Problem List   Diagnosis Date Noted   Recurrent umbilical hernia    Low ferritin 08/02/2019   B12 deficiency 08/02/2019   Difficulty walking 07/18/2019   Numbness and tingling of left side of face 07/18/2019   Calculus of gallbladder without cholecystitis without obstruction 08/02/2016    Allergies  Allergen Reactions   Sertraline     Talking to self and "doing things I didn't realize I  was doing"   Vyvanse [Lisdexamfetamine]     Chewing lips and crazy thoughts    Past Surgical History:  Procedure Laterality Date   CHOLECYSTECTOMY  2018   HERNIA REPAIR  2014   Umbical   INSERTION OF MESH  02/09/2021   Procedure: INSERTION OF MESH;  Surgeon: Henrene Dodge, MD;  Location: [facility] ORS;  Service: General;;   TUBAL LIGATION      Social History   Tobacco  Use   Smoking status: Former    Types: Cigarettes   Smokeless tobacco: Never  Vaping Use   Vaping Use: Never used  Substance Use Topics   Alcohol use: Yes    Comment: occasional   Drug use: Not Currently     Medication list has been reviewed and updated.  Current Meds  Medication Sig   acetaminophen (TYLENOL) 500 MG tablet Take 2 tablets (1,000 mg total) by mouth every 6 (six) hours as needed for mild pain.   cyanocobalamin 1000 MCG tablet Take 1,000 mcg by mouth daily.   ferrous sulfate 324 MG TBEC Take 324 mg by mouth daily with breakfast.   ibuprofen (ADVIL) 800 MG tablet Take 1 tablet (800 mg total) by mouth every 8 (eight) hours as needed for moderate pain.   Multiple Vitamin (MULTIVITAMIN) tablet Take 1 tablet by mouth daily.   VITAMIN D PO Take 1 capsule by mouth 3 (three) times a week.       01/03/2022    1:53 PM 05/13/2021    2:05 PM 04/02/2020   10:47 AM 11/12/2019    1:27 PM  GAD 7 : Generalized Anxiety Score  Nervous, Anxious, on Edge 1 0 0 0  Control/stop worrying 3 0 0 0  Worry too much - different things 3 0 0 0  Trouble relaxing 2 0 0 0  Restless 0 0 0 0  Easily annoyed or irritable 0 0 0 0  Afraid - awful might happen 0 0 0 0  Total GAD 7 Score 9 0 0 0  Anxiety Difficulty Not difficult at all Not difficult at all         01/03/2022    1:52 PM 05/13/2021    2:05 PM 04/02/2020   10:47 AM  Depression screen PHQ 2/9  Decreased Interest 3 0 0  Down, Depressed, Hopeless 2 0 0  PHQ - 2 Score 5 0 0  Altered sleeping 3 3 0  Tired, decreased energy 1 0 0  Change in appetite 2 0 0  Feeling bad or failure about yourself  1 0 0  Trouble concentrating 2 0 0  Moving slowly or fidgety/restless 0 0 0  Suicidal thoughts 0 0 0  PHQ-9 Score 14 3 0  Difficult doing work/chores Very difficult Not difficult at all     BP Readings from Last 3 Encounters:  01/03/22 128/78  05/13/21 130/80  02/26/21 (!) 148/94    Physical Exam Constitutional:      General: She  is irritable.  HENT:     Head: Normocephalic.     Right Ear: Tympanic membrane normal.     Left Ear: Tympanic membrane normal.     Mouth/Throat:     Mouth: Mucous membranes are moist.  Cardiovascular:     Rate and Rhythm: Normal rate.     Heart sounds: No murmur heard.    No friction rub. No gallop.  Pulmonary:     Effort: Pulmonary effort is normal.     Breath sounds: No wheezing, rhonchi  or rales.  Neurological:     Mental Status: She is alert.  Psychiatric:     Comments: nonsuicidal     Wt Readings from Last 3 Encounters:  01/03/22 167 lb (75.8 kg)  05/13/21 163 lb (73.9 kg)  02/26/21 160 lb 6.4 oz (72.8 kg)    BP 128/78   Pulse 86   Ht 5\' 4"  (1.626 m)   Wt 167 lb (75.8 kg)   LMP 01/03/2022 (Exact Date)   SpO2 97%   BMI 28.67 kg/m   Assessment and Plan:  1. 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  Waxes and wanes.  Presently unstable but nonsuicidal.  PHQ is 14 with a gad score of 9.  This has to do with setting moving out of her kids and having somewhat of a "empty nest ".  We will treat with escitalopram 10 mg 1/2 tablet for the first 2 weeks then a whole tablet.  We will recheck in 4 weeks.  Patient is having some panic issues and patient has been given alprazolam 0.51 twice a day as needed.  We will refer to psychiatry for further eval. - escitalopram (LEXAPRO) 10 MG tablet; Take 1 tablet (10 mg total) by mouth daily. Star with one half a day first 2 weeks  Dispense: 30 tablet; Refill: 0 - ALPRAZolam (XANAX) 0.25 MG tablet; Take 1 tablet (0.25 mg total) by mouth 2 (two) times daily as needed for anxiety.  Dispense: 20 tablet; Refill: 0 - Ambulatory referral to Psychiatry  2. Post traumatic stress disorder Patient is also posttraumatic stress given that this is coming up on the anniversary of the death of her child.  This is more of a posttraumatic stress anniversary and we will treat with initiating escitalopram 5 mg to taper on to 10 mg. -  escitalopram (LEXAPRO) 10 MG tablet; Take 1 tablet (10 mg total) by mouth daily. Star with one half a day first 2 weeks  Dispense: 30 tablet; Refill: 0 - ALPRAZolam (XANAX) 0.25 MG tablet; Take 1 tablet (0.25 mg total) by mouth 2 (two) times daily as needed for anxiety.  Dispense: 20 tablet; Refill: 0 - Ambulatory referral to Psychiatry    Elizabeth Sauer, MD

## 2022-02-09 ENCOUNTER — Other Ambulatory Visit: Payer: Self-pay | Admitting: Family Medicine

## 2022-02-09 ENCOUNTER — Other Ambulatory Visit: Payer: Self-pay

## 2022-02-09 ENCOUNTER — Encounter: Payer: Self-pay | Admitting: Family Medicine

## 2022-02-09 DIAGNOSIS — F4323 Adjustment disorder with mixed anxiety and depressed mood: Secondary | ICD-10-CM

## 2022-02-09 DIAGNOSIS — F4310 Post-traumatic stress disorder, unspecified: Secondary | ICD-10-CM

## 2022-02-09 MED ORDER — ESCITALOPRAM OXALATE 10 MG PO TABS: 10.0000 mg | ORAL_TABLET | Freq: Every day | ORAL | 0 refills | Status: DC

## 2022-02-21 DIAGNOSIS — F411 Generalized anxiety disorder: Secondary | ICD-10-CM | POA: Diagnosis not present

## 2022-02-21 DIAGNOSIS — F4312 Post-traumatic stress disorder, chronic: Secondary | ICD-10-CM | POA: Diagnosis not present

## 2022-02-22 DIAGNOSIS — Z79899 Other long term (current) drug therapy: Secondary | ICD-10-CM | POA: Diagnosis not present

## 2022-02-22 DIAGNOSIS — F411 Generalized anxiety disorder: Secondary | ICD-10-CM | POA: Diagnosis not present

## 2022-02-22 DIAGNOSIS — F4312 Post-traumatic stress disorder, chronic: Secondary | ICD-10-CM | POA: Diagnosis not present

## 2022-03-16 DIAGNOSIS — J101 Influenza due to other identified influenza virus with other respiratory manifestations: Secondary | ICD-10-CM | POA: Diagnosis not present

## 2022-03-16 DIAGNOSIS — R509 Fever, unspecified: Secondary | ICD-10-CM | POA: Diagnosis not present

## 2022-03-22 ENCOUNTER — Encounter: Payer: Self-pay | Admitting: Family Medicine

## 2022-03-24 ENCOUNTER — Ambulatory Visit (INDEPENDENT_AMBULATORY_CARE_PROVIDER_SITE_OTHER): Payer: Medicaid Other | Admitting: Family Medicine

## 2022-03-24 ENCOUNTER — Other Ambulatory Visit: Payer: Self-pay

## 2022-03-24 ENCOUNTER — Encounter: Payer: Self-pay | Admitting: Family Medicine

## 2022-03-24 VITALS — BP 124/80 | HR 78 | Ht 64.0 in | Wt 169.0 lb

## 2022-03-24 DIAGNOSIS — G2581 Restless legs syndrome: Secondary | ICD-10-CM

## 2022-03-24 DIAGNOSIS — G4730 Sleep apnea, unspecified: Secondary | ICD-10-CM | POA: Diagnosis not present

## 2022-03-24 MED ORDER — ROPINIROLE HCL 2 MG PO TABS: 2.0000 mg | ORAL_TABLET | Freq: Every day | ORAL | 1 refills | Status: DC

## 2022-03-24 NOTE — Progress Notes (Signed)
Date:  03/24/2022   Name:  Lauren Boyd   DOB:  04/11/1980   MRN:  [id number]   Chief Complaint: Sleep Apnea (Snoring, witnessed apnea, daytime sleepiness, jerking movements)  Patient is a 42 year old female who presents for a face to face sleepapnea  exam. The patient reports the following problems: restless legs. Health maintenance has been reviewed up to date.     Lab Results  Component Value Date   NA 140 12/02/2018   K 4.0 12/02/2018   CO2 24 12/02/2018   GLUCOSE 101 (H) 12/02/2018   BUN 25 (H) 12/02/2018   CREATININE 0.83 12/02/2018   CALCIUM 9.3 12/02/2018   GFRNONAA >60 12/02/2018   Lab Results  Component Value Date   CHOL 142 11/30/2018   HDL 72 11/30/2018   LDLCALC 61 11/30/2018   TRIG 38 11/30/2018   CHOLHDL 2.0 11/30/2018   Lab Results  Component Value Date   TSH 1.144 01/07/2018   No results found for: "HGBA1C" Lab Results  Component Value Date   WBC 6.8 11/21/2019   HGB 12.4 11/21/2019   HCT 37.4 11/21/2019   MCV 88.8 11/21/2019   PLT 271 11/21/2019   Lab Results  Component Value Date   ALT 16 12/02/2018   AST 17 12/02/2018   ALKPHOS 52 12/02/2018   BILITOT 0.5 12/02/2018   No results found for: "25OHVITD2", "25OHVITD3", "VD25OH"   Review of Systems  Constitutional: Negative.  Negative for chills, fatigue, fever and unexpected weight change.  HENT:  Negative for congestion, ear discharge, ear pain, rhinorrhea, sinus pressure, sneezing and sore throat.   Respiratory:  Negative for cough, shortness of breath, wheezing and stridor.   Gastrointestinal:  Negative for abdominal pain, blood in stool, constipation, diarrhea and nausea.  Genitourinary:  Negative for dysuria, flank pain, frequency, hematuria, urgency and vaginal discharge.  Musculoskeletal:  Negative for arthralgias, back pain and myalgias.  Skin:  Negative for rash.  Neurological:  Negative for dizziness, weakness and headaches.  Hematological:  Negative for adenopathy. Does not  bruise/bleed easily.  Psychiatric/Behavioral:  Negative for dysphoric mood. The patient is not nervous/anxious.     Patient Active Problem List   Diagnosis Date Noted   Recurrent umbilical hernia    Low ferritin 08/02/2019   B12 deficiency 08/02/2019   Difficulty walking 07/18/2019   Numbness and tingling of left side of face 07/18/2019   Calculus of gallbladder without cholecystitis without obstruction 08/02/2016    Allergies  Allergen Reactions   Sertraline     Talking to self and "doing things I didn't realize I was doing"   Vyvanse [Lisdexamfetamine]     Chewing lips and crazy thoughts    Past Surgical History:  Procedure Laterality Date   CHOLECYSTECTOMY  2018   HERNIA REPAIR  2014   Umbical   INSERTION OF MESH  02/09/2021   Procedure: INSERTION OF MESH;  Surgeon: Henrene Dodge, MD;  Location: [facility] ORS;  Service: General;;   TUBAL LIGATION      Social History   Tobacco Use   Smoking status: Former    Types: Cigarettes   Smokeless tobacco: Never  Vaping Use   Vaping Use: Never used  Substance Use Topics   Alcohol use: Yes    Comment: occasional   Drug use: Not Currently     Medication list has been reviewed and updated.  Current Meds  Medication Sig   acetaminophen (TYLENOL) 500 MG tablet Take 2 tablets (1,000 mg total) by mouth  every 6 (six) hours as needed for mild pain.   cyanocobalamin 1000 MCG tablet Take 1,000 mcg by mouth daily.   escitalopram (LEXAPRO) 10 MG tablet Take 1 tablet (10 mg total) by mouth daily. Star with one half a day first 2 weeks   ferrous sulfate 324 MG TBEC Take 324 mg by mouth daily with breakfast.   ibuprofen (ADVIL) 800 MG tablet Take 1 tablet (800 mg total) by mouth every 8 (eight) hours as needed for moderate pain.   VITAMIN D PO Take 1 capsule by mouth 3 (three) times a week.       03/24/2022    1:39 PM 01/03/2022    1:53 PM 05/13/2021    2:05 PM 04/02/2020   10:47 AM  GAD 7 : Generalized Anxiety Score  Nervous,  Anxious, on Edge 0 1 0 0  Control/stop worrying 0 3 0 0  Worry too much - different things 0 3 0 0  Trouble relaxing 0 2 0 0  Restless 0 0 0 0  Easily annoyed or irritable 0 0 0 0  Afraid - awful might happen 0 0 0 0  Total GAD 7 Score 0 9 0 0  Anxiety Difficulty Not difficult at all Not difficult at all Not difficult at all        03/24/2022    1:39 PM 01/03/2022    1:52 PM 05/13/2021    2:05 PM  Depression screen PHQ 2/9  Decreased Interest 0 3 0  Down, Depressed, Hopeless 0 2 0  PHQ - 2 Score 0 5 0  Altered sleeping 0 3 3  Tired, decreased energy 0 1 0  Change in appetite 0 2 0  Feeling bad or failure about yourself  0 1 0  Trouble concentrating 0 2 0  Moving slowly or fidgety/restless 0 0 0  Suicidal thoughts 0 0 0  PHQ-9 Score 0 14 3  Difficult doing work/chores Not difficult at all Very difficult Not difficult at all    BP Readings from Last 3 Encounters:  03/24/22 124/80  01/03/22 128/78  05/13/21 130/80    Physical Exam Vitals and nursing note reviewed. Exam conducted with a chaperone present.  Constitutional:      General: She is not in acute distress.    Appearance: She is not diaphoretic.  HENT:     Head: Normocephalic and atraumatic.     Right Ear: External ear normal.     Left Ear: External ear normal.     Nose: Nose normal.  Eyes:     General:        Right eye: No discharge.        Left eye: No discharge.     Conjunctiva/sclera: Conjunctivae normal.     Pupils: Pupils are equal, round, and reactive to light.  Neck:     Thyroid: No thyromegaly.     Vascular: No JVD.  Cardiovascular:     Rate and Rhythm: Normal rate and regular rhythm.     Heart sounds: Normal heart sounds. No murmur heard.    No friction rub. No gallop.  Pulmonary:     Effort: Pulmonary effort is normal.     Breath sounds: Normal breath sounds.  Abdominal:     General: Bowel sounds are normal.     Palpations: Abdomen is soft. There is no mass.     Tenderness: There is no  abdominal tenderness. There is no guarding.  Musculoskeletal:        General:  Normal range of motion.     Cervical back: Normal range of motion and neck supple.  Lymphadenopathy:     Cervical: No cervical adenopathy.  Skin:    General: Skin is warm and dry.  Neurological:     Mental Status: She is alert.     Deep Tendon Reflexes: Reflexes are normal and symmetric.    Wt Readings from Last 3 Encounters:  03/24/22 169 lb (76.7 kg)  01/03/22 167 lb (75.8 kg)  05/13/21 163 lb (73.9 kg)    BP 124/80   Pulse 78   Ht 5\' 4"  (1.626 m)   Wt 169 lb (76.7 kg)   LMP 02/25/2022 (Approximate)   SpO2 99%   BMI 29.01 kg/m   Assessment and Plan:  1. Sleep apnea in adult New onset.  But probably has been existing for several years.  Patient has loud snoring with witnessed apneic episodes.  Patient has an Epworth score of 18.  We will refer to neurology for formal evaluation and if positive for obstructive sleep apnea proceed to CPAP selection and titration. - Ambulatory referral to ENT  2. Restless legs Chronic.  Uncontrolled.  Stable.  Patient has shaking of her whole body which I think is probably unrecognized restless leg syndrome.  We will have a trial of 2 mg nightly of Requip to see if this helps the symptomatology at all prior to sleep apnea and then when sleep apnea is evaluated will come off medication so this can be measured as well.  If the Requip seems to help with symptomatology we will prescribe as well - rOPINIRole (REQUIP) 2 MG tablet; Take 1 tablet (2 mg total) by mouth at bedtime.  Dispense: 30 tablet; Refill: 1    Elizabeth Sauer, MD

## 2022-03-26 ENCOUNTER — Encounter: Payer: Self-pay | Admitting: Family Medicine

## 2022-03-29 DIAGNOSIS — F4312 Post-traumatic stress disorder, chronic: Secondary | ICD-10-CM | POA: Diagnosis not present

## 2022-03-29 DIAGNOSIS — F329 Major depressive disorder, single episode, unspecified: Secondary | ICD-10-CM | POA: Diagnosis not present

## 2022-03-30 ENCOUNTER — Encounter: Payer: Self-pay | Admitting: Family Medicine

## 2022-04-21 DIAGNOSIS — E663 Overweight: Secondary | ICD-10-CM | POA: Diagnosis not present

## 2022-04-21 DIAGNOSIS — R0683 Snoring: Secondary | ICD-10-CM | POA: Diagnosis not present

## 2022-04-23 ENCOUNTER — Encounter: Payer: Self-pay | Admitting: Family Medicine

## 2022-04-23 DIAGNOSIS — R079 Chest pain, unspecified: Secondary | ICD-10-CM | POA: Diagnosis not present

## 2022-04-23 DIAGNOSIS — R0789 Other chest pain: Secondary | ICD-10-CM | POA: Diagnosis not present

## 2022-04-23 DIAGNOSIS — R1013 Epigastric pain: Secondary | ICD-10-CM | POA: Diagnosis not present

## 2022-04-23 DIAGNOSIS — R945 Abnormal results of liver function studies: Secondary | ICD-10-CM | POA: Diagnosis not present

## 2022-04-28 DIAGNOSIS — F4312 Post-traumatic stress disorder, chronic: Secondary | ICD-10-CM | POA: Diagnosis not present

## 2022-04-28 DIAGNOSIS — F411 Generalized anxiety disorder: Secondary | ICD-10-CM | POA: Diagnosis not present

## 2022-04-28 DIAGNOSIS — F329 Major depressive disorder, single episode, unspecified: Secondary | ICD-10-CM | POA: Diagnosis not present

## 2022-05-05 ENCOUNTER — Other Ambulatory Visit: Payer: Self-pay | Admitting: Nurse Practitioner

## 2022-05-05 DIAGNOSIS — R1013 Epigastric pain: Secondary | ICD-10-CM

## 2022-05-05 DIAGNOSIS — R768 Other specified abnormal immunological findings in serum: Secondary | ICD-10-CM | POA: Diagnosis not present

## 2022-05-05 DIAGNOSIS — R760 Raised antibody titer: Secondary | ICD-10-CM | POA: Diagnosis not present

## 2022-05-05 DIAGNOSIS — K769 Liver disease, unspecified: Secondary | ICD-10-CM | POA: Diagnosis not present

## 2022-05-05 DIAGNOSIS — R799 Abnormal finding of blood chemistry, unspecified: Secondary | ICD-10-CM | POA: Diagnosis not present

## 2022-05-05 DIAGNOSIS — R791 Abnormal coagulation profile: Secondary | ICD-10-CM | POA: Diagnosis not present

## 2022-05-05 DIAGNOSIS — Z1159 Encounter for screening for other viral diseases: Secondary | ICD-10-CM | POA: Diagnosis not present

## 2022-05-05 DIAGNOSIS — D1803 Hemangioma of intra-abdominal structures: Secondary | ICD-10-CM | POA: Diagnosis not present

## 2022-05-05 DIAGNOSIS — R748 Abnormal levels of other serum enzymes: Secondary | ICD-10-CM

## 2022-05-05 DIAGNOSIS — R945 Abnormal results of liver function studies: Secondary | ICD-10-CM | POA: Diagnosis not present

## 2022-05-05 DIAGNOSIS — R7989 Other specified abnormal findings of blood chemistry: Secondary | ICD-10-CM | POA: Diagnosis not present

## 2022-05-09 ENCOUNTER — Ambulatory Visit
Admission: RE | Admit: 2022-05-09 | Discharge: 2022-05-09 | Disposition: A | Discharge status: Home / Self Care | Payer: 59 | Source: Ambulatory Visit | Attending: Nurse Practitioner | Admitting: Nurse Practitioner

## 2022-05-09 ENCOUNTER — Encounter: Payer: Self-pay | Admitting: Hematology and Oncology

## 2022-05-09 DIAGNOSIS — R1013 Epigastric pain: Secondary | ICD-10-CM

## 2022-05-09 DIAGNOSIS — Z9049 Acquired absence of other specified parts of digestive tract: Secondary | ICD-10-CM | POA: Diagnosis not present

## 2022-05-09 DIAGNOSIS — R748 Abnormal levels of other serum enzymes: Secondary | ICD-10-CM

## 2022-05-09 DIAGNOSIS — D1803 Hemangioma of intra-abdominal structures: Secondary | ICD-10-CM

## 2022-05-19 ENCOUNTER — Encounter: Payer: Self-pay | Admitting: Family Medicine

## 2022-05-20 ENCOUNTER — Ambulatory Visit (INDEPENDENT_AMBULATORY_CARE_PROVIDER_SITE_OTHER): Payer: Medicaid Other | Admitting: Family Medicine

## 2022-05-20 ENCOUNTER — Encounter: Payer: Self-pay | Admitting: Family Medicine

## 2022-05-20 VITALS — BP 120/78 | HR 80 | Ht 64.0 in | Wt 169.0 lb

## 2022-05-20 DIAGNOSIS — R1013 Epigastric pain: Secondary | ICD-10-CM

## 2022-05-20 DIAGNOSIS — R768 Other specified abnormal immunological findings in serum: Secondary | ICD-10-CM

## 2022-05-20 DIAGNOSIS — R7401 Elevation of levels of liver transaminase levels: Secondary | ICD-10-CM | POA: Diagnosis not present

## 2022-05-20 NOTE — Progress Notes (Signed)
Date:  05/20/2022   Name:  Lauren Boyd   DOB:  [birth year]   MRN:  [id number]   Chief Complaint: elevated ANA (Needs referral to Rheumatology)  HPI  Lab Results  Component Value Date   NA 140 12/02/2018   K 4.0 12/02/2018   CO2 24 12/02/2018   GLUCOSE 101 (H) 12/02/2018   BUN 25 (H) 12/02/2018   CREATININE 0.83 12/02/2018   CALCIUM 9.3 12/02/2018   GFRNONAA >60 12/02/2018   Lab Results  Component Value Date   CHOL 142 11/30/2018   HDL 72 11/30/2018   LDLCALC 61 11/30/2018   TRIG 38 11/30/2018   CHOLHDL 2.0 11/30/2018   Lab Results  Component Value Date   TSH 1.144 01/07/2018   No results found for: "HGBA1C" Lab Results  Component Value Date   WBC 6.8 11/21/2019   HGB 12.4 11/21/2019   HCT 37.4 11/21/2019   MCV 88.8 11/21/2019   PLT 271 11/21/2019   Lab Results  Component Value Date   ALT 16 12/02/2018   AST 17 12/02/2018   ALKPHOS 52 12/02/2018   BILITOT 0.5 12/02/2018   No results found for: "25OHVITD2", "25OHVITD3", "VD25OH"   Review of Systems  Constitutional: Negative.  Negative for chills, fatigue, fever and unexpected weight change.  HENT:  Negative for congestion, drooling, ear discharge, ear pain, rhinorrhea, sinus pressure, sneezing and sore throat.   Respiratory:  Negative for cough, shortness of breath, wheezing and stridor.   Cardiovascular:  Negative for chest pain, palpitations and leg swelling.  Gastrointestinal:  Negative for abdominal pain, blood in stool, constipation, diarrhea and nausea.  Endocrine: Negative for polydipsia.  Genitourinary:  Negative for dysuria, flank pain, frequency, hematuria, urgency and vaginal discharge.  Musculoskeletal:  Negative for arthralgias, back pain, myalgias and neck pain.  Skin:  Negative for rash.  Allergic/Immunologic: Negative for environmental allergies.  Neurological:  Negative for dizziness, weakness and headaches.  Hematological:  Negative for adenopathy. Does not bruise/bleed easily.   Psychiatric/Behavioral:  Negative for dysphoric mood and suicidal ideas. The patient is not nervous/anxious.     Patient Active Problem List   Diagnosis Date Noted   Recurrent umbilical hernia    Low ferritin 08/02/2019   B12 deficiency 08/02/2019   Difficulty walking 07/18/2019   Numbness and tingling of left side of face 07/18/2019   Calculus of gallbladder without cholecystitis without obstruction 08/02/2016    Allergies  Allergen Reactions   Sertraline     Talking to self and "doing things I didn't realize I was doing"   Vyvanse [Lisdexamfetamine]     Chewing lips and crazy thoughts    Past Surgical History:  Procedure Laterality Date   CHOLECYSTECTOMY  2018   HERNIA REPAIR  2014   Umbical   INSERTION OF MESH  02/09/2021   Procedure: INSERTION OF MESH;  Surgeon: Henrene Dodge, MD;  Location: [facility] ORS;  Service: General;;   TUBAL LIGATION      Social History   Tobacco Use   Smoking status: Former    Types: Cigarettes   Smokeless tobacco: Never  Vaping Use   Vaping Use: Never used  Substance Use Topics   Alcohol use: Yes    Comment: occasional   Drug use: Not Currently     Medication list has been reviewed and updated.  Current Meds  Medication Sig   acetaminophen (TYLENOL) 500 MG tablet Take 2 tablets (1,000 mg total) by mouth every 6 (six) hours as needed for mild pain.  cyanocobalamin 1000 MCG tablet Take 1,000 mcg by mouth daily.   escitalopram (LEXAPRO) 10 MG tablet Take 1 tablet (10 mg total) by mouth daily. Star with one half a day first 2 weeks   ferrous sulfate 324 MG TBEC Take 324 mg by mouth daily with breakfast.   ibuprofen (ADVIL) 800 MG tablet Take 1 tablet (800 mg total) by mouth every 8 (eight) hours as needed for moderate pain.   Multiple Vitamin (MULTIVITAMIN) tablet Take 1 tablet by mouth daily.   rOPINIRole (REQUIP) 2 MG tablet Take 1 tablet (2 mg total) by mouth at bedtime.   VITAMIN D PO Take 1 capsule by mouth 3 (three) times a  week.       05/20/2022   10:13 AM 03/24/2022    1:39 PM 01/03/2022    1:53 PM 05/13/2021    2:05 PM  GAD 7 : Generalized Anxiety Score  Nervous, Anxious, on Edge 0 0 1 0  Control/stop worrying 0 0 3 0  Worry too much - different things 0 0 3 0  Trouble relaxing 0 0 2 0  Restless 0 0 0 0  Easily annoyed or irritable 0 0 0 0  Afraid - awful might happen 0 0 0 0  Total GAD 7 Score 0 0 9 0  Anxiety Difficulty Not difficult at all Not difficult at all Not difficult at all Not difficult at all       05/20/2022   10:12 AM 03/24/2022    1:39 PM 01/03/2022    1:52 PM  Depression screen PHQ 2/9  Decreased Interest 0 0 3  Down, Depressed, Hopeless 0 0 2  PHQ - 2 Score 0 0 5  Altered sleeping 0 0 3  Tired, decreased energy 0 0 1  Change in appetite 0 0 2  Feeling bad or failure about yourself  0 0 1  Trouble concentrating 0 0 2  Moving slowly or fidgety/restless 0 0 0  Suicidal thoughts 0 0 0  PHQ-9 Score 0 0 14  Difficult doing work/chores Not difficult at all Not difficult at all Very difficult    BP Readings from Last 3 Encounters:  05/20/22 120/78  03/24/22 124/80  01/03/22 128/78    Physical Exam HENT:     Right Ear: Tympanic membrane normal.     Left Ear: Tympanic membrane normal.     Nose: Nose normal.     Mouth/Throat:     Mouth: Mucous membranes are moist.  Eyes:     Pupils: Pupils are equal, round, and reactive to light.  Cardiovascular:     Pulses: Normal pulses.     Heart sounds: Normal heart sounds. No murmur heard.    No friction rub. No gallop.  Pulmonary:     Effort: Pulmonary effort is normal.     Breath sounds: No wheezing, rhonchi or rales.  Abdominal:     General: Abdomen is flat.     Tenderness: There is no abdominal tenderness. There is no guarding or rebound.  Musculoskeletal:     Cervical back: Normal range of motion.  Neurological:     Mental Status: She is alert.     Wt Readings from Last 3 Encounters:  05/20/22 169 lb (76.7 kg)   03/24/22 169 lb (76.7 kg)  01/03/22 167 lb (75.8 kg)    BP 120/78   Pulse 80   Ht 5\' 4"  (1.626 m)   Wt 169 lb (76.7 kg)   LMP 04/21/2022 (Approximate)   SpO2  99%   BMI 29.01 kg/m   Assessment and Plan:  1. Epigastric pain Chronic.  Episodic.  Recurrent.  Patient is still having episodic issues with episodic pain nonradiating associated with nausea.  Was seen in the emergency room on May 05, 2022 for epigastric pain which resolved on fentanyl and Zofran and aspirin.  Patient states that she has had endoscopy in the past according to the patient.  Patient has also been treated for anemia and there was a recommendation of colonoscopy for evaluation.  Referral to return to [facility] in [facility] [place] [facility] has been placed.  2. Elevated transaminase level Chronic.  Controlled.  Stable.  Currently is followed by [facility] in [place].  Patient underwent ultrasound and is being evaluated and followed for liver hemangiomas and presumably from elevated transaminases.  3. Positive ANA (antinuclear antibody) During the course of evaluation an ANA was drawn which also had a FANA staining pattern which revealed a speckled pattern of 1-320.  It has been recommended to the patient that she follow-up with rheumatology and referral has been placed   Elizabeth Sauer, MD

## 2022-05-24 ENCOUNTER — Encounter: Payer: Self-pay | Admitting: Family Medicine

## 2022-05-31 ENCOUNTER — Encounter: Payer: Self-pay | Admitting: Family Medicine

## 2022-07-04 DIAGNOSIS — F411 Generalized anxiety disorder: Secondary | ICD-10-CM | POA: Diagnosis not present

## 2022-07-04 DIAGNOSIS — F331 Major depressive disorder, recurrent, moderate: Secondary | ICD-10-CM | POA: Diagnosis not present

## 2022-07-04 DIAGNOSIS — F4310 Post-traumatic stress disorder, unspecified: Secondary | ICD-10-CM | POA: Diagnosis not present

## 2022-07-11 ENCOUNTER — Encounter: Payer: Self-pay | Admitting: Family Medicine

## 2022-07-18 ENCOUNTER — Telehealth: Payer: Self-pay | Admitting: Family Medicine

## 2022-07-18 NOTE — Telephone Encounter (Signed)
Tried calling patient about a physical could not leave a voice mail, mailbox is full.

## 2022-07-22 ENCOUNTER — Encounter: Payer: Self-pay | Admitting: Family Medicine

## 2022-07-22 ENCOUNTER — Ambulatory Visit (INDEPENDENT_AMBULATORY_CARE_PROVIDER_SITE_OTHER): Payer: Medicaid Other | Admitting: Family Medicine

## 2022-07-22 VITALS — BP 110/78 | HR 72 | Ht 64.0 in | Wt 174.0 lb

## 2022-07-22 DIAGNOSIS — Z1231 Encounter for screening mammogram for malignant neoplasm of breast: Secondary | ICD-10-CM | POA: Diagnosis not present

## 2022-07-22 DIAGNOSIS — Z0000 Encounter for general adult medical examination without abnormal findings: Secondary | ICD-10-CM | POA: Diagnosis not present

## 2022-07-22 DIAGNOSIS — I10 Essential (primary) hypertension: Secondary | ICD-10-CM | POA: Diagnosis not present

## 2022-07-22 NOTE — Progress Notes (Signed)
Date:  07/22/2022   Name:  Lauren Boyd   DOB:  01-14-1981   MRN:  [id number]   Chief Complaint: Annual Exam (No pap)  Patient is a 42 year old female who presents for a comprehensive physical exam. The patient reports the following problems: none. Health maintenance has been reviewed up to date.      Lab Results  Component Value Date   NA 140 12/02/2018   K 4.0 12/02/2018   CO2 24 12/02/2018   GLUCOSE 101 (H) 12/02/2018   BUN 25 (H) 12/02/2018   CREATININE 0.83 12/02/2018   CALCIUM 9.3 12/02/2018   GFRNONAA >60 12/02/2018   Lab Results  Component Value Date   CHOL 142 11/30/2018   HDL 72 11/30/2018   LDLCALC 61 11/30/2018   TRIG 38 11/30/2018   CHOLHDL 2.0 11/30/2018   Lab Results  Component Value Date   TSH 1.144 01/07/2018   No results found for: "HGBA1C" Lab Results  Component Value Date   WBC 6.8 11/21/2019   HGB 12.4 11/21/2019   HCT 37.4 11/21/2019   MCV 88.8 11/21/2019   PLT 271 11/21/2019   Lab Results  Component Value Date   ALT 16 12/02/2018   AST 17 12/02/2018   ALKPHOS 52 12/02/2018   BILITOT 0.5 12/02/2018   No results found for: "25OHVITD2", "25OHVITD3", "VD25OH"   Review of Systems  Constitutional: Negative.  Negative for chills, fatigue, fever and unexpected weight change.  HENT:  Negative for congestion, ear discharge, ear pain, rhinorrhea, sinus pressure, sneezing and sore throat.   Respiratory:  Negative for cough, shortness of breath, wheezing and stridor.   Gastrointestinal:  Negative for abdominal pain, blood in stool, constipation, diarrhea and nausea.  Genitourinary:  Negative for dysuria, flank pain, frequency, hematuria, urgency and vaginal discharge.  Musculoskeletal:  Negative for arthralgias, back pain and myalgias.  Skin:  Negative for rash.  Neurological:  Negative for dizziness, weakness and headaches.  Hematological:  Negative for adenopathy. Does not bruise/bleed easily.  Psychiatric/Behavioral:  Negative for  dysphoric mood. The patient is not nervous/anxious.     Patient Active Problem List   Diagnosis Date Noted   Recurrent umbilical hernia    Low ferritin 08/02/2019   B12 deficiency 08/02/2019   Difficulty walking 07/18/2019   Numbness and tingling of left side of face 07/18/2019   Calculus of gallbladder without cholecystitis without obstruction 08/02/2016    Allergies  Allergen Reactions   Requip [Ropinirole]     Headache/ migraine   Sertraline     Talking to self and "doing things I didn't realize I was doing"   Vyvanse [Lisdexamfetamine]     Chewing lips and crazy thoughts    Past Surgical History:  Procedure Laterality Date   CHOLECYSTECTOMY  2018   HERNIA REPAIR  2014   Umbical   INSERTION OF MESH  02/09/2021   Procedure: INSERTION OF MESH;  Surgeon: Henrene Dodge, MD;  Location: [facility];  Service: General;;   TUBAL LIGATION      Social History   Tobacco Use   Smoking status: Former    Types: Cigarettes   Smokeless tobacco: Never  Vaping Use   Vaping Use: Never used  Substance Use Topics   Alcohol use: Yes    Comment: occasional   Drug use: Not Currently     Medication list has been reviewed and updated.  Current Meds  Medication Sig   acetaminophen (TYLENOL) 500 MG tablet Take 2 tablets (1,000 mg total)  by mouth every 6 (six) hours as needed for mild pain.   cyanocobalamin 1000 MCG tablet Take 1,000 mcg by mouth daily.   escitalopram (LEXAPRO) 20 MG tablet Take 20 mg by mouth daily. [name]   ferrous sulfate 324 MG TBEC Take 324 mg by mouth daily with breakfast.   ibuprofen (ADVIL) 800 MG tablet Take 1 tablet (800 mg total) by mouth every 8 (eight) hours as needed for moderate pain.   Multiple Vitamin (MULTIVITAMIN) tablet Take 1 tablet by mouth daily.   topiramate (TOPAMAX) 25 MG tablet Take by mouth. Sisk   traZODone (DESYREL) 50 MG tablet Take 100 mg by mouth at bedtime. sisk   VITAMIN D PO Take 1 capsule by mouth 3 (three) times a week.        07/22/2022    8:51 AM 05/20/2022   10:13 AM 03/24/2022    1:39 PM 01/03/2022    1:53 PM  GAD 7 : Generalized Anxiety Score  Nervous, Anxious, on Edge 0 0 0 1  Control/stop worrying 0 0 0 3  Worry too much - different things 0 0 0 3  Trouble relaxing 0 0 0 2  Restless 0 0 0 0  Easily annoyed or irritable 0 0 0 0  Afraid - awful might happen 0 0 0 0  Total GAD 7 Score 0 0 0 9  Anxiety Difficulty Not difficult at all Not difficult at all Not difficult at all Not difficult at all       07/22/2022    8:51 AM 05/20/2022   10:12 AM 03/24/2022    1:39 PM  Depression screen PHQ 2/9  Decreased Interest 0 0 0  Down, Depressed, Hopeless 0 0 0  PHQ - 2 Score 0 0 0  Altered sleeping 0 0 0  Tired, decreased energy 0 0 0  Change in appetite 0 0 0  Feeling bad or failure about yourself  0 0 0  Trouble concentrating 0 0 0  Moving slowly or fidgety/restless 0 0 0  Suicidal thoughts 0 0 0  PHQ-9 Score 0 0 0  Difficult doing work/chores Not difficult at all Not difficult at all Not difficult at all    BP Readings from Last 3 Encounters:  07/22/22 110/78  05/20/22 120/78  03/24/22 124/80    Physical Exam Vitals and nursing note reviewed. Exam conducted with a chaperone present.  Constitutional:      General: She is not in acute distress.    Appearance: She is well-groomed. She is not diaphoretic.  HENT:     Head: Normocephalic and atraumatic.     Right Ear: Hearing, tympanic membrane, ear canal and external ear normal.     Left Ear: Hearing, tympanic membrane, ear canal and external ear normal.     Nose: Nose normal.     Mouth/Throat:     Lips: Pink.     Mouth: Mucous membranes are moist.     Tongue: No lesions.     Palate: No mass.     Pharynx: Oropharynx is clear.  Eyes:     General: Lids are normal. Vision grossly intact. Gaze aligned appropriately.        Right eye: No discharge.        Left eye: No discharge.     Extraocular Movements: Extraocular movements intact.      Conjunctiva/sclera: Conjunctivae normal.     Pupils: Pupils are equal, round, and reactive to light.  Neck:     Thyroid:  No thyroid mass, thyromegaly or thyroid tenderness.     Vascular: Normal carotid pulses. No carotid bruit, hepatojugular reflux or JVD.     Trachea: Trachea and phonation normal.  Cardiovascular:     Rate and Rhythm: Normal rate and regular rhythm.     Chest Wall: PMI is not displaced.     Pulses: Normal pulses.          Carotid pulses are 2+ on the right side and 2+ on the left side.      Radial pulses are 2+ on the right side and 2+ on the left side.     Heart sounds: Normal heart sounds, S1 normal and S2 normal. No murmur heard.    No systolic murmur is present.     No diastolic murmur is present.     No friction rub. No gallop. No S3 or S4 sounds.  Pulmonary:     Effort: Pulmonary effort is normal.     Breath sounds: Normal breath sounds. No decreased air movement. No decreased breath sounds, wheezing or rhonchi.  Chest:  Breasts:    Right: Normal. No swelling, bleeding, inverted nipple, mass, nipple discharge, skin change or tenderness.     Left: Normal. No swelling, bleeding, inverted nipple, mass, nipple discharge, skin change or tenderness.  Abdominal:     General: Bowel sounds are normal.     Palpations: Abdomen is soft. There is no hepatomegaly, splenomegaly or mass.     Tenderness: There is no abdominal tenderness. There is no guarding.     Hernia: There is no hernia in the umbilical area or ventral area.  Musculoskeletal:        General: Normal range of motion.     Cervical back: Full passive range of motion without pain, normal range of motion and neck supple.  Lymphadenopathy:     Head:     Right side of head: No submental or submandibular adenopathy.     Left side of head: No submental or submandibular adenopathy.     Cervical: No cervical adenopathy.     Right cervical: No superficial, deep or posterior cervical adenopathy.    Left cervical: No  superficial, deep or posterior cervical adenopathy.     Upper Body:     Right upper body: No supraclavicular adenopathy.     Left upper body: No supraclavicular adenopathy.  Skin:    General: Skin is warm and dry.     Capillary Refill: Capillary refill takes less than 2 seconds.  Neurological:     Mental Status: She is alert.     Cranial Nerves: Cranial nerves 2-12 are intact.     Sensory: Sensation is intact.     Motor: Motor function is intact.     Deep Tendon Reflexes: Reflexes are normal and symmetric.  Psychiatric:        Behavior: Behavior is cooperative.     Wt Readings from Last 3 Encounters:  07/22/22 174 lb (78.9 kg)  05/20/22 169 lb (76.7 kg)  03/24/22 169 lb (76.7 kg)    BP 110/78   Pulse 72   Ht 5\' 4"  (1.626 m)   Wt 174 lb (78.9 kg)   LMP 07/22/2022 (Exact Date)   SpO2 96%   BMI 29.87 kg/m   Assessment and Plan:  Krys Fusselman is a 42 y.o. female who presents today for her Complete Annual Exam. She feels fairly well. She reports exercising . She reports she is sleeping fairly well.  Immunizations are reviewed and  recommendations provided.   Age appropriate screening tests are discussed. Counseling given for risk factor reduction interventions.  1. Annual physical exam No subjective/objective concerns noted during HPI, review of past medical history, review of systems, and physical exam.  Review of previous labs acceptable but needs lipid panel drawn. - Lipid Panel With LDL/HDL Ratio  2. Breast cancer screening by mammogram Discussed with patient.  Breast exam negative for palpable masses.  Will refer for screening mammogram. - MM 3D SCREENING MAMMOGRAM BILATERAL BREAST   Elizabeth Sauer, MD

## 2022-07-23 LAB — LIPID PANEL WITH LDL/HDL RATIO
Cholesterol, Total: 178 mg/dL (ref 100–199)
HDL: 63 mg/dL (ref >39)
LDL Chol Calc (NIH): 98 mg/dL (ref 0–99)
LDL/HDL Ratio: 1.6 ratio (ref 0.0–3.2)
Triglycerides: 91 mg/dL (ref 0–149)
VLDL Cholesterol Cal: 17 mg/dL (ref 5–40)

## 2022-07-28 ENCOUNTER — Other Ambulatory Visit: Payer: Self-pay

## 2022-07-28 DIAGNOSIS — N6452 Nipple discharge: Secondary | ICD-10-CM

## 2022-07-28 NOTE — Progress Notes (Signed)
Ordered mammo and US breasts

## 2022-08-05 ENCOUNTER — Encounter: Payer: Self-pay | Admitting: Hematology and Oncology

## 2022-08-05 ENCOUNTER — Ambulatory Visit
Admission: RE | Admit: 2022-08-05 | Discharge: 2022-08-05 | Disposition: A | Discharge status: Home / Self Care | Payer: Medicaid Other | Source: Ambulatory Visit | Attending: Family Medicine | Admitting: Family Medicine

## 2022-08-05 DIAGNOSIS — N6452 Nipple discharge: Secondary | ICD-10-CM | POA: Diagnosis not present

## 2022-08-09 ENCOUNTER — Other Ambulatory Visit: Payer: Self-pay | Admitting: Family Medicine

## 2022-08-09 DIAGNOSIS — R768 Other specified abnormal immunological findings in serum: Secondary | ICD-10-CM | POA: Diagnosis not present

## 2022-08-09 DIAGNOSIS — N63 Unspecified lump in unspecified breast: Secondary | ICD-10-CM

## 2022-08-09 DIAGNOSIS — R748 Abnormal levels of other serum enzymes: Secondary | ICD-10-CM | POA: Diagnosis not present

## 2022-08-09 DIAGNOSIS — R928 Other abnormal and inconclusive findings on diagnostic imaging of breast: Secondary | ICD-10-CM

## 2022-08-10 ENCOUNTER — Encounter: Payer: Self-pay | Admitting: Family Medicine

## 2022-08-12 ENCOUNTER — Encounter: Payer: Self-pay | Admitting: Hematology and Oncology

## 2022-08-12 ENCOUNTER — Ambulatory Visit
Admission: RE | Admit: 2022-08-12 | Discharge: 2022-08-12 | Disposition: A | Discharge status: Home / Self Care | Payer: Medicaid Other | Source: Ambulatory Visit | Attending: Family Medicine | Admitting: Family Medicine

## 2022-08-12 DIAGNOSIS — N63 Unspecified lump in unspecified breast: Secondary | ICD-10-CM | POA: Insufficient documentation

## 2022-08-12 DIAGNOSIS — R928 Other abnormal and inconclusive findings on diagnostic imaging of breast: Secondary | ICD-10-CM | POA: Insufficient documentation

## 2022-08-12 DIAGNOSIS — N6313 Unspecified lump in the right breast, lower outer quadrant: Secondary | ICD-10-CM | POA: Diagnosis not present

## 2022-08-12 DIAGNOSIS — D241 Benign neoplasm of right breast: Secondary | ICD-10-CM | POA: Diagnosis not present

## 2022-08-12 HISTORY — PX: BREAST BIOPSY: SHX20

## 2022-08-12 MED ORDER — LIDOCAINE-EPINEPHRINE 1 %-1:100000 IJ SOLN
5.0000 mL | Freq: Once | INTRAMUSCULAR | Status: AC
  Administered 2022-08-12: 5 mL
  Filled 2022-08-12: qty 5

## 2022-08-12 MED ORDER — LIDOCAINE HCL 1 % IJ SOLN
1.0000 mL | Freq: Once | INTRAMUSCULAR | Status: AC
  Administered 2022-08-12: 1 mL via INTRADERMAL
  Filled 2022-08-12: qty 1

## 2022-08-15 ENCOUNTER — Encounter: Payer: Self-pay | Admitting: *Deleted

## 2022-08-15 DIAGNOSIS — D241 Benign neoplasm of right breast: Secondary | ICD-10-CM

## 2022-08-15 LAB — SURGICAL PATHOLOGY

## 2022-08-15 NOTE — Progress Notes (Signed)
Referral recieved from [facility] for benign breast mass. Referral placed to Dr. Aleen Campi per patient preference.  She has previously seen him for a separate issue.

## 2022-08-18 ENCOUNTER — Ambulatory Visit: Payer: 59

## 2022-08-23 ENCOUNTER — Ambulatory Visit: Payer: 59

## 2022-08-25 ENCOUNTER — Encounter: Payer: Self-pay | Admitting: Family Medicine

## 2022-08-29 ENCOUNTER — Other Ambulatory Visit: Payer: Self-pay | Admitting: Surgery

## 2022-08-29 ENCOUNTER — Ambulatory Visit: Payer: Medicaid Other | Admitting: Surgery

## 2022-08-29 ENCOUNTER — Encounter: Payer: Self-pay | Admitting: Surgery

## 2022-08-29 VITALS — BP 157/86 | HR 60 | Temp 98.0°F | Ht 64.0 in | Wt 175.0 lb

## 2022-08-29 DIAGNOSIS — R928 Other abnormal and inconclusive findings on diagnostic imaging of breast: Secondary | ICD-10-CM

## 2022-08-29 DIAGNOSIS — N6313 Unspecified lump in the right breast, lower outer quadrant: Secondary | ICD-10-CM | POA: Diagnosis not present

## 2022-08-29 DIAGNOSIS — N6311 Unspecified lump in the right breast, upper outer quadrant: Secondary | ICD-10-CM | POA: Diagnosis not present

## 2022-08-29 DIAGNOSIS — D241 Benign neoplasm of right breast: Secondary | ICD-10-CM

## 2022-08-29 DIAGNOSIS — N63 Unspecified lump in unspecified breast: Secondary | ICD-10-CM

## 2022-08-29 NOTE — H&P (View-Only) (Signed)
 Addendum 09/12/22: The patient had biopsies of two additional areas in the right breast.  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  In the upper outer quadrant, the biopsy showed PASH and stromal fibrosis.  Dr. Si Gaul has recommended excision of both sites and I agree with him.  I discussed with the patient the recommendations and findings on biopsy.  She is in agreement.  She already had one tag placed for the retroareolar intraductal papilloma biopsied earlier, and she will need two additional tags for the two new sites.  We'll place orders for this.  We can continue her scheduled surgery date as is.  All of her questions have been answered.   08/29/2022  Reason for Visit:  Right breast intraductal papilloma  Requesting Provider:  Elizabeth Sauer, MD.  History of Present Illness: Lauren Boyd is a 42 y.o. female presenting for evaluation of a right breast intraductal papilloma.  The patient noted bilateral nipple discharge about two months ago.  She reports this was white in color, with more discharge from right nipple compared to left nipple.  The discharge has slowed down.  This is also associated with some discomfort, pressure sensation in the right breast.  Denies any worsening or persistent pain.  Denies any other changes to her skin or nipple areas.  She had a bilateral diagnostic mammogram on 08/05/2022 which showed a 7 mm right breast intraductal mass at the 7:00 retroareolar position, a 7 mm oval mass at the 7 o'clock position 5 cm from the nipple which was probably a fibroadenoma, and a right breast focal asymmetry in the upper outer quadrant which was likely normal fibroglandular tissue.  She had a biopsy of the intraductal mass on 08/12/2022 and this resulted in an intraductal papilloma without any evidence of atypia or malignancy.  Past Medical History: Past Medical History:  Diagnosis Date   History of  2019 novel coronavirus disease (COVID-19) 06/20/2019   PONV (postoperative nausea and vomiting)      Past Surgical History: Past Surgical History:  Procedure Laterality Date   BREAST BIOPSY Right 08/12/2022   [place] RT BREAST BX W LOC DEV 1ST LESION IMG BX SPEC US GUIDE 08/12/2022 [facility]   CHOLECYSTECTOMY  2018   HERNIA REPAIR  2014   Umbical   INSERTION OF MESH  02/09/2021   Procedure: INSERTION OF MESH;  Surgeon: Henrene Dodge, MD;  Location: [facility];  Service: General;;   TUBAL LIGATION      Home Medications: Prior to Admission medications   Medication Sig Start Date End Date Taking? Authorizing Provider  acetaminophen (TYLENOL) 500 MG tablet Take 2 tablets (1,000 mg total) by mouth every 6 (six) hours as needed for mild pain. 02/09/21  Yes Piscoya, Elita Quick, MD  ALPRAZolam Prudy Feeler) 0.25 MG tablet Take 1 tablet (0.25 mg total) by mouth 2 (two) times daily as needed for anxiety. 01/03/22  Yes [name], MD  cyanocobalamin 1000 MCG tablet Take 1,000 mcg by mouth daily.   Yes [provider]  escitalopram (LEXAPRO) 20 MG tablet Take 20 mg by mouth daily. [name]   Yes [provider]  ibuprofen (ADVIL) 800 MG tablet Take 1 tablet (800 mg total) by mouth every 8 (eight) hours as needed for moderate pain. 02/09/21  Yes Piscoya, Elita Quick, MD  Multiple Vitamin (MULTIVITAMIN) tablet Take 1 tablet by mouth daily.   Yes [provider]  topiramate (TOPAMAX) 25  MG tablet Take by mouth. Sisk 07/04/22  Yes [provider]  traZODone (DESYREL) 50 MG tablet Take 100 mg by mouth at bedtime. sisk   Yes [provider]  VITAMIN D PO Take 1 capsule by mouth 3 (three) times a week.   Yes [provider]    Allergies: Allergies  Allergen Reactions   Requip [Ropinirole]     Headache/ migraine   Sertraline     Talking to self and "doing things I didn't realize I was doing"   Vyvanse [Lisdexamfetamine]     Chewing lips and crazy  thoughts    Social History:  reports that she has quit smoking. Her smoking use included cigarettes. She has been exposed to tobacco smoke. She has never used smokeless tobacco. She reports current alcohol use. She reports that she does not currently use drugs.   Family History: Family History  Problem Relation Age of Onset   Healthy Mother    Healthy Father    Breast cancer Maternal Grandmother 67    Review of Systems: Review of Systems  Constitutional:  Negative for chills and fever.  HENT:  Negative for hearing loss.   Respiratory:  Negative for shortness of breath.   Cardiovascular:  Negative for chest pain.  Gastrointestinal:  Negative for abdominal pain, nausea and vomiting.  Genitourinary:  Negative for dysuria.  Musculoskeletal:  Negative for myalgias.  Skin:  Negative for rash.  Neurological:  Negative for dizziness.  Psychiatric/Behavioral:  Negative for depression.     Physical Exam BP (!) 157/86   Pulse 60   Temp 98 F (36.7 C)   Ht 5\' 4"  (1.626 m)   Wt 175 lb (79.4 kg)   LMP 08/15/2022 (Exact Date)   SpO2 98%   BMI 30.04 kg/m  CONSTITUTIONAL: No acute distress, well-nourished HEENT:  Normocephalic, atraumatic, extraocular motion intact. NECK: Trachea is midline, and there is no jugular venous distension.  RESPIRATORY:  Lungs are clear, and breath sounds are equal bilaterally. Normal respiratory effort without pathologic use of accessory muscles. CARDIOVASCULAR: Heart is regular without murmurs, gallops, or rubs. BREAST: Right breast with no palpable masses, skin changes, or nipple changes.  Currently no drainage from the right nipple.  Biopsy site is healing well with no evidence of bruising.  No right axillary lymphadenopathy.  Left breast without any palpable masses, skin changes, or nipple changes.  Also no drainage from the left nipple.  No left axillary lymphadenopathy. MUSCULOSKELETAL:  Normal muscle strength and tone in all four extremities.  No  peripheral edema or cyanosis. SKIN: Skin turgor is normal. There are no pathologic skin lesions.  NEUROLOGIC:  Motor and sensation is grossly normal.  Cranial nerves are grossly intact. PSYCH:  Alert and oriented to person, place and time. Affect is normal.  Laboratory Analysis: Labs from 05/05/2022: Sodium 141, potassium 4.6, chloride 104, CO2 22, BUN 10, creatinine 0.79.  Total bilirubin 0.3, AST 16, ALT 22, alkaline phosphatase 72, albumin 4.6.  WBC 7.3, hemoglobin 11.2, hematocrit 35.5, platelets 371.  Right breast biopsy on 08/12/2022: DIAGNOSIS:  A. BREAST, RIGHT 7:00 RETROAREOLAR; ULTRASOUND-GUIDED BIOPSY:  - INTRADUCTAL PAPILLOMA WITH FOCAL SCLEROSIS.  - NEGATIVE FOR ATYPIA AND MALIGNANCY.   Imaging: Mammogram and ultrasound on 08/05/2022: FINDINGS: MAMMOGRAM: RIGHT: There is a 6 mm oval circumscribed mass in the lower outer retroareolar right breast (CC image 67/83, spot MLO 1 of 4 image 43/65). There are associated punctate/round calcifications.   There is an additional 6 mm oval circumscribed mass in  the lower central right breast middle depth (CC image 72/83, MLO image 69/87).   There is a 30 mm focal asymmetry in the upper-outer right breast posterior depth that persists on spot tomosynthesis views but demonstrates interspersed focal fat (spot CC 2 of 2 image 55/91, spot MLO 2 of 2 image 60/83).   LEFT: Spot tomosynthesis views of the retroareolar left breast were obtained given history of nipple discharge. No suspicious findings are identified in this area.   No suspicious mass, calcification, or other findings are identified in the left breast.   ULTRASOUND: RIGHT: At 7 o'clock in the retroareolar position, there is an oval circumscribed hypoechoic intraductal mass with internal vascularity that measures 12 x 4 x 4 mm. There are few punctate echogenic foci within the mass. This corresponds with the retroareolar mass with associated calcifications seen on  mammogram.   At 7 o'clock 5 cm from the nipple, there is an oval circumscribed hypoechoic mass without internal vascularity. It measures 7 x 3 x 4 mm. This corresponds with the mass in the lower central right breast seen on mammogram.   Targeted right breast ultrasound at 10 o'clock 10 cm from nipple demonstrates normal fibroglandular tissue. No sonographic correlate for the focal asymmetry seen in the upper-outer quadrant is identified.   Targeted right axillary ultrasound demonstrates a morphologically benign lymph node. No lymphadenopathy.   LEFT: Targeted left retroareolar ultrasound was performed given history of nipple discharge. This demonstrates normal fibroglandular tissue. No suspicious solid or cystic mass or area of shadowing is identified.   IMPRESSION: 1. Right breast 7 mm intraductal mass in the 7 o'clock retroareolar position is low suspicion for malignancy and may be an etiology for right nipple discharge. Recommend further assessment with ultrasound-guided biopsy. 2. 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 3. Right breast focal asymmetry in the upper-outer quadrant, without a sonographic correlate, is probably benign and likely represents the patient's baseline configuration of fibroglandular tissue. 4. No mammographic or sonographic etiology for left nipple discharge is identified. No evidence of malignancy in the left breast.   RECOMMENDATION: 1. RIGHT breast ultrasound-guided biopsy of the mass in the 7 o'clock retroareolar position (1 site). 2. With benign results, recommend six-month follow-up RIGHT diagnostic mammogram and ultrasound to assess for stability of the probably benign mass in the 7 o'clock position 5 cm from the nipple and the focal asymmetry in the upper-outer quadrant. Biopsy results require surgical excision, consider biopsy  of these areas prior to surgery.   I have discussed the findings and recommendations with the patient. The biopsy procedure was discussed with the patient and questions were answered. Patient expressed their understanding of the biopsy recommendation. Patient will be scheduled for biopsy at her earliest convenience by the schedulers. Ordering provider will be notified. If applicable, a reminder letter will be sent to the patient regarding the next appointment.   BI-RADS CATEGORY  4: Suspicious.  Assessment and Plan: This is a 42 y.o. female with a right breast intraductal papilloma.  - Discussed with the patient the findings on her imaging studies as well as the biopsy results.  Overall she does have a retroareolar right breast intraductal mass consistent with a intraductal papilloma.  There is 2 other areas that potentially are benign in the right breast at the 7:00 and 10:00 positions.  Discussed with patient that intraductal papillomas are benign masses but she  would like to have the mass removed as a precaution.  I think this is reasonable for peace of mind to make sure there is no evidence of any other pathology.  Discussed with her that given the other 2 areas found on her right breast, radiology had recommended biopsy of these areas if we were to proceed with any excision as a precaution to make sure we do need to change our surgical plan for other excisions at the same time.  She is in agreement with this. - Discussed with her then the plan for biopsy of the 2 additional areas in the right breast as previously recommended.  Then also discussed with her the use of radiofrequency tag localization in order to localize the intraductal papilloma better and any potential other areas for excision.  This would then be followed by a tag localized lumpectomy in the right breast.  Discussed with her the surgery at length and reviewed with her the planned incision, risks of bleeding, infection, injury  to surrounding structures, that this would be an outpatient surgery, postoperative activity restrictions, pain control, and she is willing to proceed. - Tentatively, we will schedule her for surgery on 09/27/2022.  This may have to change based on timing of biopsies as well as RF tag placement.  I spent 55 minutes dedicated to the care of this patient on the date of this encounter to include pre-visit review of records, face-to-face time with the patient discussing diagnosis and management, and any post-visit coordination of care.   Howie Ill, MD [facility]

## 2022-08-29 NOTE — Progress Notes (Addendum)
Addendum 09/12/22: The patient had biopsies of two additional areas in the right breast.  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  In the upper outer quadrant, the biopsy showed PASH and stromal fibrosis.  Dr. Si Gaul has recommended excision of both sites and I agree with him.  I discussed with the patient the recommendations and findings on biopsy.  She is in agreement.  She already had one tag placed for the retroareolar intraductal papilloma biopsied earlier, and she will need two additional tags for the two new sites.  We'll place orders for this.  We can continue her scheduled surgery date as is.  All of her questions have been answered.   08/29/2022  Reason for Visit:  Right breast intraductal papilloma  Requesting Provider:  Elizabeth Sauer, MD.  History of Present Illness: Lauren Boyd is a 42 y.o. female presenting for evaluation of a right breast intraductal papilloma.  The patient noted bilateral nipple discharge about two months ago.  She reports this was white in color, with more discharge from right nipple compared to left nipple.  The discharge has slowed down.  This is also associated with some discomfort, pressure sensation in the right breast.  Denies any worsening or persistent pain.  Denies any other changes to her skin or nipple areas.  She had a bilateral diagnostic mammogram on 08/05/2022 which showed a 7 mm right breast intraductal mass at the 7:00 retroareolar position, a 7 mm oval mass at the 7 o'clock position 5 cm from the nipple which was probably a fibroadenoma, and a right breast focal asymmetry in the upper outer quadrant which was likely normal fibroglandular tissue.  She had a biopsy of the intraductal mass on 08/12/2022 and this resulted in an intraductal papilloma without any evidence of atypia or malignancy.  Past Medical History: Past Medical History:  Diagnosis Date   History of  2019 novel coronavirus disease (COVID-19) 06/20/2019   PONV (postoperative nausea and vomiting)      Past Surgical History: Past Surgical History:  Procedure Laterality Date   BREAST BIOPSY Right 08/12/2022   [place] RT BREAST BX W LOC DEV 1ST LESION IMG BX SPEC US GUIDE 08/12/2022 [facility]   CHOLECYSTECTOMY  2018   HERNIA REPAIR  2014   Umbical   INSERTION OF MESH  02/09/2021   Procedure: INSERTION OF MESH;  Surgeon: Henrene Dodge, MD;  Location: [facility] ORS;  Service: General;;   TUBAL LIGATION      Home Medications: Prior to Admission medications   Medication Sig Start Date End Date Taking? Authorizing Provider  acetaminophen (TYLENOL) 500 MG tablet Take 2 tablets (1,000 mg total) by mouth every 6 (six) hours as needed for mild pain. 02/09/21  Yes Alexi Dorminey, Elita Quick, MD  ALPRAZolam Prudy Feeler) 0.25 MG tablet Take 1 tablet (0.25 mg total) by mouth 2 (two) times daily as needed for anxiety. 01/03/22  Yes [name], MD  cyanocobalamin 1000 MCG tablet Take 1,000 mcg by mouth daily.   Yes [provider]  escitalopram (LEXAPRO) 20 MG tablet Take 20 mg by mouth daily. [name]   Yes [provider]  ibuprofen (ADVIL) 800 MG tablet Take 1 tablet (800 mg total) by mouth every 8 (eight) hours as needed for moderate pain. 02/09/21  Yes Laporche Martelle, Elita Quick, MD  Multiple Vitamin (MULTIVITAMIN) tablet Take 1 tablet by mouth daily.   Yes [provider]  topiramate (TOPAMAX) 25  MG tablet Take by mouth. Sisk 07/04/22  Yes [provider]  traZODone (DESYREL) 50 MG tablet Take 100 mg by mouth at bedtime. sisk   Yes [provider]  VITAMIN D PO Take 1 capsule by mouth 3 (three) times a week.   Yes [provider]    Allergies: Allergies  Allergen Reactions   Requip [Ropinirole]     Headache/ migraine   Sertraline     Talking to self and "doing things I didn't realize I was doing"   Vyvanse [Lisdexamfetamine]     Chewing lips and crazy  thoughts    Social History:  reports that she has quit smoking. Her smoking use included cigarettes. She has been exposed to tobacco smoke. She has never used smokeless tobacco. She reports current alcohol use. She reports that she does not currently use drugs.   Family History: Family History  Problem Relation Age of Onset   Healthy Mother    Healthy Father    Breast cancer Maternal Grandmother 67    Review of Systems: Review of Systems  Constitutional:  Negative for chills and fever.  HENT:  Negative for hearing loss.   Respiratory:  Negative for shortness of breath.   Cardiovascular:  Negative for chest pain.  Gastrointestinal:  Negative for abdominal pain, nausea and vomiting.  Genitourinary:  Negative for dysuria.  Musculoskeletal:  Negative for myalgias.  Skin:  Negative for rash.  Neurological:  Negative for dizziness.  Psychiatric/Behavioral:  Negative for depression.     Physical Exam BP (!) 157/86   Pulse 60   Temp 98 F (36.7 C)   Ht 5\' 4"  (1.626 m)   Wt 175 lb (79.4 kg)   LMP 08/15/2022 (Exact Date)   SpO2 98%   BMI 30.04 kg/m  CONSTITUTIONAL: No acute distress, well-nourished HEENT:  Normocephalic, atraumatic, extraocular motion intact. NECK: Trachea is midline, and there is no jugular venous distension.  RESPIRATORY:  Lungs are clear, and breath sounds are equal bilaterally. Normal respiratory effort without pathologic use of accessory muscles. CARDIOVASCULAR: Heart is regular without murmurs, gallops, or rubs. BREAST: Right breast with no palpable masses, skin changes, or nipple changes.  Currently no drainage from the right nipple.  Biopsy site is healing well with no evidence of bruising.  No right axillary lymphadenopathy.  Left breast without any palpable masses, skin changes, or nipple changes.  Also no drainage from the left nipple.  No left axillary lymphadenopathy. MUSCULOSKELETAL:  Normal muscle strength and tone in all four extremities.  No  peripheral edema or cyanosis. SKIN: Skin turgor is normal. There are no pathologic skin lesions.  NEUROLOGIC:  Motor and sensation is grossly normal.  Cranial nerves are grossly intact. PSYCH:  Alert and oriented to person, place and time. Affect is normal.  Laboratory Analysis: Labs from 05/05/2022: Sodium 141, potassium 4.6, chloride 104, CO2 22, BUN 10, creatinine 0.79.  Total bilirubin 0.3, AST 16, ALT 22, alkaline phosphatase 72, albumin 4.6.  WBC 7.3, hemoglobin 11.2, hematocrit 35.5, platelets 371.  Right breast biopsy on 08/12/2022: DIAGNOSIS:  A. BREAST, RIGHT 7:00 RETROAREOLAR; ULTRASOUND-GUIDED BIOPSY:  - INTRADUCTAL PAPILLOMA WITH FOCAL SCLEROSIS.  - NEGATIVE FOR ATYPIA AND MALIGNANCY.   Imaging: Mammogram and ultrasound on 08/05/2022: FINDINGS: MAMMOGRAM: RIGHT: There is a 6 mm oval circumscribed mass in the lower outer retroareolar right breast (CC image 67/83, spot MLO 1 of 4 image 43/65). There are associated punctate/round calcifications.   There is an additional 6 mm oval circumscribed mass in  the lower central right breast middle depth (CC image 72/83, MLO image 69/87).   There is a 30 mm focal asymmetry in the upper-outer right breast posterior depth that persists on spot tomosynthesis views but demonstrates interspersed focal fat (spot CC 2 of 2 image 55/91, spot MLO 2 of 2 image 60/83).   LEFT: Spot tomosynthesis views of the retroareolar left breast were obtained given history of nipple discharge. No suspicious findings are identified in this area.   No suspicious mass, calcification, or other findings are identified in the left breast.   ULTRASOUND: RIGHT: At 7 o'clock in the retroareolar position, there is an oval circumscribed hypoechoic intraductal mass with internal vascularity that measures 12 x 4 x 4 mm. There are few punctate echogenic foci within the mass. This corresponds with the retroareolar mass with associated calcifications seen on  mammogram.   At 7 o'clock 5 cm from the nipple, there is an oval circumscribed hypoechoic mass without internal vascularity. It measures 7 x 3 x 4 mm. This corresponds with the mass in the lower central right breast seen on mammogram.   Targeted right breast ultrasound at 10 o'clock 10 cm from nipple demonstrates normal fibroglandular tissue. No sonographic correlate for the focal asymmetry seen in the upper-outer quadrant is identified.   Targeted right axillary ultrasound demonstrates a morphologically benign lymph node. No lymphadenopathy.   LEFT: Targeted left retroareolar ultrasound was performed given history of nipple discharge. This demonstrates normal fibroglandular tissue. No suspicious solid or cystic mass or area of shadowing is identified.   IMPRESSION: 1. Right breast 7 mm intraductal mass in the 7 o'clock retroareolar position is low suspicion for malignancy and may be an etiology for right nipple discharge. Recommend further assessment with ultrasound-guided biopsy. 2. 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 3. Right breast focal asymmetry in the upper-outer quadrant, without a sonographic correlate, is probably benign and likely represents the patient's baseline configuration of fibroglandular tissue. 4. No mammographic or sonographic etiology for left nipple discharge is identified. No evidence of malignancy in the left breast.   RECOMMENDATION: 1. RIGHT breast ultrasound-guided biopsy of the mass in the 7 o'clock retroareolar position (1 site). 2. With benign results, recommend six-month follow-up RIGHT diagnostic mammogram and ultrasound to assess for stability of the probably benign mass in the 7 o'clock position 5 cm from the nipple and the focal asymmetry in the upper-outer quadrant. Biopsy results require surgical excision, consider biopsy  of these areas prior to surgery.   I have discussed the findings and recommendations with the patient. The biopsy procedure was discussed with the patient and questions were answered. Patient expressed their understanding of the biopsy recommendation. Patient will be scheduled for biopsy at her earliest convenience by the schedulers. Ordering provider will be notified. If applicable, a reminder letter will be sent to the patient regarding the next appointment.   BI-RADS CATEGORY  4: Suspicious.  Assessment and Plan: This is a 42 y.o. female with a right breast intraductal papilloma.  - Discussed with the patient the findings on her imaging studies as well as the biopsy results.  Overall she does have a retroareolar right breast intraductal mass consistent with a intraductal papilloma.  There is 2 other areas that potentially are benign in the right breast at the 7:00 and 10:00 positions.  Discussed with patient that intraductal papillomas are benign masses but she  would like to have the mass removed as a precaution.  I think this is reasonable for peace of mind to make sure there is no evidence of any other pathology.  Discussed with her that given the other 2 areas found on her right breast, radiology had recommended biopsy of these areas if we were to proceed with any excision as a precaution to make sure we do need to change our surgical plan for other excisions at the same time.  She is in agreement with this. - Discussed with her then the plan for biopsy of the 2 additional areas in the right breast as previously recommended.  Then also discussed with her the use of radiofrequency tag localization in order to localize the intraductal papilloma better and any potential other areas for excision.  This would then be followed by a tag localized lumpectomy in the right breast.  Discussed with her the surgery at length and reviewed with her the planned incision, risks of bleeding, infection, injury  to surrounding structures, that this would be an outpatient surgery, postoperative activity restrictions, pain control, and she is willing to proceed. - Tentatively, we will schedule her for surgery on 09/27/2022.  This may have to change based on timing of biopsies as well as RF tag placement.  I spent 55 minutes dedicated to the care of this patient on the date of this encounter to include pre-visit review of records, face-to-face time with the patient discussing diagnosis and management, and any post-visit coordination of care.   Howie Ill, MD [facility]

## 2022-08-29 NOTE — Patient Instructions (Addendum)
We will have you get the two additional biopsies done at [facility].  They will call you to schedule these.  We have spoken today about removing a lump in your breast. This will be done by Dr. Aleen Campi at [facility]  You will most likely be able to leave the hospital several hours after your surgery. Rarely, a patient needs to stay over night but this is a possibility.  Plan to tenatively be off work for 1-2 weeks following the surgery and may return with approximately 2 more weeks of a lifting restriction, no greater than 15 lbs.  Please see your Blue surgery sheet for more information. Our surgery scheduler will call you to look at surgery dates and to go over information.   What is radio frequency localization of the breast?(RFID) RFID tag localization uses radiofrequency technology to accurately pinpoint the tumor. Seeing exactly where the tumor is before surgery helps surgeons more effectively remove the entire tumor and spare surrounding healthy breast tissue.    Lumpectomy A lumpectomy is a form of "breast conserving" or "breast preservation" surgery. It may also be referred to as a partial mastectomy. During a lumpectomy, the portion of the breast that contains the cancerous tumor or breast mass (the lump) is removed. Some normal tissue around the lump may also be removed to make sure all of the tumor has been removed.  LET [facility] CARE PROVIDER KNOW ABOUT: Any allergies you have. All medicines you are taking, including vitamins, herbs, eye drops, creams, and over-the-counter medicines. Previous problems you or members of your family have had with the use of anesthetics. Any blood disorders you have. Previous surgeries you have had. Medical conditions you have. RISKS AND COMPLICATIONS Generally, this is a safe procedure. However, problems can occur and include: Bleeding. Infection. Pain. Temporary swelling. Change in the shape of the breast, particularly if a large portion is  removed. BEFORE THE PROCEDURE Ask your health care provider about changing or stopping your regular medicines. This is especially important if you are taking diabetes medicines or blood thinners. Do not eat or drink anything after midnight on the night before the procedure or as directed by your health care provider. Ask your health care provider if you can take a sip of water with any approved medicines. On the day of surgery, your health care provider will use a mammogram or ultrasound to locate and mark the tumor in your breast. These markings on your breast will show where the cut (incision) will be made. PROCEDURE  An IV tube will be put into one of your veins. You may be given medicine to help you relax before the surgery (sedative). You will be given one of the following: A medicine that numbs the area (local anesthetic). A medicine that makes you fall asleep (general anesthetic). Your health care provider will use a kind of electric scalpel that uses heat to minimize bleeding (electrocautery knife). A curved incision (like a smile or frown) that follows the natural curve of your breast is made, to allow for minimal scarring and better healing. The tumor will be removed with some of the surrounding tissue. This will be sent to the lab for analysis. Your health care provider may also remove your lymph nodes at this time if needed. Sometimes, but not always, a rubber tube called a drain will be surgically inserted into your breast area or armpit to collect excess fluid that may accumulate in the space where the tumor was. This drain is connected to  a plastic bulb on the outside of your body. This drain creates suction to help remove the fluid. The incisions will be closed with stitches (sutures). A bandage may be placed over the incisions. AFTER THE PROCEDURE You will be taken to the recovery area. You will be given medicine for pain. A small rubber drain may be placed in the breast for 2-3  days to prevent a collection of blood (hematoma) from developing in the breast. You will be given instructions on caring for the drain before you go home. A pressure bandage (dressing) will be applied for 1-2 days to prevent bleeding. Ask your health care provider how to care for your bandage at home.   This information is not intended to replace advice given to you by your health care provider. Make sure you discuss any questions you have with your health care provider.   Document Released: 04/11/2006 Document Revised: 03/21/2014 Document Reviewed: 08/03/2012 Elsevier Interactive Patient Education [employer]

## 2022-08-30 ENCOUNTER — Telehealth: Payer: Self-pay | Admitting: Surgery

## 2022-08-30 NOTE — Telephone Encounter (Signed)
Patient has been advised of Pre-Admission date/time, and Surgery date at [facility]  Surgery Date: 09/27/22 Preadmission Testing Date: 09/16/22 (phone 1p-4p)  Patient has been made aware to call [phone], between 1-3:00pm the day before surgery, to find out what time to arrive for surgery.    Patient aware that RF tag placement is pending scheduling at this time.  She is scheduled for breast biopsy on 09/02/22.  Once final path report is in, tag will be scheduled.

## 2022-09-02 ENCOUNTER — Ambulatory Visit
Admission: RE | Admit: 2022-09-02 | Discharge: 2022-09-02 | Disposition: A | Discharge status: Home / Self Care | Payer: Medicaid Other | Source: Ambulatory Visit | Attending: Surgery | Admitting: Surgery

## 2022-09-02 ENCOUNTER — Other Ambulatory Visit: Payer: Medicaid Other

## 2022-09-02 ENCOUNTER — Other Ambulatory Visit: Payer: Self-pay | Admitting: Surgery

## 2022-09-02 DIAGNOSIS — N63 Unspecified lump in unspecified breast: Secondary | ICD-10-CM | POA: Insufficient documentation

## 2022-09-02 DIAGNOSIS — R928 Other abnormal and inconclusive findings on diagnostic imaging of breast: Secondary | ICD-10-CM | POA: Diagnosis not present

## 2022-09-02 DIAGNOSIS — D241 Benign neoplasm of right breast: Secondary | ICD-10-CM

## 2022-09-02 DIAGNOSIS — N6313 Unspecified lump in the right breast, lower outer quadrant: Secondary | ICD-10-CM | POA: Diagnosis not present

## 2022-09-02 HISTORY — PX: BREAST BIOPSY: SHX20

## 2022-09-02 MED ORDER — LIDOCAINE HCL 1 % IJ SOLN
2.0000 mL | Freq: Once | INTRAMUSCULAR | Status: AC
  Administered 2022-09-02: 2 mL via INTRADERMAL
  Filled 2022-09-02: qty 2

## 2022-09-02 MED ORDER — LIDOCAINE HCL 1 % IJ SOLN
15.0000 mL | Freq: Once | INTRAMUSCULAR | Status: AC
  Administered 2022-09-02: 15 mL
  Filled 2022-09-02: qty 15

## 2022-09-02 MED ORDER — LIDOCAINE-EPINEPHRINE 1 %-1:100000 IJ SOLN
5.0000 mL | Freq: Once | INTRAMUSCULAR | Status: AC
  Administered 2022-09-02: 5 mL
  Filled 2022-09-02: qty 5

## 2022-09-02 MED ORDER — LIDOCAINE HCL 1 % IJ SOLN
5.0000 mL | Freq: Once | INTRAMUSCULAR | Status: AC
  Administered 2022-09-02: 5 mL
  Filled 2022-09-02: qty 5

## 2022-09-02 MED ORDER — LIDOCAINE-EPINEPHRINE 1 %-1:100000 IJ SOLN
10.0000 mL | Freq: Once | INTRAMUSCULAR | Status: AC
  Administered 2022-09-02: 10 mL
  Filled 2022-09-02: qty 10

## 2022-09-06 LAB — SURGICAL PATHOLOGY

## 2022-09-08 ENCOUNTER — Other Ambulatory Visit: Payer: Self-pay | Admitting: Surgery

## 2022-09-08 DIAGNOSIS — R928 Other abnormal and inconclusive findings on diagnostic imaging of breast: Secondary | ICD-10-CM

## 2022-09-09 ENCOUNTER — Other Ambulatory Visit: Payer: Medicaid Other

## 2022-09-13 DIAGNOSIS — F4310 Post-traumatic stress disorder, unspecified: Secondary | ICD-10-CM | POA: Diagnosis not present

## 2022-09-13 DIAGNOSIS — F411 Generalized anxiety disorder: Secondary | ICD-10-CM | POA: Diagnosis not present

## 2022-09-13 DIAGNOSIS — F331 Major depressive disorder, recurrent, moderate: Secondary | ICD-10-CM | POA: Diagnosis not present

## 2022-09-16 ENCOUNTER — Inpatient Hospital Stay: Admission: RE | Admit: 2022-09-16 | Payer: Medicaid Other | Source: Ambulatory Visit

## 2022-09-16 HISTORY — DX: Umbilical hernia without obstruction or gangrene: K42.9

## 2022-09-16 HISTORY — DX: Calculus of gallbladder without cholecystitis without obstruction: K80.20

## 2022-09-16 HISTORY — DX: Deficiency of other specified B group vitamins: E53.8

## 2022-09-16 HISTORY — DX: Abnormal level of blood mineral: R79.0

## 2022-09-16 HISTORY — DX: Paresthesia of skin: R20.0

## 2022-09-16 HISTORY — DX: Paresthesia of skin: R20.2

## 2022-09-19 ENCOUNTER — Encounter
Admission: RE | Admit: 2022-09-19 | Discharge: 2022-09-19 | Disposition: A | Discharge status: Home / Self Care | Payer: Medicaid Other | Source: Ambulatory Visit | Attending: Surgery | Admitting: Surgery

## 2022-09-19 ENCOUNTER — Ambulatory Visit
Admission: RE | Admit: 2022-09-19 | Discharge: 2022-09-19 | Disposition: A | Discharge status: Home / Self Care | Payer: Medicaid Other | Source: Ambulatory Visit | Attending: Surgery | Admitting: Surgery

## 2022-09-19 VITALS — Ht 64.0 in | Wt 175.0 lb

## 2022-09-19 DIAGNOSIS — R928 Other abnormal and inconclusive findings on diagnostic imaging of breast: Secondary | ICD-10-CM

## 2022-09-19 DIAGNOSIS — D241 Benign neoplasm of right breast: Secondary | ICD-10-CM | POA: Diagnosis not present

## 2022-09-19 DIAGNOSIS — Z01818 Encounter for other preprocedural examination: Secondary | ICD-10-CM

## 2022-09-19 HISTORY — PX: BREAST BIOPSY: SHX20

## 2022-09-19 HISTORY — DX: Anxiety disorder, unspecified: F41.9

## 2022-09-19 HISTORY — DX: Benign neoplasm, unspecified site: D36.9

## 2022-09-19 HISTORY — PX: BREAST LUMPECTOMY WITH RADIOFREQUENCY TAG IDENTIFICATION: SHX6884

## 2022-09-19 HISTORY — DX: Gastro-esophageal reflux disease without esophagitis: K21.9

## 2022-09-19 MED ORDER — LIDOCAINE HCL (PF) 1 % IJ SOLN
5.0000 mL | Freq: Once | INTRAMUSCULAR | Status: AC
  Administered 2022-09-19: 5 mL
  Filled 2022-09-19: qty 5

## 2022-09-19 MED ORDER — LIDOCAINE-EPINEPHRINE 1 %-1:100000 IJ SOLN
10.0000 mL | Freq: Once | INTRAMUSCULAR | Status: AC
  Administered 2022-09-19: 10 mL
  Filled 2022-09-19: qty 10

## 2022-09-19 MED ORDER — LIDOCAINE 1 % OPTIME INJ - NO CHARGE
5.0000 mL | Freq: Once | INTRAMUSCULAR | Status: AC
  Administered 2022-09-19: 5 mL
  Filled 2022-09-19: qty 6

## 2022-09-19 NOTE — Patient Instructions (Signed)
Your procedure is scheduled on:09-27-22 Tuesday Report to the Registration Desk on the 1st floor of the Medical [facility].Then proceed to the 2nd floor Surgery Desk To find out your arrival time, please call [phone] between 1PM - 3PM on:09-26-22 Monday If your arrival time is 6:00 am, do not arrive before that time as the Medical Mall entrance doors do not open until 6:00 am.  REMEMBER: Instructions that are not followed completely may result in serious medical risk, up to and including death; or upon the discretion of your surgeon and anesthesiologist your surgery may need to be rescheduled.  Do not eat food after midnight the night before surgery.  No gum chewing or hard candies.  You may however, drink CLEAR liquids up to 2 hours before you are scheduled to arrive for your surgery. Do not drink anything within 2 hours of your scheduled arrival time.  Clear liquids include: - water  - apple juice without pulp - gatorade (not RED colors) - black coffee or tea (Do NOT add milk or creamers to the coffee or tea) Do NOT drink anything that is not on this list.  One week prior to surgery:Last dose today 09-19-22 Stop Anti-inflammatories (NSAIDS) such as Advil, Aleve, Ibuprofen, Motrin, Naproxen, Naprosyn and Aspirin based products such as Excedrin, Goody's Powder, BC Powder.You may however, take Tylenol if needed for pain up until the day of surgery. Stop ANY OVER THE COUNTER supplements/vitamins NOW (09-19-22) until after surgery (multivitamin, vitamin B12 and vitamin D)  Continue taking all prescribed medications  TAKE ONLY THESE MEDICATIONS THE MORNING OF SURGERY WITH A SIP OF WATER: -escitalopram (LEXAPRO)  -you may take ALPRAZolam Prudy Feeler) if needed for anxiety  No Alcohol for 24 hours before or after surgery.  No Smoking including e-cigarettes for 24 hours before surgery.  No chewable tobacco products for at least 6 hours before surgery.  No nicotine patches on the day of  surgery.  Do not use any "recreational" drugs for at least a week (preferably 2 weeks) before your surgery.  Please be advised that the combination of cocaine and anesthesia may have negative outcomes, up to and including death. If you test positive for cocaine, your surgery will be cancelled.  On the morning of surgery brush your teeth with toothpaste and water, you may rinse your mouth with mouthwash if you wish. Do not swallow any toothpaste or mouthwash.  Do not wear jewelry, make-up, hairpins, clips or nail polish.  Do not wear lotions, powders, or perfumes.   Do not shave body hair from the neck down 48 hours before surgery.  Contact lenses, hearing aids and dentures may not be worn into surgery.  Do not bring valuables to the hospital. [facility] is not responsible for any missing/lost belongings or valuables.   Notify your doctor if there is any change in your medical condition (cold, fever, infection).  Wear comfortable clothing (specific to your surgery type) to the hospital.  After surgery, you can help prevent lung complications by doing breathing exercises.  Take deep breaths and cough every 1-2 hours. Your doctor may order a device called an Incentive Spirometer to help you take deep breaths. When coughing or sneezing, hold a pillow firmly against your incision with both hands. This is called "splinting." Doing this helps protect your incision. It also decreases belly discomfort.  If you are being admitted to the hospital overnight, leave your suitcase in the car. After surgery it may be brought to your room.  In case  of increased patient census, it may be necessary for you, the patient, to continue your postoperative care in the Same Day Surgery department.  If you are being discharged the day of surgery, you will not be allowed to drive home. You will need a responsible individual to drive you home and stay with you for 24 hours after surgery.   If you are taking  public transportation, you will need to have a responsible individual with you.  Please call the Pre-admissions Testing Dept. at [phone] if you have any questions about these instructions.  Surgery Visitation Policy:  Patients having surgery or a procedure may have two visitors.  Children under the age of 53 must have an adult with them who is not the patient.

## 2022-09-21 ENCOUNTER — Encounter: Payer: Self-pay | Admitting: Family Medicine

## 2022-09-22 ENCOUNTER — Encounter: Payer: Self-pay | Admitting: Family Medicine

## 2022-09-22 ENCOUNTER — Ambulatory Visit (INDEPENDENT_AMBULATORY_CARE_PROVIDER_SITE_OTHER): Payer: Medicaid Other | Admitting: Family Medicine

## 2022-09-22 VITALS — BP 110/76 | HR 84 | Ht 64.0 in | Wt 183.0 lb

## 2022-09-22 DIAGNOSIS — K449 Diaphragmatic hernia without obstruction or gangrene: Secondary | ICD-10-CM | POA: Diagnosis not present

## 2022-09-22 DIAGNOSIS — K219 Gastro-esophageal reflux disease without esophagitis: Secondary | ICD-10-CM | POA: Diagnosis not present

## 2022-09-22 DIAGNOSIS — M7712 Lateral epicondylitis, left elbow: Secondary | ICD-10-CM | POA: Diagnosis not present

## 2022-09-22 NOTE — Patient Instructions (Signed)
Tennis Elbow  Tennis elbow (lateral epicondylitis) is inflammation of tendons in your outer forearm, near your elbow. Tendons are tissues that connect muscle to bone. When you have tennis elbow, inflammation affects the tendons that you use to bend your wrist and move your hand up. Inflammation occurs in the lower part of the upper arm bone (humerus), where the tendons connect to the bone (lateral epicondyle). Tennis elbow often affects people who play tennis, but anyone may get the condition from repeatedly extending the wrist or turning the forearm. What are the causes? This condition is usually caused by repeatedly extending the wrist, turning the forearm, and using the hands. It can result from sports or work that requires repetitive forearm movements. In some cases, it may be caused by a sudden injury. What increases the risk? You are more likely to develop tennis elbow if you play tennis or another racket sport. You also have a higher risk if you frequently use your hands for work. Besides people who play tennis, others at greater risk include: People who use computers. Construction workers. People who work in factories. Musicians. Cooks. Cashiers. What are the signs or symptoms? Symptoms of this condition include: Pain and tenderness in the forearm and the outer part of the elbow. Pain may be felt only when using the arm, or it may be there all the time. A burning feeling that starts in the elbow and spreads down the forearm. A weak grip in the hand. How is this diagnosed? This condition is diagnosed based on your symptoms, your medical history, and a physical exam. You may also have X-rays or an MRI to: Confirm the diagnosis. Look for other issues. Check for tears in the ligaments, muscles, or tendons. How is this treated? Resting and icing your arm is often the first treatment. Your health care provider may also recommend: Medicines to reduce pain and inflammation. These may be in  the form of a pill, topical gels, or shots of a steroid medicine (cortisone). An elbow strap to reduce stress on the area. Physical therapy. This may include massage or exercises or both. An elbow brace to restrict the movements that cause symptoms. If these treatments do not help relieve your symptoms, your health care provider may recommend surgery to remove damaged muscle and reattach healthy muscle to bone. Follow these instructions at home: If you have a brace or strap: Wear the brace or strap as told by your health care provider. Remove it only as told by your health care provider. Check the skin around the brace or strap every day. Tell your health care provider about any concerns. Loosen the brace if your fingers tingle, become numb, or turn cold and blue. Keep the brace clean. If the brace or strap is not waterproof: Do not let it get wet. Cover it with a watertight covering when you take a bath or a shower. Managing pain, stiffness, and swelling  If directed, put ice on the injured area. To do this: If you have a removable brace or strap, remove it as told by your health care provider. Put ice in a plastic bag. Place a towel between your skin and the bag. Leave the ice on for 20 minutes, 2-3 times a day. Remove the ice if your skin turns bright red. This is very important. If you cannot feel pain, heat, or cold, you have a greater risk of damage to the area. Move your fingers often to reduce stiffness and swelling. Activity Rest your elbow   and wrist and avoid activities that cause symptoms as told by your health care provider. Do physical therapy exercises as told by your health care provider. If you lift an object, lift it with your palm facing up. This reduces stress on your elbow. Lifestyle If your tennis elbow is caused by sports, check your equipment and make sure that: You use it correctly. It is good match for you. If your tennis elbow is caused by work or computer  use, take frequent breaks to stretch your arm. Talk with your employer about ways to manage your condition at work. General instructions Take over-the-counter and prescription medicines only as told by your health care provider. Do not use any products that contain nicotine or tobacco. These products include cigarettes, chewing tobacco, and vaping devices, such as e-cigarettes. If you need help quitting, ask your health care provider. Keep all follow-up visits. This is important. How is this prevented? Before and after activity: Warm up and stretch before being active. Cool down and stretch after being active. Give your body time to rest between periods of activity. During activity: Make sure to use equipment that fits you. If you play tennis, put power in your stroke with your lower body. Avoid using your arm only. Maintain physical fitness, including: Strength. Flexibility. Endurance. Do exercises to strengthen the forearm muscles. Contact a health care provider if: You have pain that gets worse or does not get better with treatment. You have numbness or weakness in your forearm, hand, or fingers. Get help right away if: Your pain is severe. You cannot move your wrist. Summary Tennis elbow (lateral epicondylitis) is inflammation of tendons in your outer forearm, near your elbow. Common symptoms include pain and tenderness in your forearm and the outer part of your elbow. This condition is usually caused by repeatedly extending your wrist, turning your forearm, and using your hands. The first treatment is often resting and icing your arm to relieve symptoms. Further treatment may include taking medicine, getting physical therapy, wearing a brace or strap, or having surgery. This information is not intended to replace advice given to you by your health care provider. Make sure you discuss any questions you have with your health care provider. Document Revised: 09/10/2019 Document  Reviewed: 09/10/2019 Elsevier Patient Education  2024 [employer] Inc.  

## 2022-09-22 NOTE — Progress Notes (Signed)
Date:  09/22/2022   Name:  Lauren Boyd   DOB:  1980/09/01   MRN:  [id number]   Chief Complaint: elnow pain (L) elbow pain- hurts to pick up water bottle or cell phone. Does not happen every time, but is occurring more frequent.)  Arm Pain  The incident occurred more than 1 week ago. There was no injury mechanism. The pain is present in the left elbow. The quality of the pain is described as aching. The pain is moderate. The pain has been Constant since the incident. Pertinent negatives include no chest pain. She has tried nothing for the symptoms.    Lab Results  Component Value Date   NA 140 12/02/2018   K 4.0 12/02/2018   CO2 24 12/02/2018   GLUCOSE 101 (H) 12/02/2018   BUN 25 (H) 12/02/2018   CREATININE 0.83 12/02/2018   CALCIUM 9.3 12/02/2018   GFRNONAA >60 12/02/2018   Lab Results  Component Value Date   CHOL 178 07/22/2022   HDL 63 07/22/2022   LDLCALC 98 07/22/2022   TRIG 91 07/22/2022   CHOLHDL 2.0 11/30/2018   Lab Results  Component Value Date   TSH 1.144 01/07/2018   No results found for: "HGBA1C" Lab Results  Component Value Date   WBC 6.8 11/21/2019   HGB 12.4 11/21/2019   HCT 37.4 11/21/2019   MCV 88.8 11/21/2019   PLT 271 11/21/2019   Lab Results  Component Value Date   ALT 16 12/02/2018   AST 17 12/02/2018   ALKPHOS 52 12/02/2018   BILITOT 0.5 12/02/2018   No results found for: "25OHVITD2", "25OHVITD3", "VD25OH"   Review of Systems  Constitutional:  Negative for fever.  Respiratory:  Negative for chest tightness, shortness of breath and wheezing.   Cardiovascular:  Negative for chest pain and palpitations.  Gastrointestinal:  Negative for abdominal pain.  Musculoskeletal:  Negative for joint swelling.    Patient Active Problem List   Diagnosis Date Noted   Recurrent umbilical hernia    Low ferritin 08/02/2019   B12 deficiency 08/02/2019   Difficulty walking 07/18/2019   Numbness and tingling of left side of face 07/18/2019    Calculus of gallbladder without cholecystitis without obstruction 08/02/2016    Allergies  Allergen Reactions   Requip [Ropinirole]     Headache/ migraine   Sertraline     Talking to self and "doing things I didn't realize I was doing"   Vyvanse [Lisdexamfetamine]     Chewing lips and crazy thoughts    Past Surgical History:  Procedure Laterality Date   BREAST BIOPSY Right 08/12/2022   u/s guided bx, mass, COIL clip-INTRADUCTAL PAPILLOMA WITH FOCAL SCLEROSIS   BREAST BIOPSY Right 09/02/2022   stereo bx, asymmetry,X Clip X-CLIP) - PSEUDOANGIOMATOUS STROMAL HYPERPLASIA, STROMAL FIBROSIS, AND COLUMNAR CELL CHANGE. - NEGATIVE FOR ATYPIA AND MALIGNANCY   BREAST BIOPSY Right 09/02/2022   u/s bx, mass, RIBBON  NTRADUCTAL PAPILLOMA WITH FOCAL EPITHELIAL ATYPIA AND SCLEROSIS. Comment: IHC for CK5/6 and ER was performed on part A. The atypical epithelial cells are negative for CK5/6 with retained ER expression. 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   BREAST BIOPSY Right 09/02/2022   [place] RT BREAST BX W LOC DEV 1ST LESION IMG BX SPEC US GUIDE 09/02/2022 [facility]   BREAST BIOPSY Right 09/02/2022   MM RT BREAST BX W LOC DEV 1ST LESION IMAGE BX SPEC STEREO GUIDE 09/02/2022 [facility]   BREAST BIOPSY Right 09/19/2022   MM RT RADIO  FREQUENCY TAG LOC MAMMO GUIDE 09/19/2022 [facility]   BREAST BIOPSY Right 09/19/2022   MM RT RADIO FREQUENCY TAG EA ADD LESION LOC MAMMO GUIDE 09/19/2022 [facility]   BREAST LUMPECTOMY WITH RADIOFREQUENCY TAG IDENTIFICATION Right 09/19/2022   2 savi scout place prior to  surgery 09/27/22   CHOLECYSTECTOMY  2018   HERNIA REPAIR  2014   Umbical   INSERTION OF MESH  02/09/2021   Procedure: INSERTION OF MESH;  Surgeon: Henrene Dodge, MD;  Location: [facility];  Service: General;;   TUBAL LIGATION      Social History   Tobacco Use   Smoking status: Former    Current packs/day: 0.00    Types: Cigarettes    Quit date: 2010     Years since quitting: 14.5    Passive exposure: Past   Smokeless tobacco: Never  Vaping Use   Vaping status: Never Used  Substance Use Topics   Alcohol use: Yes    Comment: occasional   Drug use: Never     Medication list has been reviewed and updated.  Current Meds  Medication Sig   acetaminophen (TYLENOL) 500 MG tablet Take 2 tablets (1,000 mg total) by mouth every 6 (six) hours as needed for mild pain.   cyanocobalamin 1000 MCG tablet Take 1,000 mcg by mouth daily.   escitalopram (LEXAPRO) 20 MG tablet Take 20 mg by mouth every morning. [name]   ibuprofen (ADVIL) 800 MG tablet Take 1 tablet (800 mg total) by mouth every 8 (eight) hours as needed for moderate pain.   metFORMIN (GLUCOPHAGE) 500 MG tablet Take 500 mg by mouth daily.   Multiple Vitamin (MULTIVITAMIN) tablet Take 1 tablet by mouth daily.   VITAMIN D PO Take 1 capsule by mouth 3 (three) times a week.       09/22/2022    2:47 PM 07/22/2022    8:51 AM 05/20/2022   10:13 AM 03/24/2022    1:39 PM  GAD 7 : Generalized Anxiety Score  Nervous, Anxious, on Edge 0 0 0 0  Control/stop worrying 0 0 0 0  Worry too much - different things 0 0 0 0  Trouble relaxing 0 0 0 0  Restless 0 0 0 0  Easily annoyed or irritable 0 0 0 0  Afraid - awful might happen 0 0 0 0  Total GAD 7 Score 0 0 0 0  Anxiety Difficulty Not difficult at all Not difficult at all Not difficult at all Not difficult at all       09/22/2022    2:47 PM 07/22/2022    8:51 AM 05/20/2022   10:12 AM  Depression screen PHQ 2/9  Decreased Interest 0 0 0  Down, Depressed, Hopeless 0 0 0  PHQ - 2 Score 0 0 0  Altered sleeping 0 0 0  Tired, decreased energy 0 0 0  Change in appetite 0 0 0  Feeling bad or failure about yourself  0 0 0  Trouble concentrating 0 0 0  Moving slowly or fidgety/restless 0 0 0  Suicidal thoughts 0 0 0  PHQ-9 Score 0 0 0  Difficult doing work/chores Not difficult at all Not difficult at all Not difficult at all    BP  Readings from Last 3 Encounters:  09/22/22 110/76  08/29/22 (!) 157/86  07/22/22 110/78    Physical Exam Vitals and nursing note reviewed. Exam conducted with a chaperone present.  Constitutional:      General: She is not in acute  distress.    Appearance: She is not diaphoretic.  HENT:     Head: Normocephalic and atraumatic.     Right Ear: Tympanic membrane, ear canal and external ear normal.     Left Ear: Tympanic membrane, ear canal and external ear normal.     Nose: Nose normal.  Eyes:     General:        Right eye: No discharge.        Left eye: No discharge.     Conjunctiva/sclera: Conjunctivae normal.     Pupils: Pupils are equal, round, and reactive to light.  Neck:     Thyroid: No thyromegaly.     Vascular: No JVD.  Cardiovascular:     Rate and Rhythm: Normal rate and regular rhythm.     Heart sounds: Normal heart sounds. No murmur heard.    No friction rub. No gallop.  Pulmonary:     Effort: Pulmonary effort is normal.     Breath sounds: Normal breath sounds.  Abdominal:     General: Bowel sounds are normal.     Palpations: Abdomen is soft. There is no mass.     Tenderness: There is no abdominal tenderness. There is no guarding.  Musculoskeletal:        General: Normal range of motion.     Left elbow: Tenderness present in lateral epicondyle.     Cervical back: Normal range of motion and neck supple.  Lymphadenopathy:     Cervical: No cervical adenopathy.  Skin:    General: Skin is warm and dry.  Neurological:     Mental Status: She is alert.     Wt Readings from Last 3 Encounters:  09/22/22 183 lb (83 kg)  09/19/22 175 lb 0.7 oz (79.4 kg)  08/29/22 175 lb (79.4 kg)    BP 110/76   Pulse 84   Ht 5\' 4"  (1.626 m)   Wt 183 lb (83 kg)   LMP 09/07/2022 (Approximate)   SpO2 99%   BMI 31.41 kg/m   Assessment and Plan: 1. Lateral epicondylitis of left elbow New onset.  Persistent for over weeks.  Actually pain is occurring when patient is having to  dorsiflex the wrist and over the pick up things and with resistance dorsiflexion patient has pain of the lateral epicondyle of the left.  This is consistent with lateral epicondylitis and since she has upcoming surgery and limits [place] being able to use any medications from an oral standpoint we will approach this conservatively: 1 ice massage with an ice cube for 2 minutes every 6-8 hours 2.  Tennis elbow strap from the local pharmacy and 3.  Voltaren gel pea-sized right on the lateral epicondyles every 6-8 hours as well.  If continued symptomatology patient is to call and we will get him in with sports medicine at which time we will then do the x-ray since she has not had any falls or injury of note over the past week or 2 and consider of ultrasound or other means may be appropriate.    Elizabeth Sauer, MD

## 2022-09-27 ENCOUNTER — Encounter
Admission: RE | Disposition: A | Discharge status: Home / Self Care | Payer: Self-pay | Source: Home / Self Care | Attending: Surgery

## 2022-09-27 ENCOUNTER — Ambulatory Visit: Payer: Medicaid Other | Admitting: Registered Nurse

## 2022-09-27 ENCOUNTER — Encounter: Payer: Self-pay | Admitting: Surgery

## 2022-09-27 ENCOUNTER — Ambulatory Visit
Admission: RE | Admit: 2022-09-27 | Discharge: 2022-09-27 | Disposition: A | Discharge status: Home / Self Care | Payer: Medicaid Other | Source: Ambulatory Visit | Attending: Surgery | Admitting: Surgery

## 2022-09-27 ENCOUNTER — Ambulatory Visit
Admission: RE | Admit: 2022-09-27 | Discharge: 2022-09-27 | Disposition: A | Discharge status: Home / Self Care | Payer: Medicaid Other | Attending: Surgery | Admitting: Surgery

## 2022-09-27 ENCOUNTER — Other Ambulatory Visit: Payer: Self-pay

## 2022-09-27 DIAGNOSIS — N6341 Unspecified lump in right breast, subareolar: Secondary | ICD-10-CM | POA: Insufficient documentation

## 2022-09-27 DIAGNOSIS — R928 Other abnormal and inconclusive findings on diagnostic imaging of breast: Secondary | ICD-10-CM

## 2022-09-27 DIAGNOSIS — Z09 Encounter for follow-up examination after completed treatment for conditions other than malignant neoplasm: Secondary | ICD-10-CM | POA: Insufficient documentation

## 2022-09-27 DIAGNOSIS — Z8616 Personal history of COVID-19: Secondary | ICD-10-CM | POA: Diagnosis not present

## 2022-09-27 DIAGNOSIS — N6489 Other specified disorders of breast: Secondary | ICD-10-CM | POA: Diagnosis not present

## 2022-09-27 DIAGNOSIS — N6081 Other benign mammary dysplasias of right breast: Secondary | ICD-10-CM | POA: Diagnosis not present

## 2022-09-27 DIAGNOSIS — D241 Benign neoplasm of right breast: Secondary | ICD-10-CM | POA: Diagnosis not present

## 2022-09-27 DIAGNOSIS — N649 Disorder of breast, unspecified: Secondary | ICD-10-CM | POA: Diagnosis not present

## 2022-09-27 DIAGNOSIS — N6021 Fibroadenosis of right breast: Secondary | ICD-10-CM | POA: Insufficient documentation

## 2022-09-27 DIAGNOSIS — Z87891 Personal history of nicotine dependence: Secondary | ICD-10-CM | POA: Diagnosis not present

## 2022-09-27 DIAGNOSIS — N6452 Nipple discharge: Secondary | ICD-10-CM | POA: Insufficient documentation

## 2022-09-27 DIAGNOSIS — D249 Benign neoplasm of unspecified breast: Secondary | ICD-10-CM

## 2022-09-27 DIAGNOSIS — Z01818 Encounter for other preprocedural examination: Secondary | ICD-10-CM

## 2022-09-27 HISTORY — PX: BREAST LUMPECTOMY WITH RADIOFREQUENCY TAG IDENTIFICATION: SHX6884

## 2022-09-27 LAB — POCT PREGNANCY, URINE: Preg Test, Ur: NEGATIVE

## 2022-09-27 SURGERY — BREAST LUMPECTOMY WITH RADIOFREQUENCY TAG IDENTIFICATION
Anesthesia: General | Site: Breast | Laterality: Right

## 2022-09-27 MED ORDER — MIDAZOLAM HCL 2 MG/2ML IJ SOLN
INTRAMUSCULAR | Status: DC | PRN
  Administered 2022-09-27: 2 mg via INTRAVENOUS

## 2022-09-27 MED ORDER — KETOROLAC TROMETHAMINE 30 MG/ML IJ SOLN
INTRAMUSCULAR | Status: AC
  Filled 2022-09-27: qty 1

## 2022-09-27 MED ORDER — FAMOTIDINE 20 MG PO TABS
20.0000 mg | ORAL_TABLET | Freq: Once | ORAL | Status: AC
  Administered 2022-09-27: 20 mg via ORAL

## 2022-09-27 MED ORDER — BUPIVACAINE LIPOSOME 1.3 % IJ SUSP
INTRAMUSCULAR | Status: AC
  Filled 2022-09-27: qty 20

## 2022-09-27 MED ORDER — PROPOFOL 10 MG/ML IV BOLUS
INTRAVENOUS | Status: AC
  Filled 2022-09-27: qty 20

## 2022-09-27 MED ORDER — DEXAMETHASONE SODIUM PHOSPHATE 10 MG/ML IJ SOLN
INTRAMUSCULAR | Status: DC | PRN
  Administered 2022-09-27: 10 mg via INTRAVENOUS

## 2022-09-27 MED ORDER — BUPIVACAINE LIPOSOME 1.3 % IJ SUSP: 20.0000 mL | Freq: Once | INTRAMUSCULAR | Status: DC

## 2022-09-27 MED ORDER — OXYCODONE HCL 5 MG PO TABS: 5.0000 mg | ORAL_TABLET | ORAL | 0 refills | Status: DC | PRN

## 2022-09-27 MED ORDER — MIDAZOLAM HCL 2 MG/2ML IJ SOLN
INTRAMUSCULAR | Status: AC
  Filled 2022-09-27: qty 2

## 2022-09-27 MED ORDER — DROPERIDOL 2.5 MG/ML IJ SOLN: 0.6250 mg | Freq: Once | INTRAMUSCULAR | Status: DC | PRN

## 2022-09-27 MED ORDER — LACTATED RINGERS IV SOLN: INTRAVENOUS | Status: DC

## 2022-09-27 MED ORDER — EPHEDRINE 5 MG/ML INJ
INTRAVENOUS | Status: AC
  Filled 2022-09-27: qty 5

## 2022-09-27 MED ORDER — OXYCODONE HCL 5 MG PO TABS
5.0000 mg | ORAL_TABLET | Freq: Once | ORAL | Status: AC | PRN
  Administered 2022-09-27: 5 mg via ORAL

## 2022-09-27 MED ORDER — PHENYLEPHRINE 80 MCG/ML (10ML) SYRINGE FOR IV PUSH (FOR BLOOD PRESSURE SUPPORT)
PREFILLED_SYRINGE | INTRAVENOUS | Status: DC | PRN
  Administered 2022-09-27 (×2): 80 ug via INTRAVENOUS

## 2022-09-27 MED ORDER — CHLORHEXIDINE GLUCONATE 0.12 % MT SOLN
15.0000 mL | Freq: Once | OROMUCOSAL | Status: AC
  Administered 2022-09-27: 15 mL via OROMUCOSAL

## 2022-09-27 MED ORDER — ONDANSETRON HCL 4 MG/2ML IJ SOLN
4.0000 mg | Freq: Once | INTRAMUSCULAR | Status: AC | PRN
  Administered 2022-09-27: 4 mg via INTRAVENOUS

## 2022-09-27 MED ORDER — DEXMEDETOMIDINE HCL IN NACL 80 MCG/20ML IV SOLN
INTRAVENOUS | Status: DC | PRN
  Administered 2022-09-27 (×2): 8 ug via INTRAVENOUS
  Administered 2022-09-27: 4 ug via INTRAVENOUS

## 2022-09-27 MED ORDER — ONDANSETRON HCL 4 MG/2ML IJ SOLN
INTRAMUSCULAR | Status: AC
  Filled 2022-09-27: qty 2

## 2022-09-27 MED ORDER — ONDANSETRON HCL 4 MG/2ML IJ SOLN
INTRAMUSCULAR | Status: DC | PRN
  Administered 2022-09-27: 4 mg via INTRAVENOUS

## 2022-09-27 MED ORDER — FENTANYL CITRATE (PF) 100 MCG/2ML IJ SOLN
INTRAMUSCULAR | Status: DC | PRN
  Administered 2022-09-27 (×2): 50 ug via INTRAVENOUS

## 2022-09-27 MED ORDER — LIDOCAINE HCL (CARDIAC) PF 100 MG/5ML IV SOSY
PREFILLED_SYRINGE | INTRAVENOUS | Status: DC | PRN
  Administered 2022-09-27: 40 mg via INTRAVENOUS

## 2022-09-27 MED ORDER — OXYCODONE HCL 5 MG PO TABS
ORAL_TABLET | ORAL | Status: AC
  Filled 2022-09-27: qty 1

## 2022-09-27 MED ORDER — CHLORHEXIDINE GLUCONATE CLOTH 2 % EX PADS: 6.0000 | MEDICATED_PAD | Freq: Once | CUTANEOUS | Status: DC

## 2022-09-27 MED ORDER — OXYCODONE HCL 5 MG/5ML PO SOLN: 5.0000 mg | Freq: Once | ORAL | Status: AC | PRN

## 2022-09-27 MED ORDER — STERILE WATER FOR IRRIGATION IR SOLN
Status: DC | PRN
  Administered 2022-09-27: 1

## 2022-09-27 MED ORDER — FENTANYL CITRATE (PF) 100 MCG/2ML IJ SOLN
INTRAMUSCULAR | Status: AC
  Filled 2022-09-27: qty 2

## 2022-09-27 MED ORDER — CEFAZOLIN SODIUM-DEXTROSE 2-4 GM/100ML-% IV SOLN
INTRAVENOUS | Status: AC
  Filled 2022-09-27: qty 100

## 2022-09-27 MED ORDER — EPHEDRINE SULFATE (PRESSORS) 50 MG/ML IJ SOLN
INTRAMUSCULAR | Status: DC | PRN
  Administered 2022-09-27: 10 mg via INTRAVENOUS

## 2022-09-27 MED ORDER — PHENYLEPHRINE 80 MCG/ML (10ML) SYRINGE FOR IV PUSH (FOR BLOOD PRESSURE SUPPORT)
PREFILLED_SYRINGE | INTRAVENOUS | Status: AC
  Filled 2022-09-27: qty 10

## 2022-09-27 MED ORDER — ACETAMINOPHEN 500 MG PO TABS
1000.0000 mg | ORAL_TABLET | ORAL | Status: AC
  Administered 2022-09-27: 1000 mg via ORAL

## 2022-09-27 MED ORDER — ACETAMINOPHEN 500 MG PO TABS
ORAL_TABLET | ORAL | Status: AC
  Filled 2022-09-27: qty 2

## 2022-09-27 MED ORDER — SODIUM CHLORIDE 0.9 % IV SOLN
INTRAVENOUS | Status: DC | PRN
  Administered 2022-09-27: 60 mL

## 2022-09-27 MED ORDER — GABAPENTIN 300 MG PO CAPS
300.0000 mg | ORAL_CAPSULE | ORAL | Status: AC
  Administered 2022-09-27: 300 mg via ORAL

## 2022-09-27 MED ORDER — BUPIVACAINE-EPINEPHRINE (PF) 0.5% -1:200000 IJ SOLN
INTRAMUSCULAR | Status: AC
  Filled 2022-09-27: qty 30

## 2022-09-27 MED ORDER — DEXAMETHASONE SODIUM PHOSPHATE 10 MG/ML IJ SOLN
INTRAMUSCULAR | Status: AC
  Filled 2022-09-27: qty 1

## 2022-09-27 MED ORDER — SODIUM CHLORIDE FLUSH 0.9 % IV SOLN
INTRAVENOUS | Status: AC
  Filled 2022-09-27: qty 10

## 2022-09-27 MED ORDER — KETOROLAC TROMETHAMINE 30 MG/ML IJ SOLN
INTRAMUSCULAR | Status: DC | PRN
  Administered 2022-09-27: 30 mg via INTRAVENOUS

## 2022-09-27 MED ORDER — CHLORHEXIDINE GLUCONATE CLOTH 2 % EX PADS
6.0000 | MEDICATED_PAD | Freq: Once | CUTANEOUS | Status: AC
  Administered 2022-09-27: 6 via TOPICAL

## 2022-09-27 MED ORDER — ACETAMINOPHEN 500 MG PO TABS: 1000.0000 mg | ORAL_TABLET | Freq: Four times a day (QID) | ORAL | Status: AC | PRN

## 2022-09-27 MED ORDER — FAMOTIDINE 20 MG PO TABS
ORAL_TABLET | ORAL | Status: AC
  Filled 2022-09-27: qty 1

## 2022-09-27 MED ORDER — ORAL CARE MOUTH RINSE: 15.0000 mL | Freq: Once | OROMUCOSAL | Status: AC

## 2022-09-27 MED ORDER — PROPOFOL 10 MG/ML IV BOLUS
INTRAVENOUS | Status: DC | PRN
Start: 2022-09-27 — End: 2022-09-27
  Administered 2022-09-27: 20 mg via INTRAVENOUS
  Administered 2022-09-27: 180 mg via INTRAVENOUS

## 2022-09-27 MED ORDER — CHLORHEXIDINE GLUCONATE 0.12 % MT SOLN
OROMUCOSAL | Status: AC
  Filled 2022-09-27: qty 15

## 2022-09-27 MED ORDER — GABAPENTIN 300 MG PO CAPS
ORAL_CAPSULE | ORAL | Status: AC
  Filled 2022-09-27: qty 1

## 2022-09-27 MED ORDER — LACTATED RINGERS IV SOLN: INTRAVENOUS | Status: DC | PRN

## 2022-09-27 MED ORDER — CEFAZOLIN SODIUM-DEXTROSE 2-4 GM/100ML-% IV SOLN
2.0000 g | INTRAVENOUS | Status: AC
  Administered 2022-09-27: 2 g via INTRAVENOUS

## 2022-09-27 MED ORDER — IBUPROFEN 800 MG PO TABS: 800.0000 mg | ORAL_TABLET | Freq: Three times a day (TID) | ORAL | 1 refills | Status: AC | PRN

## 2022-09-27 MED ORDER — FENTANYL CITRATE (PF) 100 MCG/2ML IJ SOLN
25.0000 ug | INTRAMUSCULAR | Status: DC | PRN
  Administered 2022-09-27 (×3): 50 ug via INTRAVENOUS

## 2022-09-27 SURGICAL SUPPLY — 49 items
ADH SKN CLS APL DERMABOND .7 (GAUZE/BANDAGES/DRESSINGS) ×1
APL PRP STRL LF DISP 70% ISPRP (MISCELLANEOUS) ×1
APPLIER CLIP 9.375 SM OPEN (CLIP)
APR CLP SM 9.3 20 MLT OPN (CLIP)
BINDER BREAST LRG (GAUZE/BANDAGES/DRESSINGS) IMPLANT
BINDER BREAST MEDIUM (GAUZE/BANDAGES/DRESSINGS) IMPLANT
BLADE PHOTON ILLUMINATED (MISCELLANEOUS) ×1 IMPLANT
BLADE SURG 15 STRL LF DISP TIS (BLADE) ×2 IMPLANT
BLADE SURG 15 STRL SS (BLADE) ×2
BRA SURGICAL LRG (MISCELLANEOUS) IMPLANT
CHLORAPREP W/TINT 26 (MISCELLANEOUS) ×1 IMPLANT
CLIP APPLIE 9.375 SM OPEN (CLIP) IMPLANT
CNTNR URN SCR LID CUP LEK RST (MISCELLANEOUS) IMPLANT
CONT SPEC 4OZ STRL OR WHT (MISCELLANEOUS)
DERMABOND ADVANCED .7 DNX12 (GAUZE/BANDAGES/DRESSINGS) ×1 IMPLANT
DEVICE DUBIN SPECIMEN MAMMOGRA (MISCELLANEOUS) ×1 IMPLANT
DRAPE LAPAROTOMY 100X77 ABD (DRAPES) ×1 IMPLANT
DRSG GAUZE FLUFF 36X18 (GAUZE/BANDAGES/DRESSINGS) ×1 IMPLANT
ELECT REM PT RETURN 9FT ADLT (ELECTROSURGICAL) ×1
ELECTRODE REM PT RTRN 9FT ADLT (ELECTROSURGICAL) ×1 IMPLANT
GAUZE 4X4 16PLY ~~LOC~~+RFID DBL (SPONGE) ×1 IMPLANT
GLOVE SURG SYN 7.0 (GLOVE) ×4 IMPLANT
GLOVE SURG SYN 7.0 PF PI (GLOVE) ×1 IMPLANT
GLOVE SURG SYN 7.5 E (GLOVE) ×4 IMPLANT
GLOVE SURG SYN 7.5 PF PI (GLOVE) ×1 IMPLANT
GOWN STRL REUS W/ TWL LRG LVL3 (GOWN DISPOSABLE) ×2 IMPLANT
GOWN STRL REUS W/TWL LRG LVL3 (GOWN DISPOSABLE) ×4
KIT MARKER MARGIN INK (KITS) IMPLANT
KIT TURNOVER KIT A (KITS) ×1 IMPLANT
LABEL OR SOLS (LABEL) ×1 IMPLANT
MANIFOLD NEPTUNE II (INSTRUMENTS) ×1 IMPLANT
NDL HYPO 22X1.5 SAFETY MO (MISCELLANEOUS) ×1 IMPLANT
NEEDLE HYPO 22X1.5 SAFETY MO (MISCELLANEOUS) ×1 IMPLANT
PACK BASIN MINOR ARMC (MISCELLANEOUS) ×1 IMPLANT
SHEATH BREAST BIOPSY SKIN MKR (SHEATH) ×2 IMPLANT
SUT ETHILON 3-0 FS-10 30 BLK (SUTURE)
SUT MNCRL 4-0 (SUTURE) ×1
SUT MNCRL 4-0 27XMFL (SUTURE) ×1
SUT SILK 3 0 SH 30 (SUTURE) IMPLANT
SUT VIC AB 3-0 SH 27 (SUTURE) ×2
SUT VIC AB 3-0 SH 27X BRD (SUTURE) ×1 IMPLANT
SUTURE EHLN 3-0 FS-10 30 BLK (SUTURE) IMPLANT
SUTURE MNCRL 4-0 27XMF (SUTURE) ×1 IMPLANT
SYR 10ML LL (SYRINGE) ×1 IMPLANT
TAPE TRANSPORE STRL 2 31045 (GAUZE/BANDAGES/DRESSINGS) IMPLANT
TRAP FLUID SMOKE EVACUATOR (MISCELLANEOUS) ×1 IMPLANT
TRAP NEPTUNE SPECIMEN COLLECT (MISCELLANEOUS) ×1 IMPLANT
WATER STERILE IRR 1000ML POUR (IV SOLUTION) ×1 IMPLANT
WATER STERILE IRR 500ML POUR (IV SOLUTION) ×1 IMPLANT

## 2022-09-27 NOTE — Interval H&P Note (Signed)
History and Physical Interval Note:  09/27/2022 9:52 AM  Lauren Boyd  has presented today for surgery, with the diagnosis of right breast intraductal papiloma.  The various methods of treatment have been discussed with the patient and family. After consideration of risks, benefits and other options for treatment, the patient has consented to  Procedure(s): BREAST LUMPECTOMY WITH RADIOFREQUENCY TAG IDENTIFICATION (Right) as a surgical intervention.  The patient's history has been reviewed, patient examined, no change in status, stable for surgery.  I have reviewed the patient's chart and labs.  Questions were answered to the patient's satisfaction.     Dollie Mayse

## 2022-09-27 NOTE — Transfer of Care (Signed)
Immediate Anesthesia Transfer of Care Note  Patient: Lauren Boyd  Procedure(s) Performed: BREAST LUMPECTOMY WITH RADIOFREQUENCY TAG IDENTIFICATION (Right: Breast)  Patient Location: PACU  Anesthesia Type:General  Level of Consciousness: drowsy and patient cooperative  Airway & Oxygen Therapy: Patient Spontanous Breathing and Patient connected to nasal cannula oxygen  Post-op Assessment: Report given to RN and Patient moving all extremities  Post vital signs: Reviewed and stable  Last Vitals:  Vitals Value Taken Time  BP 123/67 09/27/22 1308  Temp 36.5 C 09/27/22 1308  Pulse 111 09/27/22 1310  Resp 23 09/27/22 1310  SpO2 100 % 09/27/22 1310  Vitals shown include unfiled device data.  Last Pain:  Vitals:   09/27/22 0857  TempSrc: Temporal  PainSc: 0-No pain         Complications: No notable events documented.

## 2022-09-27 NOTE — Discharge Instructions (Addendum)
Discharge Instructions: 1.  Patient may shower, but do not scrub wounds heavily and dab dry only. 2.  Do not submerge wounds in pool/tub until fully healed. 3.  Do not apply ointments or hydrogen peroxide to the wounds. 4.  May apply ice packs to the wounds for comfort. 5.  Please wear breast binder at all times for the next two weeks.  If it is too tight, then please change for a tighter sports bra to give enough support/compression. 6.  May apply fluffed gauze dressing over the incisions to help with padding. 7.  Do not drive while taking narcotics for pain control.  Prior to driving, make sure you are able to rotate right and left to look at blindspots without significant pain or discomfort. 8.  Avoid strenuous activity with the right arm for two weeks.   AMBULATORY SURGERY  DISCHARGE INSTRUCTIONS   The drugs that you were given will stay in your system until tomorrow so for the next 24 hours you should not:  Drive an automobile Make any legal decisions Drink any alcoholic beverage   You may resume regular meals tomorrow.  Today it is better to start with liquids and gradually work up to solid foods.  You may eat anything you prefer, but it is better to start with liquids, then soup and crackers, and gradually work up to solid foods.   Please notify your doctor immediately if you have any unusual bleeding, trouble breathing, redness and pain at the surgery site, drainage, fever, or pain not relieved by medication.   Information for Discharge Teaching:  Please DO NOT REMOVE TEAL EXPAREL BRACELET FOR 4Days (96 hours), 10/01/2022 EXPAREL (bupivacaine liposome injectable suspension)   Your surgeon or anesthesiologist gave you EXPAREL(bupivacaine) to help control your pain after surgery.  EXPAREL is a local anesthetic that provides pain relief by numbing the tissue around the surgical site. EXPAREL is designed to release pain medication over time and can control pain for up to 72  hours. Depending on how you respond to EXPAREL, you may require less pain medication during your recovery.  Possible side effects: Temporary loss of sensation or ability to move in the area where bupivacaine was injected. Nausea, vomiting, constipation Rarely, numbness and tingling in your mouth or lips, lightheadedness, or anxiety may occur. Call your doctor right away if you think you may be experiencing any of these sensations, or if you have other questions regarding possible side effects.  Follow all other discharge instructions given to you by your surgeon or nurse. Eat a healthy diet and drink plenty of water or other fluids.  If you return to the hospital for any reason within 96 hours following the administration of EXPAREL, it is important for health care providers to know that you have received this anesthetic. A teal colored band has been placed on your arm with the date, time and amount of EXPAREL you have received in order to alert and inform your health care providers. Please leave this armband in place for the full 96 hours following administration, and then you may remove the band.    Please contact your physician with any problems or Same Day Surgery at [phone], Monday through Friday 6 am to 4 pm, or [place] [facility] number at [phone].

## 2022-09-27 NOTE — Op Note (Addendum)
Procedure Date:  09/27/2022  Pre-operative Diagnosis:  Right breast intraductal papilloma, intraductal papilloma with atypia, and PASH  Post-operative Diagnosis: Right breast intraductal papilloma, intraductal papilloma with atypia, and PASH  Procedure:  Right breast SAVI tag localized lumpectomy  Surgeon:  Howie Ill, MD  Assistant:  Lynda Rainwater, PA-S  Anesthesia:  General endotracheal  Estimated Blood Loss:  20 ml  Specimens:   Right breast retroareolar mass Right breast 5 o'clock mass Right breast retroareolar new medial margin Right breast 10 o'clock mass  Complications:  None  Indications for Procedure:  This is a 42 y.o. female who presents with an intraductal papilloma at 5 o'clock in retroareolar position, an intraductal papilloma with atypica at 5 o'clock position 5 CMFN, and PASH at 10 o'clock 10 CMFN.  The presents for excision of all three areas.  The risks of bleeding, infection, injury to surrounding structures, hematoma, seroma, open wound, cosmetic deformity, and the need for further surgery were all discussed with the patient and was willing to proceed.  Prior to this procedure, the patient had undergone SAVI tag localization of all three areas.  Description of Procedure: The patient was correctly identified in the preoperative area and brought into the operating room.  The patient was placed supine with VTE prophylaxis in place.  Appropriate time-outs were performed.  Anesthesia was induced and the patient was intubated.  Appropriate antibiotics were infused.  The right chest was prepped and draped in usual sterile fashion.  The SAVI tag localization sites in all positions were determined using the scout device  We started with the retroareolar position.  An periareolar incision was made overlying the tag and clip.  SAVI Scout probe was used to guide our dissection using electrocautery, and a partial mastectomy was performed.  MarginMarker was used to ink each  of the sides of the specimen.  The specimen was then imaged to confirm that the area of concern, biopsy clip, and SAVI tag were included in the excision.  This was then sent to pathology.  We needed to excise an additional medial margin as a precaution as the mass abutted the medial margin.  The cavity was irrigated and hemostasis was assured with electrocautery.  Local anesthetic was infiltrated into the skin and subcutaneous tissue of the cavity.  The wound was then closed in two layers with 3-0 Vicryl and 4-0 Monocryl and sealed with DermaBond.  We then moved to the 5 o'clock 5 CMFN site.  An incision was made overlying the tag and clip.  Scout probe was used to guide our dissection using electrocautery, and a partial mastectomy was performed with adequate margins.  MarginMarker was used to ink each of the sides of the specimen.  The specimen was then imaged to confirm that the area of concern, biopsy clip, and SAVI tag were included in the excision.  This was then sent to pathology.  The cavity was irrigated and hemostasis was assured with electrocautery.  Local anesthetic was infiltrated into the skin and subcutaneous tissue of the cavity.  The wound was then closed in two layers with 3-0 Vicryl and 4-0 Monocryl and sealed with DermaBond.  Finally we moved to the 10 o'clock position.  An incision was made overlying the tag and clip.  Scout  probe was used to guide our dissection using electrocautery, and a partial mastectomy was performed with adequate margins.  MarginMarker was used to ink each of the sides of the specimen.  The specimen was then imaged to confirm  that the area of concern, biopsy clip, and SAVI tag were included in the excision.  This was then sent to pathology.  The cavity was irrigated and hemostasis was assured with electrocautery.  Local anesthetic was infiltrated into the skin and subcutaneous tissue of the cavity.  The wound was then closed in two layers with 3-0 Vicryl and 4-0  Monocryl and sealed with DermaBond.  Please note that these were three separate incisions, each with a specimen with appropriate margins and marking.  Fluffed gauze and breast binder were applied.  The patient was emerged from anesthesia and extubated and brought to the recovery room for further management.  The patient tolerated the procedure well and all counts were correct at the end of the case.   Howie Ill, MD

## 2022-09-27 NOTE — Anesthesia Procedure Notes (Signed)
Procedure Name: LMA Insertion Date/Time: 09/27/2022 10:20 AM  Performed by: Lily Lovings, CRNAPre-anesthesia Checklist: Patient identified, Patient being monitored, Timeout performed, Emergency Drugs available and Suction available Patient Re-evaluated:Patient Re-evaluated prior to induction Oxygen Delivery Method: Circle system utilized Preoxygenation: Pre-oxygenation with 100% oxygen Induction Type: IV induction Ventilation: Mask ventilation without difficulty LMA: LMA inserted LMA Size: 4.0 Tube type: Oral Number of attempts: 1 Placement Confirmation: positive ETCO2 and breath sounds checked- equal and bilateral Tube secured with: Tape Dental Injury: Teeth and Oropharynx as per pre-operative assessment

## 2022-09-27 NOTE — Anesthesia Preprocedure Evaluation (Signed)
Anesthesia Evaluation  Patient identified by MRN, date of birth, ID band Patient awake    Reviewed: Allergy & Precautions, NPO status , Patient's Chart, lab work & pertinent test results  History of Anesthesia Complications Negative for: history of anesthetic complications  Airway Mallampati: III  TM Distance: >3 FB Neck ROM: full    Dental  (+) Chipped, Dental Advidsory Given   Pulmonary neg pulmonary ROS, neg shortness of breath, neg COPD, former smoker   Pulmonary exam normal        Cardiovascular negative cardio ROS Normal cardiovascular exam     Neuro/Psych  PSYCHIATRIC DISORDERS Anxiety     negative neurological ROS     GI/Hepatic negative GI ROS, Neg liver ROS,neg GERD  ,,  Endo/Other  negative endocrine ROS    Renal/GU      Musculoskeletal   Abdominal   Peds  Hematology negative hematology ROS (+)   Anesthesia Other Findings Past Medical History: No date: Anxiety No date: B12 deficiency No date: Calculus of gallbladder without cholecystitis without  obstruction No date: GERD (gastroesophageal reflux disease)     Comment:  no meds 06/20/2019: History of 2019 novel coronavirus disease (COVID-19) No date: Intraductal papilloma     Comment:  right breast No date: Low ferritin No date: Numbness and tingling of left side of face No date: Recurrent umbilical hernia  Past Surgical History: 08/12/2022: BREAST BIOPSY; Right     Comment:  u/s guided bx, mass, COIL clip-INTRADUCTAL PAPILLOMA               WITH FOCAL SCLEROSIS 09/02/2022: BREAST BIOPSY; Right     Comment:  stereo bx, asymmetry,X Clip X-CLIP) - PSEUDOANGIOMATOUS               STROMAL HYPERPLASIA, STROMAL FIBROSIS, AND COLUMNAR CELL               CHANGE. - NEGATIVE FOR ATYPIA AND MALIGNANCY 09/02/2022: BREAST BIOPSY; Right     Comment:  u/s bx, mass, RIBBON  NTRADUCTAL PAPILLOMA WITH FOCAL               EPITHELIAL ATYPIA AND SCLEROSIS.  Comment: IHC for CK5/6               and ER was performed on part A. The atypical epithelial               cells are negative for CK5/6 with retained ER expression.              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 09/02/2022: BREAST BIOPSY; Right     Comment:  [place] RT BREAST BX W LOC DEV 1ST LESION IMG BX SPEC [place]               GUIDE 09/02/2022 [facility] 09/02/2022: BREAST BIOPSY; Right     Comment:  MM RT BREAST BX W LOC DEV 1ST LESION IMAGE BX SPEC               STEREO GUIDE 09/02/2022 [facility] 09/19/2022: BREAST BIOPSY; Right     Comment:  MM RT RADIO FREQUENCY TAG LOC MAMMO GUIDE 09/19/2022               [facility] 09/19/2022: BREAST BIOPSY; Right     Comment:  MM RT RADIO FREQUENCY TAG EA ADD LESION LOC MAMMO GUIDE  09/19/2022 [facility] 09/19/2022: BREAST LUMPECTOMY WITH RADIOFREQUENCY TAG IDENTIFICATION;  Right     Comment:  2 savi scout place prior to  surgery 09/27/22 2018: CHOLECYSTECTOMY 2014: HERNIA REPAIR     Comment:  Umbical 02/09/2021: INSERTION OF MESH     Comment:  Procedure: INSERTION OF MESH;  Surgeon: Henrene Dodge,               MD;  Location: [facility];  Service: General;; No date: TUBAL LIGATION     Reproductive/Obstetrics negative OB ROS                             Anesthesia Physical Anesthesia Plan  ASA: 2  Anesthesia Plan: General   Post-op Pain Management:    Induction: Intravenous  PONV Risk Score and Plan: Midazolam, Dexamethasone and Ondansetron  Airway Management Planned: LMA  Additional Equipment:   Intra-op Plan:   Post-operative Plan: Extubation in OR  Informed Consent: I have reviewed the patients History and Physical, chart, labs and discussed the procedure including the risks, benefits and alternatives for the proposed anesthesia with the patient or authorized representative who has indicated his/her understanding and acceptance.      Dental Advisory Given  Plan Discussed with: Anesthesiologist, CRNA and Surgeon  Anesthesia Plan Comments: (Patient consented for risks of anesthesia including but not limited to:  - adverse reactions to medications - damage to eyes, teeth, lips or other oral mucosa - nerve damage due to positioning  - sore throat or hoarseness - Damage to heart, brain, nerves, lungs, other parts of body or loss of life  Patient voiced understanding.)       Anesthesia Quick Evaluation

## 2022-09-28 ENCOUNTER — Encounter: Payer: Self-pay | Admitting: Surgery

## 2022-09-28 NOTE — Anesthesia Postprocedure Evaluation (Signed)
Anesthesia Post Note  Patient: Lauren Boyd  Procedure(s) Performed: BREAST LUMPECTOMY WITH RADIOFREQUENCY TAG IDENTIFICATION (Right: Breast)  Patient location during evaluation: PACU Anesthesia Type: General Level of consciousness: awake and alert, oriented and patient cooperative Pain management: pain level controlled Vital Signs Assessment: post-procedure vital signs reviewed and stable Respiratory status: spontaneous breathing, nonlabored ventilation and respiratory function stable Cardiovascular status: blood pressure returned to baseline and stable Postop Assessment: adequate PO intake Anesthetic complications: no   No notable events documented.   Last Vitals:  Vitals:   09/27/22 1400 09/27/22 1419  BP: 118/65   Pulse: 93 97  Resp: 16 16  Temp: 36.8 C 36.9 C  SpO2: 92%     Last Pain:  Vitals:   09/27/22 1419  TempSrc: Temporal  PainSc:                  [name]

## 2022-10-03 NOTE — Progress Notes (Signed)
10/03/22  Called patient to discuss pathology results from right breast lumpectomies.  Overall all three sites excised showed no malignancy.  The retroareolar area showed an intraductal papilloma, the 7 o'clock area showed also an intraductal papilloma without further atypia (there was atypia on initial biopsy), and the 10 o'clock area showed PASH consistent with the initial biopsy.  All margins were clear.  Patient is doing well and has an appointment with me on 10/12/22.  Henrene Dodge, MD

## 2022-10-06 ENCOUNTER — Other Ambulatory Visit: Payer: Self-pay | Admitting: Gastroenterology

## 2022-10-06 DIAGNOSIS — R1013 Epigastric pain: Secondary | ICD-10-CM

## 2022-10-09 NOTE — Progress Notes (Unsigned)
Office Visit Note  Patient: Lauren Boyd             Date of Birth: 1980-04-30           MRN: [id number]             PCP: [name], MD Referring: [name], MD Visit Date: 10/11/2022 Occupation: Transport planner for [employer]  Subjective:  New Patient (Initial Visit) (Patient states states she frequently gets nauseous. )   History of Present Illness: Lauren Boyd is a 42 y.o. female here for evaluation of positive ANA checked initially in context of abnormal liver enzymes with abdominal bloating or pain.  She was being evaluated due to chronic persistent nausea ongoing for years not associated with any vomiting diarrhea abdominal pain or unintentional weight loss.  Previous workup including abdominal imaging and EGD have been unremarkable.  Visit with hepatology clinic due to abnormal liver enzymes demonstrated moderately high positive ANA but was negative for anti-smooth muscle antibodies and normalization of liver enzymes at that time.  Subsequently also screen for thyroid disease that was normal.  More recently had EGD procedure looking for esophageal motility earlier this month that did not find any inflammatory changes but demonstrated hiatal hernia.  Unclear if new or progressed compared to on previous testing.  This was thought to be causing some reported retrosternal chest pain.  She denies symptoms of acid reflux.  She has been scheduled for fundoplication to correct this but initially was delayed because she also needed to have breast lumpectomy for nodules found on routine screening. She denies any complaints of chronic joint pain, skin rashes, oral nasal ulcers, lymphadenopathy, alopecia, Raynaud's symptoms, abnormal bleeding or blood clots. Does not have any known family history of autoimmune disease.  Labs reviewed ANA 1:320 speckled ASMA neg  Activities of Daily Living:  Patient reports morning stiffness for 0 minute.   Patient Reports nocturnal pain.   Difficulty dressing/grooming: Denies Difficulty climbing stairs: Denies Difficulty getting out of chair: Denies Difficulty using hands for taps, buttons, cutlery, and/or writing: Denies  Review of Systems  Constitutional:  Negative for fatigue.  HENT:  Negative for mouth sores and mouth dryness.   Eyes:  Negative for dryness.  Respiratory:  Negative for shortness of breath.   Cardiovascular:  Positive for chest pain. Negative for palpitations.  Gastrointestinal:  Positive for nausea. Negative for blood in stool, constipation and diarrhea.  Endocrine: Negative for increased urination.  Genitourinary:  Negative for involuntary urination.  Musculoskeletal:  Negative for joint pain, gait problem, joint pain, joint swelling, myalgias, muscle weakness, morning stiffness, muscle tenderness and myalgias.  Skin:  Negative for color change, rash, hair loss and sensitivity to sunlight.  Allergic/Immunologic: Negative for susceptible to infections.  Neurological:  Negative for dizziness and headaches.  Hematological:  Negative for swollen glands.  Psychiatric/Behavioral:  Positive for depressed mood and sleep disturbance. The patient is not nervous/anxious.     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 09/27/2022   Pseudoangiomatous stromal hyperplasia of breast 09/27/2022   Recurrent umbilical hernia    Low ferritin 08/02/2019   B12 deficiency 08/02/2019   Difficulty walking 07/18/2019   Numbness and tingling of left side of face 07/18/2019   Calculus of gallbladder without cholecystitis without obstruction 08/02/2016    Past Medical History:  Diagnosis Date   Anxiety    B12 deficiency    Calculus of gallbladder without cholecystitis without obstruction    GERD (gastroesophageal reflux disease)     no meds   History of 2019 novel coronavirus disease (COVID-19) 06/20/2019   Intraductal papilloma    right breast   Low ferritin    Numbness and tingling of left side of face    Recurrent umbilical hernia     Family History  Problem Relation Age of Onset   Healthy Mother    Healthy Father    Healthy Brother    Breast cancer Maternal Grandmother 35   Past Surgical History:  Procedure Laterality Date   BREAST BIOPSY Right 08/12/2022   u/s guided bx, mass, COIL clip-INTRADUCTAL PAPILLOMA WITH FOCAL SCLEROSIS   BREAST BIOPSY Right 09/02/2022   stereo bx, asymmetry,X Clip X-CLIP) - PSEUDOANGIOMATOUS STROMAL HYPERPLASIA, STROMAL FIBROSIS, AND COLUMNAR CELL CHANGE. - NEGATIVE FOR ATYPIA AND MALIGNANCY   BREAST BIOPSY Right 09/02/2022   u/s bx, mass, RIBBON  NTRADUCTAL PAPILLOMA WITH FOCAL EPITHELIAL ATYPIA AND SCLEROSIS. Comment: IHC for CK5/6 and ER was performed on part A. The atypical epithelial cells are negative for CK5/6 with retained ER expression. 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   BREAST BIOPSY Right 09/02/2022   [place] RT BREAST BX W LOC DEV 1ST LESION IMG BX SPEC US GUIDE 09/02/2022 [facility]   BREAST BIOPSY Right 09/02/2022   MM RT BREAST BX W LOC DEV 1ST LESION IMAGE BX SPEC STEREO GUIDE 09/02/2022 [facility]   BREAST BIOPSY Right 09/19/2022   MM RT RADIO FREQUENCY TAG LOC MAMMO GUIDE 09/19/2022 [facility]   BREAST BIOPSY Right 09/19/2022   MM RT RADIO FREQUENCY TAG EA ADD LESION LOC MAMMO GUIDE 09/19/2022 [facility]   BREAST LUMPECTOMY WITH RADIOFREQUENCY TAG IDENTIFICATION Right 09/19/2022   2 savi scout place prior to  surgery 09/27/22   BREAST LUMPECTOMY WITH RADIOFREQUENCY TAG IDENTIFICATION Right 09/27/2022   Procedure: BREAST LUMPECTOMY WITH RADIOFREQUENCY TAG IDENTIFICATION;  Surgeon: Henrene Dodge, MD;  Location: [facility];  Service: General;  Laterality: Right;   CHOLECYSTECTOMY  2018   HERNIA REPAIR  2014   Umbical    INSERTION OF MESH  02/09/2021   Procedure: INSERTION OF MESH;  Surgeon: Henrene Dodge, MD;  Location: [facility];  Service: General;;   TUBAL LIGATION     Social History   Social History Narrative   Not on file    There is no immunization history on file for this patient.   Objective: Vital Signs: BP 112/76 (BP Location: Right Arm, Patient Position: Sitting, Cuff Size: Normal)   Pulse 68   Resp 14   Ht 5\' 4"  (1.626 m)   Wt 185 lb (83.9 kg)   LMP 10/11/2022 (Approximate)   BMI 31.76 kg/m    Physical Exam HENT:     Mouth/Throat:     Mouth: Mucous membranes are moist.     Pharynx: Oropharynx is clear.  Eyes:     Conjunctiva/sclera: Conjunctivae normal.  Cardiovascular:     Rate and Rhythm: Normal rate and regular rhythm.  Pulmonary:     Effort: Pulmonary effort is normal.     Breath sounds: Normal breath sounds.  Musculoskeletal:     Right lower leg: No edema.     Left lower leg: No edema.  Lymphadenopathy:     Cervical: No cervical adenopathy.  Skin:    General: Skin is warm and dry.     Findings: No rash.  Neurological:     Mental Status: She is alert.  Psychiatric:        Mood and Affect: Mood normal.      Musculoskeletal Exam:  Shoulders full ROM no tenderness or swelling Elbows full ROM no tenderness or swelling Wrists full ROM no tenderness or swelling Fingers full ROM no tenderness or swelling Knees full ROM no tenderness or swelling Ankles full ROM no tenderness or swelling MTPs full ROM no tenderness or swelling   Investigation: No additional findings.  Imaging: MM Breast Surgical Specimen  Result Date: 09/27/2022 CLINICAL DATA:  Status post Savi Scout localized right breast three-site excision EXAM: SPECIMEN RADIOGRAPH OF THE RIGHT BREAST COMPARISON:  Previous exam(s). FINDINGS: Status post three-site excision of the right breast. Three Savi Scout reflectors, a coil clip, and a ribbon clip are present within the three provided specimens.  IMPRESSION: Specimen radiograph of the right breast. Electronically Signed   By: Jacob Moores M.D.   On: 09/27/2022 12:47   MM RT RADIO FREQUENCY TAG LOC MAMMO GUIDE  Result Date: 09/19/2022 CLINICAL DATA:  Patient is status post stereotactic guided biopsy of a RIGHT breast asymmetry demonstrating PASH and fibrosis (x clip, displaced). Patient is status post ultrasound-guided biopsy of a RIGHT breast mass which demonstrated a papilloma with focal ADH (RIBBON clip). SAVI was previously placed at an additional papilloma (COIL clip). EXAM: NEEDLE LOCALIZATION OF THE RIGHT BREAST WITH MAMMO GUIDANCE x2 COMPARISON:  Previous exam(s). FINDINGS: Patient presents for needle localization prior to excisional biopsy. I met with the patient and we discussed the procedure of needle localization including benefits and alternatives. We discussed the high likelihood of a successful procedure. We discussed the risks of the procedure, including infection, bleeding, tissue injury, and further surgery. Informed, written consent was given. The usual time-out protocol was performed immediately prior to the procedure. Pre localization images were obtained. This confirmed superior displacement of the X clip from the asymmetry of mammographic concern. As such, the asymmetry itself is targeted with stereotactic guidance. Using stereotactic mammographic guidance, sterile technique, 1% lidocaine and a 10 cm SAVI SCOUT needle, the RIBBON clip was localized using a lateral approach. The images were marked for Dr. Aleen Campi. Using stereotactic mammographic guidance, sterile technique, 1% lidocaine and a 10 cm SAVI SCOUT needle, the ASYMMETRY was localized using a lateral approach. The images were marked for Dr. Aleen Campi. IMPRESSION: 1. Radar reflector localization of the RIGHT breast at site of papilloma with ADH (RIBBON clip). No apparent complications. 2. Radar reflector localization of the RIGHT breast at site of an asymmetry. SAVI is  located in the lateral aspect of the asymmetry. No apparent complications. 3. Please note that the X clip is displaced from the asymmetry and likely will not be present in the surgical specimen. Electronically Signed   By: Meda Klinefelter M.D.   On: 09/19/2022 16:40  MM RT RADIO FREQUENCY TAG EA ADD LESION LOC MAMMO GUIDE  Result Date: 09/19/2022 CLINICAL DATA:  Patient is status post stereotactic guided biopsy of a RIGHT breast asymmetry demonstrating PASH and fibrosis (x clip, displaced). Patient is status post ultrasound-guided biopsy of a RIGHT breast mass which demonstrated a papilloma with focal ADH (RIBBON clip). SAVI was previously placed at an additional papilloma (COIL clip). EXAM: NEEDLE LOCALIZATION OF THE RIGHT BREAST WITH MAMMO GUIDANCE x2 COMPARISON:  Previous exam(s). FINDINGS: Patient presents for needle localization prior to excisional biopsy. I met with the patient and we discussed the procedure of needle localization including benefits and alternatives.  We discussed the high likelihood of a successful procedure. We discussed the risks of the procedure, including infection, bleeding, tissue injury, and further surgery. Informed, written consent was given. The usual time-out protocol was performed immediately prior to the procedure. Pre localization images were obtained. This confirmed superior displacement of the X clip from the asymmetry of mammographic concern. As such, the asymmetry itself is targeted with stereotactic guidance. Using stereotactic mammographic guidance, sterile technique, 1% lidocaine and a 10 cm SAVI SCOUT needle, the RIBBON clip was localized using a lateral approach. The images were marked for Dr. Aleen Campi. Using stereotactic mammographic guidance, sterile technique, 1% lidocaine and a 10 cm SAVI SCOUT needle, the ASYMMETRY was localized using a lateral approach. The images were marked for Dr. Aleen Campi. IMPRESSION: 1. Radar reflector localization of the RIGHT breast at  site of papilloma with ADH (RIBBON clip). No apparent complications. 2. Radar reflector localization of the RIGHT breast at site of an asymmetry. SAVI is located in the lateral aspect of the asymmetry. No apparent complications. 3. Please note that the X clip is displaced from the asymmetry and likely will not be present in the surgical specimen. Electronically Signed   By: Meda Klinefelter M.D.   On: 09/19/2022 16:40   Recent Labs: Lab Results  Component Value Date   WBC 6.8 11/21/2019   HGB 12.4 11/21/2019   PLT 271 11/21/2019   NA 140 12/02/2018   K 4.0 12/02/2018   CL 109 12/02/2018   CO2 24 12/02/2018   GLUCOSE 101 (H) 12/02/2018   BUN 25 (H) 12/02/2018   CREATININE 0.83 12/02/2018   BILITOT 0.5 12/02/2018   ALKPHOS 52 12/02/2018   AST 17 12/02/2018   ALT 16 12/02/2018   PROT 6.9 12/02/2018   ALBUMIN 4.2 12/02/2018   CALCIUM 9.3 12/02/2018   GFRAA >60 12/02/2018    Speciality Comments: No specialty comments available.  Procedures:  No procedures performed Allergies: Requip [ropinirole], Sertraline, and Vyvanse [lisdexamfetamine]   Assessment / Plan:     Visit Diagnoses: Positive ANA (antinuclear antibody) - Plan: RNP Antibody, Anti-Smith antibody, Sjogrens syndrome-A extractable nuclear antibody, Sjogrens syndrome-B extractable nuclear antibody, Anti-DNA antibody, double-stranded, C3 and C4, Anti-scleroderma antibody  Moderately high positive ANA but currently without any exam or history findings with specific clinical criteria vitamin connective tissue disease.  Already had screening negative for abnormal thyroid function.  Did not appear to have any autoimmune liver disease with normalization of enzymes and negative DC specific antibodies.  Will check specific antibody panel as detailed above and serum complements but with low pretest suspicion.  Orders: Orders Placed This Encounter  Procedures   RNP Antibody   Anti-Smith antibody   Sjogrens syndrome-A extractable  nuclear antibody   Sjogrens syndrome-B extractable nuclear antibody   Anti-DNA antibody, double-stranded   C3 and C4   Anti-scleroderma antibody   No orders of the defined types were placed in this encounter.    Follow-Up Instructions: Return if symptoms worsen or fail to improve.   Fuller Plan, MD  Note - This record has been created using [employer].  Chart creation errors have been sought, but may not always  have been located. Such creation errors do not reflect on  the standard of medical care.

## 2022-10-10 ENCOUNTER — Other Ambulatory Visit: Payer: Self-pay | Admitting: Surgery

## 2022-10-11 ENCOUNTER — Encounter: Payer: Self-pay | Admitting: Internal Medicine

## 2022-10-11 ENCOUNTER — Ambulatory Visit: Payer: Medicaid Other | Attending: Internal Medicine | Admitting: Internal Medicine

## 2022-10-11 VITALS — BP 112/76 | HR 68 | Resp 14 | Ht 64.0 in | Wt 185.0 lb

## 2022-10-11 DIAGNOSIS — R768 Other specified abnormal immunological findings in serum: Secondary | ICD-10-CM

## 2022-10-12 ENCOUNTER — Encounter: Payer: Self-pay | Admitting: Surgery

## 2022-10-12 ENCOUNTER — Ambulatory Visit: Payer: Medicaid Other | Admitting: Surgery

## 2022-10-12 VITALS — BP 128/77 | HR 105 | Temp 97.8°F | Ht 64.0 in | Wt 185.4 lb

## 2022-10-12 DIAGNOSIS — N6091 Unspecified benign mammary dysplasia of right breast: Secondary | ICD-10-CM

## 2022-10-12 DIAGNOSIS — N6489 Other specified disorders of breast: Secondary | ICD-10-CM

## 2022-10-12 DIAGNOSIS — Z09 Encounter for follow-up examination after completed treatment for conditions other than malignant neoplasm: Secondary | ICD-10-CM

## 2022-10-12 DIAGNOSIS — D241 Benign neoplasm of right breast: Secondary | ICD-10-CM

## 2022-10-12 NOTE — Patient Instructions (Addendum)
A referral has been placed to Oncology. They will call you with an appointment.   Dr. Sterling Big [phone].   If you have any concerns or questions, please feel free to call our office. Follow up first of June 2025 followed by a mammogram.   Atypical Hyperplasia of the Breast Atypical hyperplasia of the breast is a condition where some cells in the breast are unusual or abnormal. The cells can be found in the small tubes (ducts) or small lobes (lobules) in the breast. Compared with normal breast cells, the cells seen in atypical hyperplasia: Grow faster than normal breast cells. Are organized in an unusual way. Grow in larger numbers than normal breast cells (overproduction). Are unusual in size and shape. Atypical hyperplasia is not a form of breast cancer. However, it may: Make you more likely to develop breast cancer. Turn into cancer (is precancerous), if it is not treated. What are the causes? The cause of this condition is not known. What increases the risk? The following factors may make you more likely to develop this condition: Age. Your risk increases as you get older. Certain genes passed from parent to child (inherited), or having a family history or your own history of breast cancer. Having had radiation treatments to your breasts or chest area before. Using or having used hormones, such as estrogen. Starting your menstrual period (menstruation) before the age of 36, or going through menopause after age 27. Giving birth to your first child after age 71, or never having given birth. Never having breastfed, if you have given birth. Other factors that may increase your risk include: Being overweight or not being physically active. Having more than one alcoholic drink a day. What are the signs or symptoms? There are no symptoms of this condition. How is this diagnosed?  This condition is usually found during a routine X-ray study of the breasts (mammogram). If a  mammogram shows something concerning, a biopsy is done. This means that a small sample of breast tissue is removed and cells in the tissue are looked at under a microscope. Most often, breast cells can be removed through a needle. Sometimes, a small cut (incision) will need to be made in the breast to remove a sample of breast tissue. The biopsy can help diagnose atypical hyperplasia. How is this treated? This condition may be treated with: Monitoring breast health more often. This is done with: Clinical breast exams. Mammograms. Ultrasounds. MRIs. Chemoprevention. These are medicines to keep the abnormal cells from spreading and becoming cancer. A lumpectomy, also called breast-conserving surgery. This is when the area of abnormal cells is removed, along with some of the normal tissue in the area. A simple mastectomy. This is the removal of breast tissue, the nipple, and the circle of colored tissue around the nipple (areola). Sometimes, one or more lymph nodes from under the arm are also removed. A preventive mastectomy. This is when both breasts are removed. This is usually done only if there is a very high risk of developing breast cancer. Follow these instructions at home: Breast health Check your breasts well after every menstrual period. If you do not have menstrual periods, check your breasts on the first day of every month. Feel for changes in your breasts, such as: More tenderness. A new growth. A change in size or shape. A change in an existing lump. Follow your health care provider's instructions on how often you should have a mammogram or other imaging tests. Alcohol use Do not  drink alcohol if: Your health care provider tells you not to drink. You are pregnant, may be pregnant, or are planning to become pregnant. If you drink alcohol: Limit how much you have to 0-1 drink a day. Know how much alcohol is in your drink. In the [place] one drink equals one 12 oz bottle of beer (355  mL), one 5 oz glass of wine (148 mL), or one 1 oz glass of hard liquor (44 mL). General instructions Take over-the-counter and prescription medicines only as told by your health care provider. Eat a healthy diet. This includes low-fat dairy products, lean meats, and fiber-rich foods, such as fruits, vegetables, and whole grains. To get more fiber: Make sure half your plate is filled with fruits or vegetables. Choose high-fiber foods such as whole-grain breads and cereals. Do not use any products that contain nicotine or tobacco. These products include cigarettes, chewing tobacco, and vaping devices, such as e-cigarettes. If you need help quitting, ask your health care provider. Keep all follow-up visits. This is important. Contact a health care provider if you have: A change in shape or size of your breasts or nipples. A change in the skin of your breast or nipples, such as a reddened, scaly area or tiny bumps like dimples. Unusual discharge from your nipples. A lump or thick area that was not there before. Pain in your breasts. Get help right away if you have: Trouble breathing. Chest pain. Redness on your breast and the redness is spreading. Summary Atypical hyperplasia of the breast is a condition where some cells in the breast are unusual or abnormal. Atypical hyperplasia is not a form of breast cancer. However, it may make you more likely to develop breast cancer. This condition is usually found during a routine X-ray study of the breasts (mammogram). Check your breasts well after every menstrual period. If you do not have menstrual periods, check your breasts on the first day of every month. Tell your health care provider if you notice any changes in your breasts. This information is not intended to replace advice given to you by your health care provider. Make sure you discuss any questions you have with your health care provider. Document Revised: 02/25/2021 Document Reviewed:  02/25/2021 Elsevier Patient Education  2024 [employer].

## 2022-10-12 NOTE — Progress Notes (Signed)
10/12/2022  HPI: Lauren Boyd is a 42 y.o. female s/p right breast lumpectomy of 3 different areas on 09/27/2022.  Patient presents today for follow-up.  The 3 areas were located at 7:00 retroareolar, 7:00 5 cm from nipple, and 10:00 10 cm from nipple.  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  Final pathology after excision confirmed the same findings and there was no further residual ADH at the 7 o'clock position.  The patient reports that she has been doing well with some residual soreness particularly at the nipple area which remains a little bit sensitive.  She did have an episode of small amount of fluid drainage from the periareolar incision a couple days ago but has since stopped.  Vital signs: BP 128/77   Pulse (!) 105   Temp 97.8 F (36.6 C)   Ht 5\' 4"  (1.626 m)   Wt 185 lb 6.4 oz (84.1 kg)   LMP 10/11/2022 (Approximate)   SpO2 99%   BMI 31.82 kg/m    Physical Exam: Constitutional: No acute distress Breast: Right breast incisions are healing well and are clean, dry, intact.  There may be some mild burn changes at the periareolar incision which are healing right now by the Dermabond.  This may reflect the reason for mild drainage a few days ago.  Gauze dressing applied as a precaution.  Assessment/Plan: This is a 42 y.o. female s/p right breast lumpectomy of 3 different areas.  - Reviewed again with the patient the pathology results.  Given that the 7:00 5 cm from nipple biopsy showed a component of ADH, discussed with her that it would be prudent to do a referral to the oncology team for potential endocrine therapy to decrease her risk of breast cancer in the future. - Discussed with her that the periareolar wound may have opened a little bit if there were some burn changes to the skin edges.  However this will heal on its own and currently there  is no evidence of infection and no antibiotics are needed. - Patient will follow-up with me in June 2025 with repeat mammograms or sooner if there are any other issues with her wounds.   Howie Ill, MD [facility]

## 2022-10-18 ENCOUNTER — Inpatient Hospital Stay: Payer: Medicaid Other | Attending: Internal Medicine | Admitting: Internal Medicine

## 2022-10-18 ENCOUNTER — Inpatient Hospital Stay: Payer: Medicaid Other

## 2022-10-18 ENCOUNTER — Encounter: Payer: Self-pay | Admitting: Internal Medicine

## 2022-10-18 VITALS — BP 125/82 | HR 81 | Temp 99.3°F | Wt 186.6 lb

## 2022-10-18 DIAGNOSIS — N6091 Unspecified benign mammary dysplasia of right breast: Secondary | ICD-10-CM | POA: Diagnosis not present

## 2022-10-18 DIAGNOSIS — Z87891 Personal history of nicotine dependence: Secondary | ICD-10-CM | POA: Insufficient documentation

## 2022-10-18 DIAGNOSIS — Z8616 Personal history of COVID-19: Secondary | ICD-10-CM | POA: Insufficient documentation

## 2022-10-18 DIAGNOSIS — Z803 Family history of malignant neoplasm of breast: Secondary | ICD-10-CM | POA: Insufficient documentation

## 2022-10-18 DIAGNOSIS — Z79810 Long term (current) use of selective estrogen receptor modulators (SERMs): Secondary | ICD-10-CM | POA: Insufficient documentation

## 2022-10-18 MED ORDER — TAMOXIFEN CITRATE 20 MG PO TABS: 20.0000 mg | ORAL_TABLET | Freq: Every day | ORAL | 0 refills | Status: DC

## 2022-10-18 NOTE — Progress Notes (Signed)
[facility] CONSULT NOTE  Patient Care Team: [name], MD as PCP - General (Family Medicine) Naoma Diener, NP as Nurse Practitioner (Neurology) Michaelyn Barter, MD as Consulting Physician (Oncology)  REFERRING PROVIDER: Dr. Aleen Campi  REASON FOR REFFERAL: Right breast ADH  CANCER STAGING   Cancer Staging  No matching staging information was found for the patient.   ASSESSMENT & PLAN:  Lauren Boyd 42 y.o. female with pmh of anxiety, B12 deficiency, GERD was referred to medical oncology to discuss about chemoprevention for right breast ADH.  # Right breast ADH - s/p right breast 7:00 5 cm from nipple US guided biopsy showed intraductal papilloma with focal epithelial atypia and sclerosis.  Consistent with atypical ductal hyperplasia. - s/p right breast lumpectomy with Dr. Aleen Campi on 09/27/2022.  Showed intraductal papilloma.  No further evidence of atypia or malignancy.  Path report below.  - ADH can increase the risk of having invasive breast cancer in the future.  Based on NCI breast cancer risk assessment she has lifetime risk of developing invasive breast cancer is 19.9% versus general population at 12.2%.  Discussed about role of tamoxifen for chemoprevention 20 mg daily for 5 years which can decrease the risk of developing invasive breast cancer by about 30% with no difference in the overall survival.  Side effects such as hot flashes, mood changes, increased cholesterol level, increase in the risk of endometrial cancer and thromboembolic event was discussed with the patient.  After lengthy discussion, patient has decided to proceed with tamoxifen.  Her last lipid panel is from May 2024, LDL was 98.  Will recheck in 1 year.  Patient reports family history of breast cancer in her grandmother on the maternal side.  Reports that recently her mother was found to have abnormal mammogram and has been referred for further evaluation.  I would like to discuss about  genetic testing with her next visit.  I will follow-up in 3 months to assess tolerance to tamoxifen with labs.  -She is due for next mammogram in May 2025.   Orders Placed This Encounter  Procedures   CBC with Differential/Platelet    Standing Status:   Future    Standing Expiration Date:   10/18/2023   Comprehensive metabolic panel    Standing Status:   Future    Standing Expiration Date:   10/18/2023   RTC in 3 months for MD visit, labs.  The total time spent in the appointment was 55 minutes encounter with patients including review of chart and various tests results, discussions about plan of care and coordination of care plan   All questions were answered. The patient knows to call the clinic with any problems, questions or concerns. No barriers to learning was detected.  Michaelyn Barter, MD 8/6/20244:12 PM   HISTORY OF PRESENTING ILLNESS:  Lauren Boyd 42 y.o. female with pmh of anxiety, B12 deficiency, GERD was referred to medical oncology to discuss about chemoprevention for right breast ADH.  Patient had screening mammogram on 08/05/2022 which showed 6 mm mass in the lower outer retroareolar right breast, additional 6 mm in the lower central right breast, 13 mm focal asymmetry in the upper outer right breast.  Left breast no concerns.  Ultrasound showed at 7:00 and retroareolar position intraductal mass measuring 12 x 4 x 4.  At 7:00 5 cm from nipple oval mass measures 7 x 3 x 4 mm.  And at 10:00 10 cm from nipple demonstrates normal fibroglandular tissue.  Breast  biopsy pathology DIAGNOSIS:  A. BREAST, RIGHT 7:00 5 CMFN; ULTRASOUND-GUIDED BIOPSY:  - INTRADUCTAL PAPILLOMA WITH FOCAL EPITHELIAL ATYPIA AND SCLEROSIS.   B.  BREAST ASYMMETRY, RIGHT UPPER OUTER STEREOTACTIC BIOPSY:  - PSEUDOANGIOMATOUS STROMAL HYPERPLASIA, STROMAL FIBROSIS, AND COLUMNAR  CELL CHANGE.  - NEGATIVE FOR ATYPIA AND MALIGNANCY.   Comment:  IHC for CK5/6 and ER was performed on part A. The atypical  epithelial  cells are negative for CK5/6 with retained ER expression. 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    s/p right breast lumpectomy by Dr. Aleen Campi on 09/27/2022.     I have reviewed her chart and materials related to her cancer extensively and collaborated history with the patient. Summary of oncologic history is as follows: Oncology History   No history exists.    MEDICAL HISTORY:  Past Medical History:  Diagnosis Date   Anxiety    B12 deficiency    Calculus of gallbladder without cholecystitis without obstruction    GERD (gastroesophageal reflux disease)    no meds   History of 2019 novel coronavirus disease (COVID-19) 06/20/2019   Intraductal papilloma    right breast   Low ferritin    Numbness and tingling of left side of face    Recurrent umbilical hernia     SURGICAL HISTORY: Past Surgical History:  Procedure Laterality Date   BREAST BIOPSY Right 08/12/2022   u/s guided bx, mass, COIL clip-INTRADUCTAL PAPILLOMA WITH FOCAL SCLEROSIS   BREAST BIOPSY Right 09/02/2022   stereo bx, asymmetry,X Clip X-CLIP) - PSEUDOANGIOMATOUS STROMAL HYPERPLASIA, STROMAL FIBROSIS, AND COLUMNAR CELL CHANGE. - NEGATIVE FOR ATYPIA AND MALIGNANCY   BREAST BIOPSY Right 09/02/2022   u/s bx, mass, RIBBON  NTRADUCTAL PAPILLOMA WITH FOCAL EPITHELIAL ATYPIA AND SCLEROSIS. Comment: IHC for CK5/6 and ER was performed on part A. The atypical epithelial cells are negative for CK5/6 with retained ER expression. 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   BREAST BIOPSY Right 09/02/2022   [place] RT BREAST BX W LOC DEV 1ST LESION IMG BX SPEC US GUIDE 09/02/2022 [facility]   BREAST BIOPSY Right 09/02/2022   MM RT BREAST BX W LOC DEV 1ST LESION IMAGE BX SPEC STEREO GUIDE 09/02/2022 [facility]   BREAST BIOPSY Right 09/19/2022   MM RT RADIO FREQUENCY TAG LOC MAMMO GUIDE 09/19/2022 [facility]   BREAST BIOPSY Right 09/19/2022   MM RT RADIO  FREQUENCY TAG EA ADD LESION LOC MAMMO GUIDE 09/19/2022 [facility]   BREAST LUMPECTOMY WITH RADIOFREQUENCY TAG IDENTIFICATION Right 09/19/2022   2 savi scout place prior to  surgery 09/27/22   BREAST LUMPECTOMY WITH RADIOFREQUENCY TAG IDENTIFICATION Right 09/27/2022   Procedure: BREAST LUMPECTOMY WITH RADIOFREQUENCY TAG IDENTIFICATION;  Surgeon: Henrene Dodge, MD;  Location: [facility];  Service: General;  Laterality: Right;   CHOLECYSTECTOMY  2018   HERNIA REPAIR  2014   Umbical   INSERTION OF MESH  02/09/2021   Procedure: INSERTION OF MESH;  Surgeon: Henrene Dodge, MD;  Location: [facility];  Service: General;;   TUBAL LIGATION      SOCIAL HISTORY: Social History   Socioeconomic History   Marital status: Married    Spouse name: Ann   Number of children: 1   Years of education: Not on file   Highest education level: Not on file  Occupational History   Not on file  Tobacco Use   Smoking status: Former    Current packs/day: 0.00    Types: Cigarettes    Quit date: 2010  Years since quitting: 14.6    Passive exposure: Past   Smokeless tobacco: Never  Vaping Use   Vaping status: Never Used  Substance and Sexual Activity   Alcohol use: Yes    Comment: occasional   Drug use: Never   Sexual activity: Yes    Birth control/protection: None, Surgical    Comment: Tubal ligation  Other Topics Concern   Not on file  Social History Narrative   Not on file   Social Determinants of Health   Financial Resource Strain: Not on file  Food Insecurity: Low Risk  (05/05/2022)   [facility]   Food vital sign    Within the past 12 months, you worried that your food would run out before you got money to buy more: Never true    Within the past 12 months, the food you bought just didn't last and you didn't have money to get more. : Never true  Transportation Needs: Not on file (05/05/2022)  Physical Activity: Not on file  Stress: Not on file  Social  Connections: Not on file  Intimate Partner Violence: Not on file    FAMILY HISTORY: Family History  Problem Relation Age of Onset   Healthy Mother    Healthy Father    Healthy Brother    Breast cancer Maternal Grandmother 68    ALLERGIES:  is allergic to requip [ropinirole], sertraline, and vyvanse [lisdexamfetamine].  MEDICATIONS:  Current Outpatient Medications  Medication Sig Dispense Refill   acetaminophen (TYLENOL) 500 MG tablet Take 2 tablets (1,000 mg total) by mouth every 6 (six) hours as needed for mild pain.     ALPRAZolam (XANAX) 0.25 MG tablet Take 1 tablet (0.25 mg total) by mouth 2 (two) times daily as needed for anxiety. 20 tablet 0   cyanocobalamin 1000 MCG tablet Take 1,000 mcg by mouth daily.     escitalopram (LEXAPRO) 20 MG tablet Take 20 mg by mouth every morning. [name]     ferrous sulfate 324 (65 Fe) MG TBEC Take by mouth.     ibuprofen (ADVIL) 800 MG tablet Take 1 tablet (800 mg total) by mouth every 8 (eight) hours as needed for moderate pain. 60 tablet 1   metFORMIN (GLUCOPHAGE) 500 MG tablet Take 500 mg by mouth daily.     Multiple Vitamin (MULTIVITAMIN) tablet Take 1 tablet by mouth daily.     tamoxifen (NOLVADEX) 20 MG tablet Take 1 tablet (20 mg total) by mouth daily. 90 tablet 0   traZODone (DESYREL) 50 MG tablet Take 50-100 mg by mouth at bedtime as needed. sisk     VITAMIN D PO Take 1 capsule by mouth 3 (three) times a week.     oxyCODONE (OXY IR/ROXICODONE) 5 MG immediate release tablet Take 1 tablet (5 mg total) by mouth every 4 (four) hours as needed for severe pain. (Patient not taking: Reported on 10/18/2022) 30 tablet 0   No current facility-administered medications for this visit.    REVIEW OF SYSTEMS:   Pertinent information mentioned in HPI All other systems were reviewed with the patient and are negative.  PHYSICAL EXAMINATION: ECOG PERFORMANCE STATUS: 0 - Asymptomatic  Vitals:   10/18/22 1450  BP: 125/82  Pulse: 81  Temp:  99.3 F (37.4 C)  SpO2: 99%   Filed Weights   10/18/22 1450  Weight: 186 lb 9.6 oz (84.6 kg)    GENERAL:alert, no distress and comfortable SKIN: skin color, texture, turgor are normal, no rashes  or significant lesions EYES: normal, conjunctiva are pink and non-injected, sclera clear OROPHARYNX:no exudate, no erythema and lips, buccal mucosa, and tongue normal  NECK: supple, thyroid normal size, non-tender, without nodularity LYMPH:  no palpable lymphadenopathy in the cervical, axillary or inguinal LUNGS: clear to auscultation and percussion with normal breathing effort HEART: regular rate & rhythm and no murmurs and no lower extremity edema ABDOMEN:abdomen soft, non-tender and normal bowel sounds Musculoskeletal:no cyanosis of digits and no clubbing  PSYCH: alert & oriented x 3 with fluent speech NEURO: no focal motor/sensory deficits  LABORATORY DATA:  I have reviewed the data as listed Lab Results  Component Value Date   WBC 6.8 11/21/2019   HGB 12.4 11/21/2019   HCT 37.4 11/21/2019   MCV 88.8 11/21/2019   PLT 271 11/21/2019   No results for input(s): "NA", "K", "CL", "CO2", "GLUCOSE", "BUN", "CREATININE", "CALCIUM", "GFRNONAA", "GFRAA", "PROT", "ALBUMIN", "AST", "ALT", "ALKPHOS", "BILITOT", "BILIDIR", "IBILI" in the last 8760 hours.  RADIOGRAPHIC STUDIES: I have personally reviewed the radiological images as listed and agreed with the findings in the report. MM Breast Surgical Specimen  Result Date: 09/27/2022 CLINICAL DATA:  Status post Savi Scout localized right breast three-site excision EXAM: SPECIMEN RADIOGRAPH OF THE RIGHT BREAST COMPARISON:  Previous exam(s). FINDINGS: Status post three-site excision of the right breast. Three Savi Scout reflectors, a coil clip, and a ribbon clip are present within the three provided specimens. IMPRESSION: Specimen radiograph of the right breast. Electronically Signed   By: Jacob Moores M.D.   On: 09/27/2022 12:47   MM RT RADIO  FREQUENCY TAG LOC MAMMO GUIDE  Result Date: 09/19/2022 CLINICAL DATA:  Patient is status post stereotactic guided biopsy of a RIGHT breast asymmetry demonstrating PASH and fibrosis (x clip, displaced). Patient is status post ultrasound-guided biopsy of a RIGHT breast mass which demonstrated a papilloma with focal ADH (RIBBON clip). SAVI was previously placed at an additional papilloma (COIL clip). EXAM: NEEDLE LOCALIZATION OF THE RIGHT BREAST WITH MAMMO GUIDANCE x2 COMPARISON:  Previous exam(s). FINDINGS: Patient presents for needle localization prior to excisional biopsy. I met with the patient and we discussed the procedure of needle localization including benefits and alternatives. We discussed the high likelihood of a successful procedure. We discussed the risks of the procedure, including infection, bleeding, tissue injury, and further surgery. Informed, written consent was given. The usual time-out protocol was performed immediately prior to the procedure. Pre localization images were obtained. This confirmed superior displacement of the X clip from the asymmetry of mammographic concern. As such, the asymmetry itself is targeted with stereotactic guidance. Using stereotactic mammographic guidance, sterile technique, 1% lidocaine and a 10 cm SAVI SCOUT needle, the RIBBON clip was localized using a lateral approach. The images were marked for Dr. Aleen Campi. Using stereotactic mammographic guidance, sterile technique, 1% lidocaine and a 10 cm SAVI SCOUT needle, the ASYMMETRY was localized using a lateral approach. The images were marked for Dr. Aleen Campi. IMPRESSION: 1. Radar reflector localization of the RIGHT breast at site of papilloma with ADH (RIBBON clip). No apparent complications. 2. Radar reflector localization of the RIGHT breast at site of an asymmetry. SAVI is located in the lateral aspect of the asymmetry. No apparent complications. 3. Please note that the X clip is displaced from the asymmetry and  likely will not be present in the surgical specimen. Electronically Signed   By: Meda Klinefelter M.D.   On: 09/19/2022 16:40  MM RT RADIO FREQUENCY TAG EA ADD LESION LOC MAMMO GUIDE  Result Date: 09/19/2022 CLINICAL DATA:  Patient is status post stereotactic guided biopsy of a RIGHT breast asymmetry demonstrating PASH and fibrosis (x clip, displaced). Patient is status post ultrasound-guided biopsy of a RIGHT breast mass which demonstrated a papilloma with focal ADH (RIBBON clip). SAVI was previously placed at an additional papilloma (COIL clip). EXAM: NEEDLE LOCALIZATION OF THE RIGHT BREAST WITH MAMMO GUIDANCE x2 COMPARISON:  Previous exam(s). FINDINGS: Patient presents for needle localization prior to excisional biopsy. I met with the patient and we discussed the procedure of needle localization including benefits and alternatives. We discussed the high likelihood of a successful procedure. We discussed the risks of the procedure, including infection, bleeding, tissue injury, and further surgery. Informed, written consent was given. The usual time-out protocol was performed immediately prior to the procedure. Pre localization images were obtained. This confirmed superior displacement of the X clip from the asymmetry of mammographic concern. As such, the asymmetry itself is targeted with stereotactic guidance. Using stereotactic mammographic guidance, sterile technique, 1% lidocaine and a 10 cm SAVI SCOUT needle, the RIBBON clip was localized using a lateral approach. The images were marked for Dr. Aleen Campi. Using stereotactic mammographic guidance, sterile technique, 1% lidocaine and a 10 cm SAVI SCOUT needle, the ASYMMETRY was localized using a lateral approach. The images were marked for Dr. Aleen Campi. IMPRESSION: 1. Radar reflector localization of the RIGHT breast at site of papilloma with ADH (RIBBON clip). No apparent complications. 2. Radar reflector localization of the RIGHT breast at site of an  asymmetry. SAVI is located in the lateral aspect of the asymmetry. No apparent complications. 3. Please note that the X clip is displaced from the asymmetry and likely will not be present in the surgical specimen. Electronically Signed   By: Meda Klinefelter M.D.   On: 09/19/2022 16:40

## 2022-10-18 NOTE — Progress Notes (Signed)
Patient is gaining weight, and is unsure from where it is coming from, she was put on Metformin, but hasn't taken it like it is prescribed due to in and out of hospitals with surgery's and appointments.  Nausea (ongoing for about 16 years) daily, and is thinking that it is from the hernia that was recently found.   Pain started shortly after she had her breast surgery, she rates it low and that is isn't constant pain either.  Chest pain off and on for past 5 years, having hernia surgery which she thinks will take away that chest pain.

## 2022-10-21 ENCOUNTER — Ambulatory Visit
Admission: RE | Admit: 2022-10-21 | Discharge: 2022-10-21 | Disposition: A | Discharge status: Home / Self Care | Payer: Medicaid Other | Source: Ambulatory Visit | Attending: Gastroenterology | Admitting: Gastroenterology

## 2022-10-21 DIAGNOSIS — R1013 Epigastric pain: Secondary | ICD-10-CM | POA: Insufficient documentation

## 2022-10-21 MED ORDER — TECHNETIUM TC 99M SULFUR COLLOID
2.0700 | Freq: Once | INTRAVENOUS | Status: AC | PRN
  Administered 2022-10-21: 2.07 via INTRAVENOUS

## 2022-10-25 DIAGNOSIS — R142 Eructation: Secondary | ICD-10-CM | POA: Diagnosis not present

## 2022-10-25 DIAGNOSIS — K219 Gastro-esophageal reflux disease without esophagitis: Secondary | ICD-10-CM | POA: Diagnosis not present

## 2022-10-25 DIAGNOSIS — R053 Chronic cough: Secondary | ICD-10-CM | POA: Diagnosis not present

## 2022-11-07 ENCOUNTER — Ambulatory Visit: Payer: Medicaid Other | Admitting: Surgery

## 2022-11-07 ENCOUNTER — Encounter: Payer: Self-pay | Admitting: Surgery

## 2022-11-07 VITALS — BP 120/80 | HR 63 | Temp 98.0°F | Ht 64.0 in | Wt 184.0 lb

## 2022-11-07 DIAGNOSIS — K449 Diaphragmatic hernia without obstruction or gangrene: Secondary | ICD-10-CM

## 2022-11-07 DIAGNOSIS — K219 Gastro-esophageal reflux disease without esophagitis: Secondary | ICD-10-CM | POA: Diagnosis not present

## 2022-11-07 DIAGNOSIS — R1013 Epigastric pain: Secondary | ICD-10-CM

## 2022-11-07 NOTE — Patient Instructions (Signed)
We will get you scheduled for a Barium Swallow and an updated CT scan of the abdomen and pelvis.  We will have you follow up here after we get your results of these tests.

## 2022-11-07 NOTE — Progress Notes (Unsigned)
Patient ID: Lauren Boyd, female   DOB: Nov 23, 1980, 42 y.o.   MRN: [id number]  HPI Lauren Boyd is a 42 y.o. female seen in consultation for hiatal hernia, reflux.  She reports she endorses significant nausea that has been present for the last 15 to 16 years. She reports significant reflux symptoms in the form of heartburn.  She needs to take some PPI with only partial relief.  She is interested in weaning off medications and is very curious about antireflux procedure. She did have a history of cholecystectomy several years ago and most recently Rob Recurrent [facility] repair by Dr. Aleen Campi 11/29 2022 EGD 08/20/20- Dr Mia Creek- normal, Esophagus/stomach/duodenum. There was no H pylori, Barretts, dysplasia/malignancy. There was no EoE or other esophageal abnormalities.  Her esophageal manometry shows changes consistent with hiatal hernia, otherwise nml She did have a CT scan of the abdomen pelvis that  I have personally reviewed showing a very small hiatal hernia.  There is prior evidence of an umbilical hernia. Gastric emptying was nml  HPI  Past Medical History:  Diagnosis Date   Anxiety    B12 deficiency    Calculus of gallbladder without cholecystitis without obstruction    GERD (gastroesophageal reflux disease)    no meds   History of 2019 novel coronavirus disease (COVID-19) 06/20/2019   Intraductal papilloma    right breast   Low ferritin    Numbness and tingling of left side of face    Recurrent umbilical hernia     Past Surgical History:  Procedure Laterality Date   BREAST BIOPSY Right 08/12/2022   u/s guided bx, mass, COIL clip-INTRADUCTAL PAPILLOMA WITH FOCAL SCLEROSIS   BREAST BIOPSY Right 09/02/2022   stereo bx, asymmetry,X Clip X-CLIP) - PSEUDOANGIOMATOUS STROMAL HYPERPLASIA, STROMAL FIBROSIS, AND COLUMNAR CELL CHANGE. - NEGATIVE FOR ATYPIA AND MALIGNANCY   BREAST BIOPSY Right 09/02/2022   u/s bx, mass, RIBBON  NTRADUCTAL PAPILLOMA WITH FOCAL EPITHELIAL ATYPIA AND  SCLEROSIS. Comment: IHC for CK5/6 and ER was performed on part A. The atypical epithelial cells are negative for CK5/6 with retained ER expression. 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   BREAST BIOPSY Right 09/02/2022   [place] RT BREAST BX W LOC DEV 1ST LESION IMG BX SPEC US GUIDE 09/02/2022 [facility]   BREAST BIOPSY Right 09/02/2022   MM RT BREAST BX W LOC DEV 1ST LESION IMAGE BX SPEC STEREO GUIDE 09/02/2022 [facility]   BREAST BIOPSY Right 09/19/2022   MM RT RADIO FREQUENCY TAG LOC MAMMO GUIDE 09/19/2022 [facility]   BREAST BIOPSY Right 09/19/2022   MM RT RADIO FREQUENCY TAG EA ADD LESION LOC MAMMO GUIDE 09/19/2022 [facility]   BREAST LUMPECTOMY WITH RADIOFREQUENCY TAG IDENTIFICATION Right 09/19/2022   2 savi scout place prior to  surgery 09/27/22   BREAST LUMPECTOMY WITH RADIOFREQUENCY TAG IDENTIFICATION Right 09/27/2022   Procedure: BREAST LUMPECTOMY WITH RADIOFREQUENCY TAG IDENTIFICATION;  Surgeon: Henrene Dodge, MD;  Location: [facility];  Service: General;  Laterality: Right;   CHOLECYSTECTOMY  2018   HERNIA REPAIR  2014   Umbical   INSERTION OF MESH  02/09/2021   Procedure: INSERTION OF MESH;  Surgeon: Henrene Dodge, MD;  Location: [facility];  Service: General;;   TUBAL LIGATION      Family History  Problem Relation Age of Onset   Healthy Mother    Healthy Father    Healthy Brother    Breast cancer Maternal Grandmother 38    Social History Social History   Tobacco Use   Smoking  status: Former    Current packs/day: 0.00    Types: Cigarettes    Quit date: 2010    Years since quitting: 14.6    Passive exposure: Past   Smokeless tobacco: Never  Vaping Use   Vaping status: Never Used  Substance Use Topics   Alcohol use: Yes    Comment: occasional   Drug use: Never    Allergies  Allergen Reactions   Requip [Ropinirole]     Headache/ migraine   Sertraline     Talking to self and "doing things I didn't realize I was doing"    Vyvanse [Lisdexamfetamine]     Chewing lips and crazy thoughts    Current Outpatient Medications  Medication Sig Dispense Refill   acetaminophen (TYLENOL) 500 MG tablet Take 2 tablets (1,000 mg total) by mouth every 6 (six) hours as needed for mild pain.     cyanocobalamin 1000 MCG tablet Take 1,000 mcg by mouth daily.     escitalopram (LEXAPRO) 20 MG tablet Take 20 mg by mouth every morning. [name]     ferrous sulfate 324 (65 Fe) MG TBEC Take by mouth.     ibuprofen (ADVIL) 800 MG tablet Take 1 tablet (800 mg total) by mouth every 8 (eight) hours as needed for moderate pain. 60 tablet 1   metFORMIN (GLUCOPHAGE) 500 MG tablet Take 500 mg by mouth daily.     Multiple Vitamin (MULTIVITAMIN) tablet Take 1 tablet by mouth daily.     tamoxifen (NOLVADEX) 20 MG tablet Take 1 tablet (20 mg total) by mouth daily. 90 tablet 0   VITAMIN D PO Take 1 capsule by mouth 3 (three) times a week.     No current facility-administered medications for this visit.     Review of Systems Full ROS  was asked and was negative except for the information on the HPI  Physical Exam Blood pressure 120/80, pulse 63, temperature 98 F (36.7 C), height 5\' 4"  (1.626 m), weight 184 lb (83.5 kg), last menstrual period 10/31/2022, SpO2 98%. CONSTITUTIONAL: NAD. EYES: Pupils are equal, round,  Sclera are non-icteric. EARS, NOSE, MOUTH AND THROAT: The oropharynx is clear. The oral mucosa is pink and moist. Hearing is intact to voice. LYMPH NODES:  Lymph nodes in the neck are normal. RESPIRATORY:  Lungs are clear. There is normal respiratory effort, with equal breath sounds bilaterally, and without pathologic use of accessory muscles. CARDIOVASCULAR: Heart is regular without murmurs, gallops, or rubs. GI: The abdomen is  soft, nontender, and nondistended. There are no palpable masses. There is no hepatosplenomegaly. There are normal bowel sounds in all quadrants. GU: Rectal deferred.   MUSCULOSKELETAL: Normal  muscle strength and tone. No cyanosis or edema.   SKIN: Turgor is good and there are no pathologic skin lesions or ulcers. NEUROLOGIC: Motor and sensation is grossly normal. Cranial nerves are grossly intact. PSYCH:  Oriented to person, place and time. Affect is normal.  Data Reviewed  I have personally reviewed the patient's imaging, laboratory findings and medical records.    Assessment/Plan 42 year old female with significant reflux and a small  hiatal hernia.  Discussed with patient in detail about her disease process.  I was very candid with her that she does have some chronic nausea and functional GI issues that are not necessarily explained by her hiatal hernia.  I discussed with her options of therapy to include medical management versus Nissen fundoplication and repair of her hiatal hernia.  I also discussed with her and  realistic expectations the usual postoperative course as well as the risk benefits and possible complications of surgical intervention.  She seems to be interested in an antireflux surgery.  Will go ahead and continue to work With a CT scan of the abdomen and pelvis as well as a barium swallow.  I spent greater than 45 minutes in this encounter including personal review medical records, coordination of care, placing orders, reviewing imaging studies and performing documentation  Sterling Big, MD FACS General Surgeon 11/07/2022, 1:52 PM

## 2022-11-08 ENCOUNTER — Telehealth: Payer: Self-pay

## 2022-11-08 NOTE — Telephone Encounter (Signed)
Call to patient regarding CT and Barium Swallow. The patient is scheduled for a CT and Barium swallow at [facility] on 11/22/22. She will need to arrive [facility] at 9:15 am and have nothing to eat or drink for 3 hours prior.

## 2022-11-22 ENCOUNTER — Ambulatory Visit
Admission: RE | Admit: 2022-11-22 | Discharge: 2022-11-22 | Disposition: A | Discharge status: Home / Self Care | Payer: Medicaid Other | Source: Ambulatory Visit | Attending: Surgery | Admitting: Surgery

## 2022-11-22 DIAGNOSIS — R1013 Epigastric pain: Secondary | ICD-10-CM | POA: Diagnosis present

## 2022-11-22 DIAGNOSIS — K449 Diaphragmatic hernia without obstruction or gangrene: Secondary | ICD-10-CM

## 2022-11-22 MED ORDER — IOHEXOL 350 MG/ML SOLN
75.0000 mL | Freq: Once | INTRAVENOUS | Status: AC | PRN
  Administered 2022-11-22: 75 mL via INTRAVENOUS

## 2022-11-23 ENCOUNTER — Ambulatory Visit: Payer: Medicaid Other | Admitting: Surgery

## 2022-11-28 ENCOUNTER — Encounter: Payer: Self-pay | Admitting: Surgery

## 2022-11-28 ENCOUNTER — Ambulatory Visit: Payer: Medicaid Other | Admitting: Surgery

## 2022-11-28 VITALS — BP 131/79 | HR 67 | Temp 97.9°F | Ht 64.0 in | Wt 184.6 lb

## 2022-11-28 DIAGNOSIS — K219 Gastro-esophageal reflux disease without esophagitis: Secondary | ICD-10-CM | POA: Diagnosis not present

## 2022-11-28 DIAGNOSIS — K449 Diaphragmatic hernia without obstruction or gangrene: Secondary | ICD-10-CM | POA: Diagnosis not present

## 2022-11-28 NOTE — Patient Instructions (Signed)
Our surgery scheduler Britta Mccreedy will call you within 24-48 hours to get you scheduled. If you have not heard from her after 48 hours, please call our office. Have the blue sheet available when she calls to write down important information.   If you have any concerns or questions, please feel free to call our office.   Laparoscopic Nissen Fundoplication  Laparoscopic Nissen fundoplication is a surgery to relieve heartburn and other problems caused by fluid from your stomach (gastric fluids) flowing up into your esophagus. The esophagus is the part of the body that moves food from the mouth to the stomach. Normally, the muscle that sits between the stomach and the esophagus (lower esophageal sphincter, LES) keeps stomach fluids in the stomach. In some people, the LES does not work properly, and stomach fluids flow up into the esophagus (reflux). This can happen when part of the stomach bulges through the LES (hiatal hernia). The backward flow of stomach fluids can cause a type of severe and long-lasting heartburn that is called gastroesophageal reflux disease (GERD). You may need this surgery if other treatments for GERD have not helped. In this procedure, the upper part of your stomach is wrapped around the lower end of your esophagus and stitched together (sutured). This tightens the connection between your esophagus and stomach to prevent stomach acid reflux. Tell a health care provider about: Any allergies you have. All medicines you are taking, including vitamins, herbs, eye drops, creams, and over-the-counter medicines. Any problems you or family members have had with anesthetic medicines. Any blood disorders you have. Any surgeries you have had. Any medical conditions you have. Whether you are pregnant or may be pregnant. What are the risks? Generally, this is a safe procedure. However, problems may occur, including: Infection. Bleeding. Damage to other structures or organs. This can include  damage to the lung, causing a collapsed lung. Trouble swallowing (dysphagia). Blood clots. Allergic reactions to medicines. What happens before the procedure? Staying hydrated Follow instructions from your health care provider about hydration, which may include: Up to two hours before the procedure - you may continue to drink clear liquids, such as water, clear fruit juice, black coffee and plain tea. Eating and drinking restrictions Follow instructions from your health care provider about eating and drinking, which may include: 8 hours before the procedure - stop eating heavy meals or foods, such as meat, fried foods, or fatty foods. 6 hours before the procedure - stop eating light meals or foods, such as toast or cereal. 6 hours before the procedure - stop drinking milk or drinks that contain milk. 2 hours before the procedure - stop drinking clear liquids. Medicines Ask your health care provider about: Changing or stopping your regular medicines. This is especially important if you are taking diabetes medicines or blood thinners. Taking medicines such as aspirin and ibuprofen. These medicines can thin your blood. Do not take these medicines unless your health care provider tells you to take them. Taking over-the-counter medicines, vitamins, herbs, and supplements. Tests Your health care provider will do tests to plan the procedure. This may include: An exam using a flexible scope passed down your esophagus into your stomach (endoscopy). Imaging studies. General instructions Plan to have a responsible adult take you home from the hospital or clinic. Ask your health care provider: How your surgery site will be marked. What steps will be taken to help prevent infection. These steps may include: Removing hair at the surgery site. Washing skin with a germ-killing soap.  Taking antibiotic medicine. Do not use any products that contain nicotine or tobacco for at least 4 weeks before the  procedure. These products include cigarettes, chewing tobacco, and vaping devices, such as e-cigarettes. If you need help quitting, ask your health care provider. What happens during the procedure? An IV will be inserted into one of your veins. You will be given medicine in your IV to help you relax (sedative) just before the procedure and a medicine to make you fall asleep (general anesthetic). You may have a tube placed through your nose into your stomach to drain stomach acid during the procedure (nasogastric tube). The surgeon will make a small incision in your abdomen and insert a tube through the incision. Your abdomen will be filled with a gas. This helps the surgeon see your organs better, and it makes more space to work. The surgeon will insert a thin, lighted tube (laparoscope) through the small incision. This allows your surgeon to see into your abdomen. The surgeon will make several other small incisions in your abdomen to insert the other instruments that are needed during the procedure. Another instrument (dilator) will be passed through your mouth and down your esophagus into the upper part of your stomach. The dilator will prevent your LES from being closed too tightly during surgery. The upper part of your stomach will be wrapped around the lower part of your esophagus and will be stitched into place. This will strengthen the lower esophageal sphincter and prevent reflux. If you have a hiatal hernia, it will be repaired. The gas will be released from your abdomen. All instruments will be removed, and the incisions will be closed with stitches (sutures). A bandage (dressing) will be placed on your skin over the incisions. The procedure may vary among health care providers and hospitals. What happens after the procedure? Your blood pressure, heart rate, breathing rate, and blood oxygen level will be monitored until you leave the hospital or clinic. You will be given pain medicine as  needed. Your IV will be kept in until you are able to drink fluids. You will be encouraged to get up and walk around as soon as possible. Summary Laparoscopic Nissen fundoplication is a surgery to relieve heartburn and other problems caused by gastric fluids flowing up into your esophagus. You may need this surgery if other treatments for GERD have not helped. Follow instructions from your health care provider about eating and drinking before the procedure. Your surgeon will use a thin, lighted tube (laparoscope) that is inserted through a small incision, allowing the surgeon to see into your abdomen. This information is not intended to replace advice given to you by your health care provider. Make sure you discuss any questions you have with your health care provider. Document Revised: 09/15/2019 Document Reviewed: 09/15/2019 Elsevier Patient Education  2024 [employer].

## 2022-11-28 NOTE — H&P (View-Only) (Signed)
Outpatient Surgical Follow Up  11/28/2022  Lauren Boyd is an 42 y.o. female.   Chief Complaint  Patient presents with   Follow-up    Hiatal hernia    HPI:  Lauren Boyd is a 42 y.o. female seen in F/ufor hiatal hernia, reflux.  She cotninues to endorse significant nausea and epigastric and retrosternal pain. She reports significant reflux symptoms in the form of heartburn.  She needs to take some PPI with only partial relief.  She is interested antireflux procedure to control reflux and to get off PPI. She is also hoping that Nisssen will help with nausea and retrosternal pain. She did have a history of cholecystectomy several years ago and most recently Rob Recurrent [facility] repair by Dr. Aleen Campi 11/29 2022 EGD 08/20/20- Dr Mia Creek- normal, Esophagus/stomach/duodenum. There was no H pylori, No Barretts, dysplasia/malignancy.  Her esophageal manometry shows changes consistent with hiatal hernia, otherwise nml She did have a recent CT scan of the abdomen pelvis that  I have personally reviewed showing a very small hiatal hernia.  There is thickening of the GE junction Gastric emptying was nml Barium swallow showed small hiatal hernia      Past Medical History:  Diagnosis Date   Anxiety    B12 deficiency    Calculus of gallbladder without cholecystitis without obstruction    GERD (gastroesophageal reflux disease)    no meds   History of 2019 novel coronavirus disease (COVID-19) 06/20/2019   Intraductal papilloma    right breast   Low ferritin    Numbness and tingling of left side of face    Recurrent umbilical hernia     Past Surgical History:  Procedure Laterality Date   BREAST BIOPSY Right 08/12/2022   u/s guided bx, mass, COIL clip-INTRADUCTAL PAPILLOMA WITH FOCAL SCLEROSIS   BREAST BIOPSY Right 09/02/2022   stereo bx, asymmetry,X Clip X-CLIP) - PSEUDOANGIOMATOUS STROMAL HYPERPLASIA, STROMAL FIBROSIS, AND COLUMNAR CELL CHANGE. - NEGATIVE FOR ATYPIA AND MALIGNANCY    BREAST BIOPSY Right 09/02/2022   u/s bx, mass, RIBBON  NTRADUCTAL PAPILLOMA WITH FOCAL EPITHELIAL ATYPIA AND SCLEROSIS. Comment: IHC for CK5/6 and ER was performed on part A. The atypical epithelial cells are negative for CK5/6 with retained ER expression. 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   BREAST BIOPSY Right 09/02/2022   [place] RT BREAST BX W LOC DEV 1ST LESION IMG BX SPEC US GUIDE 09/02/2022 [facility]   BREAST BIOPSY Right 09/02/2022   MM RT BREAST BX W LOC DEV 1ST LESION IMAGE BX SPEC STEREO GUIDE 09/02/2022 [facility]   BREAST BIOPSY Right 09/19/2022   MM RT RADIO FREQUENCY TAG LOC MAMMO GUIDE 09/19/2022 [facility]   BREAST BIOPSY Right 09/19/2022   MM RT RADIO FREQUENCY TAG EA ADD LESION LOC MAMMO GUIDE 09/19/2022 [facility]   BREAST LUMPECTOMY WITH RADIOFREQUENCY TAG IDENTIFICATION Right 09/19/2022   2 savi scout place prior to  surgery 09/27/22   BREAST LUMPECTOMY WITH RADIOFREQUENCY TAG IDENTIFICATION Right 09/27/2022   Procedure: BREAST LUMPECTOMY WITH RADIOFREQUENCY TAG IDENTIFICATION;  Surgeon: Henrene Dodge, MD;  Location: [facility];  Service: General;  Laterality: Right;   CHOLECYSTECTOMY  2018   HERNIA REPAIR  2014   Umbical   INSERTION OF MESH  02/09/2021   Procedure: INSERTION OF MESH;  Surgeon: Henrene Dodge, MD;  Location: [facility];  Service: General;;   TUBAL LIGATION      Family History  Problem Relation Age of Onset   Healthy Mother    Healthy Father  Healthy Brother    Breast cancer Maternal Grandmother 36    Social History:  reports that she quit smoking about 14 years ago. Her smoking use included cigarettes. She has been exposed to tobacco smoke. She has never used smokeless tobacco. She reports current alcohol use. She reports that she does not use drugs.  Allergies:  Allergies  Allergen Reactions   Requip [Ropinirole]     Headache/ migraine   Sertraline     Talking to self and "doing things I didn't realize  I was doing"   Vyvanse [Lisdexamfetamine]     Chewing lips and crazy thoughts    Medications reviewed.    ROS Full ROS performed and is otherwise negative other than what is stated in HPI   BP 131/79   Pulse 67   Temp 97.9 F (36.6 C) (Oral)   Ht 5\' 4"  (1.626 m)   Wt 184 lb 9.6 oz (83.7 kg)   LMP 10/31/2022 (Exact Date)   SpO2 98%   BMI 31.69 kg/m   Physical Exam  CONSTITUTIONAL: NAD. EYES: Pupils are equal, round,  Sclera are non-icteric. EARS, NOSE, MOUTH AND THROAT: The oropharynx is clear. The oral mucosa is pink and moist. Hearing is intact to voice. LYMPH NODES:  Lymph nodes in the neck are normal. RESPIRATORY:  Lungs are clear. There is normal respiratory effort, with equal breath sounds bilaterally, and without pathologic use of accessory muscles. CARDIOVASCULAR: Heart is regular without murmurs, gallops, or rubs. GI: The abdomen is  soft, nontender, and nondistended. There are no palpable masses. There is no hepatosplenomegaly. There are normal bowel sounds in all quadrants. GU: Rectal deferred.   MUSCULOSKELETAL: Normal muscle strength and tone. No cyanosis or edema.   SKIN: Turgor is good and there are no pathologic skin lesions or ulcers. NEUROLOGIC: Motor and sensation is grossly normal. Cranial nerves are grossly intact. PSYCH:  Oriented to person, place and time. Affect is normal.   Data Reviewed   I have personally reviewed the patient's imaging, laboratory findings and medical records.     Assessment/Plan 42 year old female significant clinical reflux and a small  hiatal hernia.  Discussed with patient in detail about her disease process.  I was very candid with her that she does have some chronic nausea and functional GI issues that are not necessarily explained by her hiatal hernia.  I discussed with her options of therapy to include medical management versus Nissen fundoplication and repair of her hiatal hernia.  I also discussed with her and realistic  expectations the usual postoperative course as well as the risk benefits and possible complications of surgical intervention.   Currently wishes to try surgical intervention as all other forms of medical interventions have failed her.  SHe understands that even surgery may not address her concerns and she is willing to take that chance. Discussed with the patient in detail.  Risk, benefits and possible complications including but not limited to, dysphagia, esophageal bowel injuries, diarrhea, chronic bloating., infection, bleeding. She understands and wishes to proceed  I spent 40 minutes in this encounter including personal review medical records, coordination of care, placing orders, reviewing imaging studies and performing documentation   Sterling Big, MD [facility] General Surgeon

## 2022-11-28 NOTE — Progress Notes (Signed)
Outpatient Surgical Follow Up  11/28/2022  Lauren Boyd is an 42 y.o. female.   Chief Complaint  Patient presents with   Follow-up    Hiatal hernia    HPI:  Lauren Boyd is a 42 y.o. female seen in F/ufor hiatal hernia, reflux.  She cotninues to endorse significant nausea and epigastric and retrosternal pain. She reports significant reflux symptoms in the form of heartburn.  She needs to take some PPI with only partial relief.  She is interested antireflux procedure to control reflux and to get off PPI. She is also hoping that Nisssen will help with nausea and retrosternal pain. She did have a history of cholecystectomy several years ago and most recently Rob Recurrent [facility] repair by Dr. Aleen Campi 11/29 2022 EGD 08/20/20- Dr Mia Creek- normal, Esophagus/stomach/duodenum. There was no H pylori, No Barretts, dysplasia/malignancy.  Her esophageal manometry shows changes consistent with hiatal hernia, otherwise nml She did have a recent CT scan of the abdomen pelvis that  I have personally reviewed showing a very small hiatal hernia.  There is thickening of the GE junction Gastric emptying was nml Barium swallow showed small hiatal hernia      Past Medical History:  Diagnosis Date   Anxiety    B12 deficiency    Calculus of gallbladder without cholecystitis without obstruction    GERD (gastroesophageal reflux disease)    no meds   History of 2019 novel coronavirus disease (COVID-19) 06/20/2019   Intraductal papilloma    right breast   Low ferritin    Numbness and tingling of left side of face    Recurrent umbilical hernia     Past Surgical History:  Procedure Laterality Date   BREAST BIOPSY Right 08/12/2022   u/s guided bx, mass, COIL clip-INTRADUCTAL PAPILLOMA WITH FOCAL SCLEROSIS   BREAST BIOPSY Right 09/02/2022   stereo bx, asymmetry,X Clip X-CLIP) - PSEUDOANGIOMATOUS STROMAL HYPERPLASIA, STROMAL FIBROSIS, AND COLUMNAR CELL CHANGE. - NEGATIVE FOR ATYPIA AND MALIGNANCY    BREAST BIOPSY Right 09/02/2022   u/s bx, mass, RIBBON  NTRADUCTAL PAPILLOMA WITH FOCAL EPITHELIAL ATYPIA AND SCLEROSIS. Comment: IHC for CK5/6 and ER was performed on part A. The atypical epithelial cells are negative for CK5/6 with retained ER expression. 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   BREAST BIOPSY Right 09/02/2022   [place] RT BREAST BX W LOC DEV 1ST LESION IMG BX SPEC US GUIDE 09/02/2022 [facility]   BREAST BIOPSY Right 09/02/2022   MM RT BREAST BX W LOC DEV 1ST LESION IMAGE BX SPEC STEREO GUIDE 09/02/2022 [facility]   BREAST BIOPSY Right 09/19/2022   MM RT RADIO FREQUENCY TAG LOC MAMMO GUIDE 09/19/2022 [facility]   BREAST BIOPSY Right 09/19/2022   MM RT RADIO FREQUENCY TAG EA ADD LESION LOC MAMMO GUIDE 09/19/2022 [facility]   BREAST LUMPECTOMY WITH RADIOFREQUENCY TAG IDENTIFICATION Right 09/19/2022   2 savi scout place prior to  surgery 09/27/22   BREAST LUMPECTOMY WITH RADIOFREQUENCY TAG IDENTIFICATION Right 09/27/2022   Procedure: BREAST LUMPECTOMY WITH RADIOFREQUENCY TAG IDENTIFICATION;  Surgeon: Henrene Dodge, MD;  Location: [facility];  Service: General;  Laterality: Right;   CHOLECYSTECTOMY  2018   HERNIA REPAIR  2014   Umbical   INSERTION OF MESH  02/09/2021   Procedure: INSERTION OF MESH;  Surgeon: Henrene Dodge, MD;  Location: [facility];  Service: General;;   TUBAL LIGATION      Family History  Problem Relation Age of Onset   Healthy Mother    Healthy Father  Healthy Brother    Breast cancer Maternal Grandmother 36    Social History:  reports that she quit smoking about 14 years ago. Her smoking use included cigarettes. She has been exposed to tobacco smoke. She has never used smokeless tobacco. She reports current alcohol use. She reports that she does not use drugs.  Allergies:  Allergies  Allergen Reactions   Requip [Ropinirole]     Headache/ migraine   Sertraline     Talking to self and "doing things I didn't realize  I was doing"   Vyvanse [Lisdexamfetamine]     Chewing lips and crazy thoughts    Medications reviewed.    ROS Full ROS performed and is otherwise negative other than what is stated in HPI   BP 131/79   Pulse 67   Temp 97.9 F (36.6 C) (Oral)   Ht 5\' 4"  (1.626 m)   Wt 184 lb 9.6 oz (83.7 kg)   LMP 10/31/2022 (Exact Date)   SpO2 98%   BMI 31.69 kg/m   Physical Exam  CONSTITUTIONAL: NAD. EYES: Pupils are equal, round,  Sclera are non-icteric. EARS, NOSE, MOUTH AND THROAT: The oropharynx is clear. The oral mucosa is pink and moist. Hearing is intact to voice. LYMPH NODES:  Lymph nodes in the neck are normal. RESPIRATORY:  Lungs are clear. There is normal respiratory effort, with equal breath sounds bilaterally, and without pathologic use of accessory muscles. CARDIOVASCULAR: Heart is regular without murmurs, gallops, or rubs. GI: The abdomen is  soft, nontender, and nondistended. There are no palpable masses. There is no hepatosplenomegaly. There are normal bowel sounds in all quadrants. GU: Rectal deferred.   MUSCULOSKELETAL: Normal muscle strength and tone. No cyanosis or edema.   SKIN: Turgor is good and there are no pathologic skin lesions or ulcers. NEUROLOGIC: Motor and sensation is grossly normal. Cranial nerves are grossly intact. PSYCH:  Oriented to person, place and time. Affect is normal.   Data Reviewed   I have personally reviewed the patient's imaging, laboratory findings and medical records.     Assessment/Plan 42 year old female significant clinical reflux and a small  hiatal hernia.  Discussed with patient in detail about her disease process.  I was very candid with her that she does have some chronic nausea and functional GI issues that are not necessarily explained by her hiatal hernia.  I discussed with her options of therapy to include medical management versus Nissen fundoplication and repair of her hiatal hernia.  I also discussed with her and realistic  expectations the usual postoperative course as well as the risk benefits and possible complications of surgical intervention.   Currently wishes to try surgical intervention as all other forms of medical interventions have failed her.  SHe understands that even surgery may not address her concerns and she is willing to take that chance. Discussed with the patient in detail.  Risk, benefits and possible complications including but not limited to, dysphagia, esophageal bowel injuries, diarrhea, chronic bloating., infection, bleeding. She understands and wishes to proceed  I spent 40 minutes in this encounter including personal review medical records, coordination of care, placing orders, reviewing imaging studies and performing documentation   Sterling Big, MD [facility] General Surgeon

## 2022-11-29 ENCOUNTER — Telehealth: Payer: Self-pay | Admitting: Surgery

## 2022-11-29 NOTE — Telephone Encounter (Signed)
Left message for patient to call, please inform her of the following regarding scheduled surgery with Dr. Everlene Farrier.   Pre-Admission date/time, and Surgery date at [facility].  Surgery Date: 12/20/22 Preadmission Testing Date: 12/12/22 (phone 1p-4p)  Also patient will need to call at [phone], between 1-3:00pm the day before surgery, to find out what time to arrive for surgery.

## 2022-11-29 NOTE — Telephone Encounter (Signed)
Patient called back and notified of surgery information.

## 2022-12-12 ENCOUNTER — Encounter
Admission: RE | Admit: 2022-12-12 | Discharge: 2022-12-12 | Disposition: A | Discharge status: Home / Self Care | Payer: Medicaid Other | Source: Ambulatory Visit | Attending: Surgery | Admitting: Surgery

## 2022-12-12 VITALS — Ht 64.0 in | Wt 184.5 lb

## 2022-12-12 DIAGNOSIS — Z01818 Encounter for other preprocedural examination: Secondary | ICD-10-CM

## 2022-12-12 HISTORY — DX: Other specified abnormal immunological findings in serum: R76.8

## 2022-12-12 HISTORY — DX: Diaphragmatic hernia without obstruction or gangrene: K44.9

## 2022-12-12 HISTORY — DX: Anemia, unspecified: D64.9

## 2022-12-12 HISTORY — DX: Benign neoplasm of unspecified breast: D24.9

## 2022-12-12 HISTORY — DX: Long term (current) use of selective estrogen receptor modulators (serms): Z79.810

## 2022-12-12 NOTE — Patient Instructions (Signed)
Your procedure is scheduled on:12-20-22 Tuesday Report to the Registration Desk on the 1st floor of the [facility].Then proceed to the 2nd floor Surgery Desk To find out your arrival time, please call [phone] between 1PM - 3PM on:12-19-22 Monday If your arrival time is 6:00 am, do not arrive before that time as the Medical Mall entrance doors do not open until 6:00 am.  REMEMBER: Instructions that are not followed completely may result in serious medical risk, up to and including death; or upon the discretion of your surgeon and anesthesiologist your surgery may need to be rescheduled.  Do not eat food after midnight the night before surgery.  No gum chewing or hard candies.  You may however, drink CLEAR liquids up to 2 hours before you are scheduled to arrive for your surgery. Do not drink anything within 2 hours of your scheduled arrival time.  Clear liquids include: - water  - apple juice without pulp - gatorade (not RED colors) - black coffee or tea (Do NOT add milk or creamers to the coffee or tea) Do NOT drink anything that is not on this list.  One week prior to surgery: Stop Anti-inflammatories (NSAIDS) such as Advil, Aleve, Ibuprofen, Motrin, Naproxen, Naprosyn and Aspirin based products such as Excedrin, Goody's Powder, BC Powder.You may however, take Tylenol if needed for pain up until the day of surgery. Stop ANY OVER THE COUNTER supplements/vitamins NOW (12-12-22) until after surgery (Vitamin B12, Ferrous Sulfate, Multivitamin and Vitamin D)   Continue taking all prescribed medications with the exception of the following: -metFORMIN (GLUCOPHAGE)-Stop 2 days prior to surgery-Last dose will be on 12-17-22 Saturday  TAKE ONLY THESE MEDICATIONS THE MORNING OF SURGERY WITH A SIP OF WATER: -escitalopram (LEXAPRO)  -tamoxifen (NOLVADEX)   No Alcohol for 24 hours before or after surgery.  No Smoking including e-cigarettes for 24 hours before surgery.  No chewable tobacco  products for at least 6 hours before surgery.  No nicotine patches on the day of surgery.  Do not use any "recreational" drugs for at least a week (preferably 2 weeks) before your surgery.  Please be advised that the combination of cocaine and anesthesia may have negative outcomes, up to and including death. If you test positive for cocaine, your surgery will be cancelled.  On the morning of surgery brush your teeth with toothpaste and water, you may rinse your mouth with mouthwash if you wish. Do not swallow any toothpaste or mouthwash  Use Dial Soap at home the night prior to surgery and again the morning of surgery  Do not wear jewelry, make-up, hairpins, clips or nail polish.  For welded (permanent) jewelry: bracelets, anklets, waist bands, etc.  Please have this removed prior to surgery.  If it is not removed, there is a chance that hospital personnel will need to cut it off on the day of surgery.  Do not wear lotions, powders, or perfumes.   Do not shave body hair from the neck down 48 hours before surgery.  Contact lenses, hearing aids and dentures may not be worn into surgery.  Do not bring valuables to the hospital. [facility] is not responsible for any missing/lost belongings or valuables.   Notify your doctor if there is any change in your medical condition (cold, fever, infection).  Wear comfortable clothing (specific to your surgery type) to the hospital.  After surgery, you can help prevent lung complications by doing breathing exercises.  Take deep breaths and cough every 1-2 hours. Your  doctor may order a device called an Incentive Spirometer to help you take deep breaths. When coughing or sneezing, hold a pillow firmly against your incision with both hands. This is called "splinting." Doing this helps protect your incision. It also decreases belly discomfort.  If you are being admitted to the hospital overnight, leave your suitcase in the car. After surgery it may be  brought to your room.  In case of increased patient census, it may be necessary for you, the patient, to continue your postoperative care in the Same Day Surgery department.  If you are being discharged the day of surgery, you will not be allowed to drive home. You will need a responsible individual to drive you home and stay with you for 24 hours after surgery.   If you are taking public transportation, you will need to have a responsible individual with you.  Please call the Pre-admissions Testing Dept. at [phone] if you have any questions about these instructions.  Surgery Visitation Policy:  Patients having surgery or a procedure may have two visitors.  Children under the age of 87 must have an adult with them who is not the patient.  Inpatient Visitation:    Visiting hours are 7 a.m. to 8 p.m. Up to four visitors are allowed at one time in a patient room. The visitors may rotate out with other people during the day.  One visitor age 25 or older may stay with the patient overnight and must be in the room by 8 p.m.

## 2022-12-13 DIAGNOSIS — F4312 Post-traumatic stress disorder, chronic: Secondary | ICD-10-CM | POA: Diagnosis not present

## 2022-12-13 DIAGNOSIS — F331 Major depressive disorder, recurrent, moderate: Secondary | ICD-10-CM | POA: Diagnosis not present

## 2022-12-13 DIAGNOSIS — F411 Generalized anxiety disorder: Secondary | ICD-10-CM | POA: Diagnosis not present

## 2022-12-19 MED ORDER — FAMOTIDINE 20 MG PO TABS
20.0000 mg | ORAL_TABLET | Freq: Once | ORAL | Status: AC
  Administered 2022-12-20: 20 mg via ORAL

## 2022-12-19 MED ORDER — CHLORHEXIDINE GLUCONATE CLOTH 2 % EX PADS
6.0000 | MEDICATED_PAD | Freq: Once | CUTANEOUS | Status: AC
  Administered 2022-12-20: 6 via TOPICAL

## 2022-12-19 MED ORDER — ORAL CARE MOUTH RINSE: 15.0000 mL | Freq: Once | OROMUCOSAL | Status: AC

## 2022-12-19 MED ORDER — CHLORHEXIDINE GLUCONATE CLOTH 2 % EX PADS: 6.0000 | MEDICATED_PAD | Freq: Once | CUTANEOUS | Status: DC

## 2022-12-19 MED ORDER — CELECOXIB 200 MG PO CAPS
200.0000 mg | ORAL_CAPSULE | ORAL | Status: AC
  Administered 2022-12-20: 200 mg via ORAL

## 2022-12-19 MED ORDER — GABAPENTIN 300 MG PO CAPS
300.0000 mg | ORAL_CAPSULE | ORAL | Status: AC
  Administered 2022-12-20: 300 mg via ORAL

## 2022-12-19 MED ORDER — CEFAZOLIN SODIUM-DEXTROSE 2-4 GM/100ML-% IV SOLN
2.0000 g | INTRAVENOUS | Status: AC
  Administered 2022-12-20: 2 g via INTRAVENOUS

## 2022-12-19 MED ORDER — CHLORHEXIDINE GLUCONATE 0.12 % MT SOLN
15.0000 mL | Freq: Once | OROMUCOSAL | Status: AC
  Administered 2022-12-20: 15 mL via OROMUCOSAL

## 2022-12-19 MED ORDER — ACETAMINOPHEN 500 MG PO TABS
1000.0000 mg | ORAL_TABLET | ORAL | Status: AC
  Administered 2022-12-20: 1000 mg via ORAL

## 2022-12-20 ENCOUNTER — Observation Stay
Admission: RE | Admit: 2022-12-20 | Discharge: 2022-12-21 | Disposition: A | Discharge status: Home / Self Care | Payer: Medicaid Other | Attending: Surgery | Admitting: Surgery

## 2022-12-20 ENCOUNTER — Ambulatory Visit: Payer: Medicaid Other | Admitting: Urgent Care

## 2022-12-20 ENCOUNTER — Ambulatory Visit: Payer: Medicaid Other | Admitting: Anesthesiology

## 2022-12-20 ENCOUNTER — Other Ambulatory Visit: Payer: Self-pay

## 2022-12-20 ENCOUNTER — Encounter: Payer: Self-pay | Admitting: Surgery

## 2022-12-20 ENCOUNTER — Encounter
Admission: RE | Disposition: A | Discharge status: Home / Self Care | Payer: Self-pay | Source: Home / Self Care | Attending: Surgery

## 2022-12-20 DIAGNOSIS — K219 Gastro-esophageal reflux disease without esophagitis: Secondary | ICD-10-CM | POA: Diagnosis not present

## 2022-12-20 DIAGNOSIS — Z01818 Encounter for other preprocedural examination: Secondary | ICD-10-CM

## 2022-12-20 DIAGNOSIS — Z8616 Personal history of COVID-19: Secondary | ICD-10-CM | POA: Diagnosis not present

## 2022-12-20 DIAGNOSIS — Z8719 Personal history of other diseases of the digestive system: Principal | ICD-10-CM

## 2022-12-20 DIAGNOSIS — K449 Diaphragmatic hernia without obstruction or gangrene: Principal | ICD-10-CM

## 2022-12-20 DIAGNOSIS — Z87891 Personal history of nicotine dependence: Secondary | ICD-10-CM | POA: Diagnosis not present

## 2022-12-20 HISTORY — PX: XI ROBOTIC ASSISTED PARAESOPHAGEAL HERNIA REPAIR: SHX6871

## 2022-12-20 HISTORY — PX: INSERTION OF MESH: SHX5868

## 2022-12-20 LAB — POCT PREGNANCY, URINE: Preg Test, Ur: NEGATIVE

## 2022-12-20 SURGERY — REPAIR, HERNIA, PARAESOPHAGEAL, ROBOT-ASSISTED
Anesthesia: General

## 2022-12-20 MED ORDER — CEFAZOLIN SODIUM-DEXTROSE 2-4 GM/100ML-% IV SOLN
2.0000 g | Freq: Three times a day (TID) | INTRAVENOUS | Status: AC
  Administered 2022-12-20 – 2022-12-21 (×2): 2 g via INTRAVENOUS

## 2022-12-20 MED ORDER — OXYCODONE HCL 5 MG PO TABS
5.0000 mg | ORAL_TABLET | ORAL | Status: DC | PRN
  Administered 2022-12-20 (×2): 5 mg via ORAL
  Administered 2022-12-20 – 2022-12-21 (×2): 10 mg via ORAL

## 2022-12-20 MED ORDER — DROPERIDOL 2.5 MG/ML IJ SOLN: 0.6250 mg | Freq: Once | INTRAMUSCULAR | Status: DC | PRN

## 2022-12-20 MED ORDER — DEXAMETHASONE SODIUM PHOSPHATE 10 MG/ML IJ SOLN
INTRAMUSCULAR | Status: AC
  Filled 2022-12-20: qty 1

## 2022-12-20 MED ORDER — ACETAMINOPHEN 500 MG PO TABS
ORAL_TABLET | ORAL | Status: AC
  Filled 2022-12-20: qty 2

## 2022-12-20 MED ORDER — FAMOTIDINE 20 MG PO TABS
ORAL_TABLET | ORAL | Status: AC
  Filled 2022-12-20: qty 1

## 2022-12-20 MED ORDER — FLEET ENEMA RE ENEM: 1.0000 | ENEMA | Freq: Once | RECTAL | Status: DC | PRN

## 2022-12-20 MED ORDER — PHENYLEPHRINE HCL-NACL 20-0.9 MG/250ML-% IV SOLN
INTRAVENOUS | Status: DC | PRN
Start: 2022-12-20 — End: 2022-12-20
  Administered 2022-12-20: 30 ug/min via INTRAVENOUS

## 2022-12-20 MED ORDER — CHLORHEXIDINE GLUCONATE 0.12 % MT SOLN
OROMUCOSAL | Status: AC
  Filled 2022-12-20: qty 15

## 2022-12-20 MED ORDER — MORPHINE SULFATE (PF) 4 MG/ML IV SOLN: 2.0000 mg | INTRAVENOUS | Status: DC | PRN

## 2022-12-20 MED ORDER — PHENYLEPHRINE HCL (PRESSORS) 10 MG/ML IV SOLN
INTRAVENOUS | Status: AC
  Filled 2022-12-20: qty 1

## 2022-12-20 MED ORDER — PROPOFOL 10 MG/ML IV BOLUS
INTRAVENOUS | Status: DC | PRN
  Administered 2022-12-20: 160 mg via INTRAVENOUS

## 2022-12-20 MED ORDER — FENTANYL CITRATE (PF) 100 MCG/2ML IJ SOLN
INTRAMUSCULAR | Status: DC | PRN
  Administered 2022-12-20 (×2): 50 ug via INTRAVENOUS

## 2022-12-20 MED ORDER — BUPIVACAINE LIPOSOME 1.3 % IJ SUSP
INTRAMUSCULAR | Status: DC | PRN
  Administered 2022-12-20: 20 mL

## 2022-12-20 MED ORDER — INSULIN ASPART 100 UNIT/ML IJ SOLN: 0.0000 [IU] | Freq: Every day | INTRAMUSCULAR | Status: DC

## 2022-12-20 MED ORDER — OXYCODONE HCL 5 MG PO TABS: 5.0000 mg | ORAL_TABLET | Freq: Once | ORAL | Status: DC | PRN

## 2022-12-20 MED ORDER — SEVOFLURANE IN SOLN
RESPIRATORY_TRACT | Status: AC
  Filled 2022-12-20: qty 250

## 2022-12-20 MED ORDER — OXYCODONE HCL 5 MG PO TABS
ORAL_TABLET | ORAL | Status: AC
  Filled 2022-12-20: qty 1

## 2022-12-20 MED ORDER — KETOROLAC TROMETHAMINE 30 MG/ML IJ SOLN
30.0000 mg | Freq: Four times a day (QID) | INTRAMUSCULAR | Status: DC
  Administered 2022-12-20 – 2022-12-21 (×4): 30 mg via INTRAVENOUS

## 2022-12-20 MED ORDER — PROCHLORPERAZINE MALEATE 10 MG PO TABS: 10.0000 mg | ORAL_TABLET | Freq: Four times a day (QID) | ORAL | Status: DC | PRN

## 2022-12-20 MED ORDER — DEXMEDETOMIDINE HCL IN NACL 80 MCG/20ML IV SOLN
INTRAVENOUS | Status: DC | PRN
Start: 2022-12-20 — End: 2022-12-20
  Administered 2022-12-20: 8 ug via INTRAVENOUS

## 2022-12-20 MED ORDER — ALBUTEROL SULFATE HFA 108 (90 BASE) MCG/ACT IN AERS
INHALATION_SPRAY | RESPIRATORY_TRACT | Status: AC
  Filled 2022-12-20: qty 6.7

## 2022-12-20 MED ORDER — BISACODYL 5 MG PO TBEC: 5.0000 mg | DELAYED_RELEASE_TABLET | Freq: Every day | ORAL | Status: DC | PRN

## 2022-12-20 MED ORDER — ONDANSETRON 4 MG PO TBDP: 4.0000 mg | ORAL_TABLET | Freq: Four times a day (QID) | ORAL | Status: DC | PRN

## 2022-12-20 MED ORDER — KETAMINE HCL 50 MG/5ML IJ SOSY
PREFILLED_SYRINGE | INTRAMUSCULAR | Status: AC
  Filled 2022-12-20: qty 5

## 2022-12-20 MED ORDER — FENTANYL CITRATE (PF) 100 MCG/2ML IJ SOLN
INTRAMUSCULAR | Status: AC
  Filled 2022-12-20: qty 2

## 2022-12-20 MED ORDER — KETOROLAC TROMETHAMINE 30 MG/ML IJ SOLN
INTRAMUSCULAR | Status: AC
  Filled 2022-12-20: qty 1

## 2022-12-20 MED ORDER — DEXMEDETOMIDINE HCL IN NACL 80 MCG/20ML IV SOLN
INTRAVENOUS | Status: AC
  Filled 2022-12-20: qty 20

## 2022-12-20 MED ORDER — PROPOFOL 10 MG/ML IV BOLUS
INTRAVENOUS | Status: AC
  Filled 2022-12-20: qty 20

## 2022-12-20 MED ORDER — DEXAMETHASONE SODIUM PHOSPHATE 10 MG/ML IJ SOLN
INTRAMUSCULAR | Status: DC | PRN
  Administered 2022-12-20: 10 mg via INTRAVENOUS

## 2022-12-20 MED ORDER — GABAPENTIN 300 MG PO CAPS
ORAL_CAPSULE | ORAL | Status: AC
  Filled 2022-12-20: qty 1

## 2022-12-20 MED ORDER — ONDANSETRON HCL 4 MG/2ML IJ SOLN: 4.0000 mg | Freq: Four times a day (QID) | INTRAMUSCULAR | Status: DC | PRN

## 2022-12-20 MED ORDER — BUPIVACAINE LIPOSOME 1.3 % IJ SUSP
INTRAMUSCULAR | Status: AC
  Filled 2022-12-20: qty 20

## 2022-12-20 MED ORDER — ACETAMINOPHEN 500 MG PO TABS
1000.0000 mg | ORAL_TABLET | Freq: Four times a day (QID) | ORAL | Status: DC
  Administered 2022-12-20 – 2022-12-21 (×4): 1000 mg via ORAL

## 2022-12-20 MED ORDER — SUGAMMADEX SODIUM 200 MG/2ML IV SOLN
INTRAVENOUS | Status: DC | PRN
  Administered 2022-12-20: 200 mg via INTRAVENOUS

## 2022-12-20 MED ORDER — ONDANSETRON HCL 4 MG/2ML IJ SOLN
INTRAMUSCULAR | Status: DC | PRN
  Administered 2022-12-20: 4 mg via INTRAVENOUS

## 2022-12-20 MED ORDER — HYDRALAZINE HCL 20 MG/ML IJ SOLN: 10.0000 mg | INTRAMUSCULAR | Status: DC | PRN

## 2022-12-20 MED ORDER — LIDOCAINE HCL (CARDIAC) PF 100 MG/5ML IV SOSY
PREFILLED_SYRINGE | INTRAVENOUS | Status: DC | PRN
  Administered 2022-12-20: 80 mg via INTRAVENOUS

## 2022-12-20 MED ORDER — OXYCODONE HCL 5 MG/5ML PO SOLN: 5.0000 mg | Freq: Once | ORAL | Status: DC | PRN

## 2022-12-20 MED ORDER — BUPIVACAINE-EPINEPHRINE (PF) 0.25% -1:200000 IJ SOLN
INTRAMUSCULAR | Status: AC
  Filled 2022-12-20: qty 30

## 2022-12-20 MED ORDER — KETAMINE HCL 50 MG/5ML IJ SOSY
PREFILLED_SYRINGE | INTRAMUSCULAR | Status: DC | PRN
Start: 2022-12-20 — End: 2022-12-20
  Administered 2022-12-20: 50 mg via INTRAVENOUS

## 2022-12-20 MED ORDER — DIPHENHYDRAMINE HCL 12.5 MG/5ML PO ELIX: 12.5000 mg | ORAL_SOLUTION | Freq: Four times a day (QID) | ORAL | Status: DC | PRN

## 2022-12-20 MED ORDER — INSULIN ASPART 100 UNIT/ML IJ SOLN: 4.0000 [IU] | Freq: Three times a day (TID) | INTRAMUSCULAR | Status: DC

## 2022-12-20 MED ORDER — MIDAZOLAM HCL 2 MG/2ML IJ SOLN
INTRAMUSCULAR | Status: DC | PRN
  Administered 2022-12-20: 2 mg via INTRAVENOUS

## 2022-12-20 MED ORDER — LACTATED RINGERS IV SOLN: INTRAVENOUS | Status: DC

## 2022-12-20 MED ORDER — CELECOXIB 200 MG PO CAPS
ORAL_CAPSULE | ORAL | Status: AC
  Filled 2022-12-20: qty 1

## 2022-12-20 MED ORDER — CEFAZOLIN SODIUM-DEXTROSE 2-4 GM/100ML-% IV SOLN
INTRAVENOUS | Status: AC
  Filled 2022-12-20: qty 100

## 2022-12-20 MED ORDER — OXYCODONE HCL 5 MG PO TABS
ORAL_TABLET | ORAL | Status: AC
  Filled 2022-12-20: qty 2

## 2022-12-20 MED ORDER — PROCHLORPERAZINE EDISYLATE 10 MG/2ML IJ SOLN: 5.0000 mg | Freq: Four times a day (QID) | INTRAMUSCULAR | Status: DC | PRN

## 2022-12-20 MED ORDER — ENOXAPARIN SODIUM 40 MG/0.4ML IJ SOSY
40.0000 mg | PREFILLED_SYRINGE | INTRAMUSCULAR | Status: DC
  Administered 2022-12-21: 40 mg via SUBCUTANEOUS

## 2022-12-20 MED ORDER — INSULIN ASPART 100 UNIT/ML IJ SOLN: 0.0000 [IU] | Freq: Three times a day (TID) | INTRAMUSCULAR | Status: DC

## 2022-12-20 MED ORDER — ROCURONIUM BROMIDE 100 MG/10ML IV SOLN
INTRAVENOUS | Status: DC | PRN
  Administered 2022-12-20: 30 mg via INTRAVENOUS
  Administered 2022-12-20: 50 mg via INTRAVENOUS
  Administered 2022-12-20: 20 mg via INTRAVENOUS

## 2022-12-20 MED ORDER — SODIUM CHLORIDE 0.9 % IV SOLN: INTRAVENOUS | Status: DC

## 2022-12-20 MED ORDER — BUPIVACAINE-EPINEPHRINE 0.25% -1:200000 IJ SOLN
INTRAMUSCULAR | Status: DC | PRN
  Administered 2022-12-20: 30 mL

## 2022-12-20 MED ORDER — VISTASEAL 10 ML SINGLE DOSE KIT
PACK | CUTANEOUS | Status: AC
  Filled 2022-12-20: qty 10

## 2022-12-20 MED ORDER — MIDAZOLAM HCL 2 MG/2ML IJ SOLN
INTRAMUSCULAR | Status: AC
  Filled 2022-12-20: qty 2

## 2022-12-20 MED ORDER — HYDROMORPHONE HCL 1 MG/ML IJ SOLN
INTRAMUSCULAR | Status: AC
  Filled 2022-12-20: qty 1

## 2022-12-20 MED ORDER — KETOROLAC TROMETHAMINE 30 MG/ML IJ SOLN
INTRAMUSCULAR | Status: DC | PRN
  Administered 2022-12-20: 30 mg via INTRAVENOUS

## 2022-12-20 MED ORDER — PREGABALIN 50 MG PO CAPS
ORAL_CAPSULE | ORAL | Status: AC
  Filled 2022-12-20: qty 2

## 2022-12-20 MED ORDER — HYDROMORPHONE HCL 1 MG/ML IJ SOLN
0.2500 mg | INTRAMUSCULAR | Status: DC | PRN
  Administered 2022-12-20 (×2): 0.25 mg via INTRAVENOUS

## 2022-12-20 MED ORDER — LIDOCAINE HCL (PF) 2 % IJ SOLN
INTRAMUSCULAR | Status: AC
  Filled 2022-12-20: qty 5

## 2022-12-20 MED ORDER — ONDANSETRON HCL 4 MG/2ML IJ SOLN
INTRAMUSCULAR | Status: AC
  Filled 2022-12-20: qty 2

## 2022-12-20 MED ORDER — DIPHENHYDRAMINE HCL 50 MG/ML IJ SOLN: 12.5000 mg | Freq: Four times a day (QID) | INTRAMUSCULAR | Status: DC | PRN

## 2022-12-20 MED ORDER — VISTASEAL 10 ML SINGLE DOSE KIT
PACK | CUTANEOUS | Status: DC | PRN
Start: 2022-12-20 — End: 2022-12-20
  Administered 2022-12-20: 10 mL via TOPICAL

## 2022-12-20 MED ORDER — PREGABALIN 50 MG PO CAPS
100.0000 mg | ORAL_CAPSULE | Freq: Once | ORAL | Status: AC
  Administered 2022-12-20: 100 mg via ORAL

## 2022-12-20 MED ORDER — ROCURONIUM BROMIDE 10 MG/ML (PF) SYRINGE
PREFILLED_SYRINGE | INTRAVENOUS | Status: AC
  Filled 2022-12-20: qty 10

## 2022-12-20 MED ORDER — ESCITALOPRAM OXALATE 20 MG PO TABS
20.0000 mg | ORAL_TABLET | Freq: Every day | ORAL | Status: DC
  Administered 2022-12-21: 20 mg via ORAL
  Filled 2022-12-20: qty 1

## 2022-12-20 SURGICAL SUPPLY — 56 items
ADH SKN CLS APL DERMABOND .7 (GAUZE/BANDAGES/DRESSINGS) ×2
APL LAPSCP 35 DL APL RGD (MISCELLANEOUS)
APPLICATOR VISTASEAL 35 (MISCELLANEOUS) IMPLANT
CANNULA REDUCER 12-8 DVNC XI (CANNULA) ×2 IMPLANT
CLIP LIGATING HEM O LOK PURPLE (MISCELLANEOUS) IMPLANT
DERMABOND ADVANCED .7 DNX12 (GAUZE/BANDAGES/DRESSINGS) ×2 IMPLANT
DRAPE ARM DVNC X/XI (DISPOSABLE) ×8 IMPLANT
DRAPE COLUMN DVNC XI (DISPOSABLE) ×2 IMPLANT
ELECT CAUTERY BLADE 6.4 (BLADE) ×2 IMPLANT
ELECT REM PT RETURN 9FT ADLT (ELECTROSURGICAL) ×2
ELECTRODE REM PT RTRN 9FT ADLT (ELECTROSURGICAL) ×2 IMPLANT
FORCEPS BPLR R/ABLATION 8 DVNC (INSTRUMENTS) ×2 IMPLANT
GLOVE BIO SURGEON STRL SZ7 (GLOVE) ×6 IMPLANT
GOWN STRL REUS W/ TWL LRG LVL3 (GOWN DISPOSABLE) ×8 IMPLANT
GOWN STRL REUS W/TWL LRG LVL3 (GOWN DISPOSABLE) ×8
GRASPER LAPSCPC 5X45 DSP (INSTRUMENTS) ×2 IMPLANT
GRASPER TIP-UP FEN DVNC XI (INSTRUMENTS) ×2 IMPLANT
IRRIGATION STRYKERFLOW (MISCELLANEOUS) IMPLANT
IRRIGATOR STRYKERFLOW (MISCELLANEOUS) ×2
IV NS 1000ML (IV SOLUTION)
IV NS 1000ML BAXH (IV SOLUTION) IMPLANT
KIT PINK PAD W/HEAD ARE REST (MISCELLANEOUS) ×2
KIT PINK PAD W/HEAD ARM REST (MISCELLANEOUS) ×2 IMPLANT
LABEL OR SOLS (LABEL) ×2 IMPLANT
MANIFOLD NEPTUNE II (INSTRUMENTS) ×2 IMPLANT
MESH BIO-A 7X10 SYN MAT (Mesh General) IMPLANT
NDL DRIVE SUT CUT DVNC (INSTRUMENTS) ×2 IMPLANT
NDL HYPO 22X1.5 SAFETY MO (MISCELLANEOUS) ×2 IMPLANT
NEEDLE DRIVE SUT CUT DVNC (INSTRUMENTS) ×2 IMPLANT
NEEDLE HYPO 22X1.5 SAFETY MO (MISCELLANEOUS) ×2 IMPLANT
OBTURATOR OPTICAL STND 8 DVNC (TROCAR) ×2
OBTURATOR OPTICALSTD 8 DVNC (TROCAR) ×2 IMPLANT
PACK LAP CHOLECYSTECTOMY (MISCELLANEOUS) ×2 IMPLANT
PENCIL SMOKE EVACUATOR (MISCELLANEOUS) ×2 IMPLANT
SEAL UNIV 5-12 XI (MISCELLANEOUS) ×6 IMPLANT
SEALER VESSEL EXT DVNC XI (MISCELLANEOUS) ×2 IMPLANT
SOL ELECTROSURG ANTI STICK (MISCELLANEOUS) ×2
SOLUTION ELECTROSURG ANTI STCK (MISCELLANEOUS) ×2 IMPLANT
SPIKE FLUID TRANSFER (MISCELLANEOUS) ×2 IMPLANT
SPONGE T-LAP 18X18 ~~LOC~~+RFID (SPONGE) ×2 IMPLANT
SUT MNCRL 4-0 (SUTURE) ×2
SUT MNCRL 4-0 27XMFL (SUTURE) ×2
SUT SILK 2 0 SH (SUTURE) ×4 IMPLANT
SUT VIC AB 3-0 SH 27 (SUTURE)
SUT VIC AB 3-0 SH 27X BRD (SUTURE) IMPLANT
SUT VICRYL 0 UR6 27IN ABS (SUTURE) ×4 IMPLANT
SUT VLOC 90 S/L VL9 GS22 (SUTURE) ×2 IMPLANT
SUTURE MNCRL 4-0 27XMF (SUTURE) ×2 IMPLANT
SYR 30ML LL (SYRINGE) ×2 IMPLANT
SYS BAG RETRIEVAL 10MM (BASKET)
SYSTEM BAG RETRIEVAL 10MM (BASKET) IMPLANT
TRAP FLUID SMOKE EVACUATOR (MISCELLANEOUS) ×2 IMPLANT
TRAY FOLEY SLVR 16FR LF STAT (SET/KITS/TRAYS/PACK) ×2 IMPLANT
TROCAR Z-THREAD FIOS 5X100MM (TROCAR) ×2 IMPLANT
TUBING EVAC SMOKE HEATED PNEUM (TUBING) ×2 IMPLANT
WATER STERILE IRR 500ML POUR (IV SOLUTION) ×2 IMPLANT

## 2022-12-20 NOTE — Anesthesia Preprocedure Evaluation (Signed)
Anesthesia Evaluation  Patient identified by MRN, date of birth, ID band Patient awake    Reviewed: Allergy & Precautions, NPO status , Patient's Chart, lab work & pertinent test results  History of Anesthesia Complications Negative for: history of anesthetic complications  Airway Mallampati: III  TM Distance: >3 FB Neck ROM: full    Dental  (+) Chipped, Dental Advidsory Given   Pulmonary neg pulmonary ROS, neg shortness of breath, neg COPD, former smoker   Pulmonary exam normal        Cardiovascular negative cardio ROS Normal cardiovascular exam     Neuro/Psych  PSYCHIATRIC DISORDERS Anxiety     negative neurological ROS     GI/Hepatic Neg liver ROS, hiatal hernia,neg GERD  ,,  Endo/Other  negative endocrine ROS    Renal/GU      Musculoskeletal   Abdominal   Peds  Hematology  (+) Blood dyscrasia, anemia   Anesthesia Other Findings Past Medical History: No date: Anxiety No date: B12 deficiency No date: Calculus of gallbladder without cholecystitis without  obstruction No date: GERD (gastroesophageal reflux disease)     Comment:  no meds 06/20/2019: History of 2019 novel coronavirus disease (COVID-19) No date: Intraductal papilloma     Comment:  right breast No date: Low ferritin No date: Numbness and tingling of left side of face No date: Recurrent umbilical hernia  Past Surgical History: 08/12/2022: BREAST BIOPSY; Right     Comment:  u/s guided bx, mass, COIL clip-INTRADUCTAL PAPILLOMA               WITH FOCAL SCLEROSIS 09/02/2022: BREAST BIOPSY; Right     Comment:  stereo bx, asymmetry,X Clip X-CLIP) - PSEUDOANGIOMATOUS               STROMAL HYPERPLASIA, STROMAL FIBROSIS, AND COLUMNAR CELL               CHANGE. - NEGATIVE FOR ATYPIA AND MALIGNANCY 09/02/2022: BREAST BIOPSY; Right     Comment:  u/s bx, mass, RIBBON  NTRADUCTAL PAPILLOMA WITH FOCAL               EPITHELIAL ATYPIA AND SCLEROSIS.  Comment: IHC for CK5/6               and ER was performed on part A. The atypical epithelial               cells are negative for CK5/6 with retained ER expression.              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 09/02/2022: BREAST BIOPSY; Right     Comment:  [place] RT BREAST BX W LOC DEV 1ST LESION IMG BX SPEC [place]               GUIDE 09/02/2022 [facility] 09/02/2022: BREAST BIOPSY; Right     Comment:  MM RT BREAST BX W LOC DEV 1ST LESION IMAGE BX SPEC               STEREO GUIDE 09/02/2022 [facility] 09/19/2022: BREAST BIOPSY; Right     Comment:  MM RT RADIO FREQUENCY TAG LOC MAMMO GUIDE 09/19/2022               [facility] 09/19/2022: BREAST BIOPSY; Right     Comment:  MM RT RADIO FREQUENCY TAG EA ADD LESION LOC MAMMO GUIDE  09/19/2022 [facility] 09/19/2022: BREAST LUMPECTOMY WITH RADIOFREQUENCY TAG IDENTIFICATION;  Right     Comment:  2 savi scout place prior to  surgery 09/27/22 2018: CHOLECYSTECTOMY 2014: HERNIA REPAIR     Comment:  Umbical 02/09/2021: INSERTION OF MESH     Comment:  Procedure: INSERTION OF MESH;  Surgeon: Henrene Dodge,               MD;  Location: [facility];  Service: General;; No date: TUBAL LIGATION     Reproductive/Obstetrics negative OB ROS                              Anesthesia Physical Anesthesia Plan  ASA: 2  Anesthesia Plan: General   Post-op Pain Management:    Induction: Intravenous  PONV Risk Score and Plan: 3 and Midazolam, Dexamethasone and Ondansetron  Airway Management Planned: Oral ETT  Additional Equipment:   Intra-op Plan:   Post-operative Plan: Extubation in OR  Informed Consent: I have reviewed the patients History and Physical, chart, labs and discussed the procedure including the risks, benefits and alternatives for the proposed anesthesia with the patient or authorized representative who has indicated his/her understanding and  acceptance.     Dental Advisory Given  Plan Discussed with: Anesthesiologist, CRNA and Surgeon  Anesthesia Plan Comments: (Patient consented for risks of anesthesia including but not limited to:  - adverse reactions to medications - damage to eyes, teeth, lips or other oral mucosa - nerve damage due to positioning  - sore throat or hoarseness - Damage to heart, brain, nerves, lungs, other parts of body or loss of life  Patient voiced understanding.)        Anesthesia Quick Evaluation

## 2022-12-20 NOTE — Transfer of Care (Signed)
Immediate Anesthesia Transfer of Care Note  Patient: Lauren Boyd  Procedure(s) Performed: XI ROBOTIC ASSISTED PARAESOPHAGEAL HERNIA REPAIR, RNFA to assist INSERTION OF MESH  Patient Location: PACU  Anesthesia Type:General  Level of Consciousness: awake  Airway & Oxygen Therapy: Patient Spontanous Breathing and Patient connected to face mask oxygen  Post-op Assessment: Report given to RN and Post -op Vital signs reviewed and stable  Post vital signs: Reviewed and stable  Last Vitals:  Vitals Value Taken Time  BP 119/83 12/20/22 1330  Temp 36.3 C 12/20/22 1330  Pulse 85 12/20/22 1332  Resp 18 12/20/22 1332  SpO2 100 % 12/20/22 1332  Vitals shown include unfiled device data.  Last Pain:  Vitals:   12/20/22 1330  TempSrc:   PainSc: Asleep         Complications: No notable events documented.

## 2022-12-20 NOTE — Op Note (Signed)
Robotic assisted laparoscopic repair of  paraesophageal  hernia with Bio-A Mesh and Nissen fundoplication  Pre-operative Diagnosis: GERD, hiatal hernia  Post-operative Diagnosis: same  Procedure:  Robotic assisted laparoscopic repair of  paraesophageal  hernia with Bio-A Mesh and Nissen fundoplication  Surgeon: Sterling Big, MD FACS  Assistant:  Sonda Rumble  RNFA.  Required due to the complexity of the case the need for exposure and lack of first assist.  Anesthesia: Gen. with endotracheal tube  Findings: Significant adhesions from the abd wall to the omentum,  Incorporated mesh from prior herniorrhaphy but with significant omental adhesions Small sliding hiatal hernia  Loose wrap 360 degree over 50 FR Bougie  Estimated Blood Loss: 10cc            Complications: none  Procedure Details  The patient was seen again in the Holding Room. The benefits, complications, treatment options, and expected outcomes were discussed with the patient. The risks of bleeding, infection, recurrence of symptoms, failure to resolve symptoms,  esophageal damage, Dysphagia, bowel injury, any of which could require further surgery were reviewed with the patient. The likelihood of improving the patient's symptoms with return to their baseline status is good.  The patient and/or family concurred with the proposed plan, giving informed consent.  The patient was taken to Operating Room, identified  and the procedure verified.  A Time Out was held and the above information confirmed.  Prior to the induction of general anesthesia, antibiotic prophylaxis was administered. VTE prophylaxis was in place. General endotracheal anesthesia was then administered and tolerated well. After the induction, the abdomen was prepped with Chloraprep and draped in the sterile fashion. The patient was positioned in the supine position.   Cut down technique was used to enter the abdominal cavity and a Hasson trochar was placed after  two vicryl stitches were anchored to the fascia. Pneumoperitoneum was then created with CO2 and tolerated well without any adverse changes in the patient's vital signs.  Three 8-mm ports were placed under direct vision. All skin incisions  were infiltrated with a local anesthetic agent before making the incision and placing the trocars. An additional 5 mm regular laparoscopic port was placed to assist with retraction and exposure.   I initially started with lysis of adhesions, omentum was plastered to the upper abd wall and we saw a well incorporated mesh. Adhesions lysed with scissors and cautery where permissible On ce we obtained a good dissecting window we advanced with the operation.. The patient was positioned  in reverse Trendelenburg, robot was brought to the surgical field and docked in the standard fashion.  We made sure all the instrumentation was kept indirect view at all times and that there were no collision between the arms. I scrubbed out and went to the console.   I used a robotic arm to retract the liver, the vessel sealer on my right hand and a forced bipolar grasper on my left hand.  There is along the extra 5 mm port allow me ample exposure and the ability to perform meticulous dissection   We Started dividing the lesser omentum via the pars flaccida.  We Were able to dissect the lesser curvature of the stomach and  dissected the fundus free from the right and left crus.  We circumferentially dissected the GE junction.  The hernia sac was also completely reduced and we were able to bring the stomach into the intra-abdominal position.  Attention then was turned to the greater curvature where the short gastrics  were divided with sealer device.  We were able to identify the left crus and again were able to make sure there was a good circumferential dissection and that the hernia sac was completely excised.  We did perform a good dissection within the mediastinum to allow a complete reduction  of the sac, and to gain esophageal length and a to completely allow an intra-abdominal fundoplication. Using two strips of Bio-A as pledgets we approximated the crus with a 2-0V lock suture. A bio-A 10x7 cm mesh was inserted and secured using Vistaseal.   We Asked anesthesia to place a 50 French bougie and this went easily.  We also observe trajectory of the bougie. 360 degree Nissen fundoplication was created with multiple 2-0 silk sutures and we placed 3 stitches taking some of the esophagus within that bite.  The fundoplication measured approximately 3-1/2 cm and he was floppy. I was very happy with the way the fundoplication laid and the repair of the hernia. I asked aneshtesia to replace OG and instill ICG via the tube. No evidence of leaks or esophageal injuries were observed.   Inspection of the  upper quadrant was performed. No bleeding, bile  Or esophageal injuries leaks, or bowel injuries were noted. Robotic instruments and robotic arms were undocked in the standard fashion. All the needles were removed under direct visualization.   I scrubbed back in.   Pneumoperitoneum was released.  The periumbilical port site was closed with interrumpted 0 Vicryl sutures. 4-0 subcuticular Monocryl was used to close the skin. Liposomal marcaine was injected to all the incisions sites.   Dermabond was  applied.  The patient was then extubated and brought to the recovery room in stable condition. Sponge, lap, and needle counts were correct at closure and at the conclusion of the case.           Sterling Big, MD, FACS

## 2022-12-20 NOTE — Anesthesia Postprocedure Evaluation (Signed)
Anesthesia Post Note  Patient: Lauren Boyd  Procedure(s) Performed: XI ROBOTIC ASSISTED PARAESOPHAGEAL HERNIA REPAIR, RNFA to assist INSERTION OF MESH  Patient location during evaluation: PACU Anesthesia Type: General Level of consciousness: awake and alert Pain management: pain level controlled Vital Signs Assessment: post-procedure vital signs reviewed and stable Respiratory status: spontaneous breathing, nonlabored ventilation, respiratory function stable and patient connected to nasal cannula oxygen Cardiovascular status: blood pressure returned to baseline and stable Postop Assessment: no apparent nausea or vomiting Anesthetic complications: no   No notable events documented.   Last Vitals:  Vitals:   12/20/22 1430 12/20/22 1450  BP: 136/86 132/84  Pulse: 82 84  Resp: 14 16  Temp: 36.7 C 36.7 C  SpO2: 97% 98%    Last Pain:  Vitals:   12/20/22 1420  TempSrc:   PainSc: Asleep                 Louie Boston

## 2022-12-20 NOTE — Interval H&P Note (Signed)
History and Physical Interval Note:  12/20/2022 10:10 AM  Lauren Boyd  has presented today for surgery, with the diagnosis of hiatal hernia.  The various methods of treatment have been discussed with the patient and family. After consideration of risks, benefits and other options for treatment, the patient has consented to  Procedure(s): XI ROBOTIC ASSISTED PARAESOPHAGEAL HERNIA REPAIR, RNFA to assist (N/A) as a surgical intervention.  The patient's history has been reviewed, patient examined, no change in status, stable for surgery.  I have reviewed the patient's chart and labs.  Questions were answered to the patient's satisfaction.     Lauren Boyd

## 2022-12-20 NOTE — Anesthesia Procedure Notes (Signed)
Procedure Name: Intubation Date/Time: 12/20/2022 11:04 AM  Performed by: Elisabeth Pigeon, CRNAPre-anesthesia Checklist: Patient identified, Patient being monitored, Timeout performed, Emergency Drugs available and Suction available Patient Re-evaluated:Patient Re-evaluated prior to induction Oxygen Delivery Method: Circle system utilized Preoxygenation: Pre-oxygenation with 100% oxygen Induction Type: IV induction Ventilation: Mask ventilation without difficulty Laryngoscope Size: 3 and McGraph Grade View: Grade I Tube type: Oral Tube size: 7.0 mm Number of attempts: 1 Airway Equipment and Method: Stylet Placement Confirmation: ETT inserted through vocal cords under direct vision, positive ETCO2 and breath sounds checked- equal and bilateral Secured at: 21 cm Tube secured with: Tape Dental Injury: Teeth and Oropharynx as per pre-operative assessment

## 2022-12-21 ENCOUNTER — Encounter: Payer: Self-pay | Admitting: Surgery

## 2022-12-21 DIAGNOSIS — K449 Diaphragmatic hernia without obstruction or gangrene: Secondary | ICD-10-CM | POA: Diagnosis not present

## 2022-12-21 LAB — CBC
HCT: 33.5 % — ABNORMAL LOW (ref 36.0–46.0)
Hemoglobin: 11.2 g/dL — ABNORMAL LOW (ref 12.0–15.0)
MCH: 28.3 pg (ref 26.0–34.0)
MCHC: 33.4 g/dL (ref 30.0–36.0)
MCV: 84.6 fL (ref 80.0–100.0)
Platelets: 213 10*3/uL (ref 150–400)
RBC: 3.96 MIL/uL (ref 3.87–5.11)
RDW: 14.1 % (ref 11.5–15.5)
WBC: 11.4 10*3/uL — ABNORMAL HIGH (ref 4.0–10.5)
nRBC: 0 % (ref 0.0–0.2)

## 2022-12-21 LAB — BASIC METABOLIC PANEL
Anion gap: 11 (ref 5–15)
BUN: 12 mg/dL (ref 6–20)
CO2: 22 mmol/L (ref 22–32)
Calcium: 8.2 mg/dL — ABNORMAL LOW (ref 8.9–10.3)
Chloride: 104 mmol/L (ref 98–111)
Creatinine, Ser: 0.83 mg/dL (ref 0.44–1.00)
GFR, Estimated: >60 mL/min (ref >60)
Glucose, Bld: 119 mg/dL — ABNORMAL HIGH (ref 70–99)
Potassium: 3.9 mmol/L (ref 3.5–5.1)
Sodium: 137 mmol/L (ref 135–145)

## 2022-12-21 LAB — HIV ANTIBODY (ROUTINE TESTING W REFLEX): HIV Screen 4th Generation wRfx: NONREACTIVE

## 2022-12-21 LAB — HEMOGLOBIN A1C
Hgb A1c MFr Bld: 5.1 % (ref 4.8–5.6)
Mean Plasma Glucose: 99.67 mg/dL

## 2022-12-21 MED ORDER — ACETAMINOPHEN 500 MG PO TABS
ORAL_TABLET | ORAL | Status: AC
  Filled 2022-12-21: qty 2

## 2022-12-21 MED ORDER — KETOROLAC TROMETHAMINE 30 MG/ML IJ SOLN
INTRAMUSCULAR | Status: AC
  Filled 2022-12-21: qty 1

## 2022-12-21 MED ORDER — OXYCODONE HCL 5 MG PO TABS: 5.0000 mg | ORAL_TABLET | Freq: Four times a day (QID) | ORAL | 0 refills | Status: DC | PRN

## 2022-12-21 MED ORDER — OXYCODONE HCL 5 MG PO TABS
ORAL_TABLET | ORAL | Status: AC
  Filled 2022-12-21: qty 2

## 2022-12-21 MED ORDER — CEFAZOLIN SODIUM-DEXTROSE 2-4 GM/100ML-% IV SOLN
INTRAVENOUS | Status: AC
  Filled 2022-12-21: qty 100

## 2022-12-21 MED ORDER — ENOXAPARIN SODIUM 40 MG/0.4ML IJ SOSY
PREFILLED_SYRINGE | INTRAMUSCULAR | Status: AC
  Filled 2022-12-21: qty 0.4

## 2022-12-21 NOTE — Progress Notes (Signed)
DISCHARGE NOTE:  Pt given discharge instructions and verbalized understanding. Pt wheeled to the car, family providing transportation.

## 2022-12-21 NOTE — Discharge Instructions (Signed)
In addition to included general post-operative instructions,  Diet: Follow Nissen diet restrictions x4 weeks; Hand out given  Activity: No heavy lifting >20 pounds (children, pets, laundry, garbage) or strenuous activity for 4 weeks, but light activity and walking are encouraged. Do not drive or drink alcohol if taking narcotic pain medications or having pain that might distract from driving.  Wound care: 2 days after surgery (10/10), you may shower/get incision wet with soapy water and pat dry (do not rub incisions), but no baths or submerging incision underwater until follow-up.   Medications: Resume all home medications. For mild to moderate pain: acetaminophen (Tylenol) or ibuprofen/naproxen (if no kidney disease). Combining Tylenol with alcohol can substantially increase your risk of causing liver disease. Narcotic pain medications, if prescribed, can be used for severe pain, though may cause nausea, constipation, and drowsiness. Do not combine Tylenol and Percocet (or similar) within a 6 hour period as Percocet (and similar) contain(s) Tylenol. If you do not need the narcotic pain medication, you do not need to fill the prescription.  Call office [phone] / [phone]) at any time if any questions, worsening pain, fevers/chills, bleeding, drainage from incision site, or other concerns.

## 2022-12-21 NOTE — Discharge Summary (Signed)
[facility] SURGICAL DISCHARGE SUMMARY  Patient ID: Lauren Boyd MRN: [id number] DOB/AGE: 1980/07/04 42 y.o.  Admit date: 12/20/2022 Discharge date: 12/21/2022  Discharge Diagnoses Patient Active Problem List   Diagnosis Date Noted   Hiatal hernia 12/20/2022   S/P repair of paraesophageal hernia 12/20/2022    Consultants None  Procedures 12/20/2022:  Robotic assisted laparoscopic paraesophageal hernia repair Nissen Fundoplication   HPI: Lauren Boyd is a 42 y.o. female with history of significant reflux symptoms who presents to [facility] on 10/08 for scheduled surgery  Hospital Course: Informed consent was obtained and documented, and patient underwent uneventful robotic assisted laparoscopic paraesophageal hernia repair and Nissen fundoplication (Dr Everlene Farrier, 12/20/2022).  Post-operatively, patient did well and advancement of patient's diet and ambulation were well-tolerated. The remainder of patient's hospital course was essentially unremarkable, and discharge planning was initiated accordingly with patient safely able to be discharged home with appropriate discharge instructions, pain control, and outpatient follow-up after all of her questions were answered to her expressed satisfaction.   Discharge Condition: Good    Physical Examination:  Constitutional: Well appearing female, NAD Pulmonary: Normal effort, no respiratory distress  Gastrointestinal: Soft, incisional soreness, non-distended, no rebound/guarding Skin: Laparoscopic Incisions are CDI with dermabond, no erythema or drainage    Allergies as of 12/21/2022       Reactions   Requip [ropinirole]    Headache/ migraine   Sertraline    Talking to self and "doing things I didn't realize I was doing"   Vyvanse [lisdexamfetamine]    Chewing lips and crazy thoughts        Medication List     TAKE these medications    acetaminophen 500 MG tablet Commonly known as: TYLENOL Take 2 tablets  (1,000 mg total) by mouth every 6 (six) hours as needed for mild pain.   cyanocobalamin 1000 MCG tablet Take 1,000 mcg by mouth daily.   escitalopram 20 MG tablet Commonly known as: LEXAPRO Take 20 mg by mouth every morning. [name]   ferrous sulfate 324 (65 Fe) MG Tbec Take 1 tablet by mouth daily.   ibuprofen 800 MG tablet Commonly known as: ADVIL Take 1 tablet (800 mg total) by mouth every 8 (eight) hours as needed for moderate pain.   metFORMIN 500 MG tablet Commonly known as: GLUCOPHAGE Take 500 mg by mouth daily with breakfast. Pt takes for appetite suppressant-Pt is NOT diabetic   multivitamin tablet Take 1 tablet by mouth daily.   oxyCODONE 5 MG immediate release tablet Commonly known as: Oxy IR/ROXICODONE Take 1 tablet (5 mg total) by mouth every 6 (six) hours as needed for severe pain or breakthrough pain.   tamoxifen 20 MG tablet Commonly known as: NOLVADEX Take 1 tablet (20 mg total) by mouth daily. What changed: when to take this   VITAMIN D PO Take 1 capsule by mouth 3 (three) times a week.          Follow-up Information     Leafy Ro, MD. Go on 01/09/2023.   Specialty: General Surgery Why: Go to appointment on 10/28 at 945 AM Contact information: [address] [place] [address] [phone]                  Time spent on discharge management including discussion of hospital course, clinical condition, outpatient instructions, prescriptions, and follow up with the patient and members of the medical team: >30 minutes  -- Lynden Oxford , PA-C [facility]  12/21/2022, 8:45  AM [phone] M-F: 7am - 4pm

## 2022-12-21 NOTE — Plan of Care (Signed)
  Problem: Health Behavior/Discharge Planning: Goal: Ability to manage health-related needs will improve Outcome: Progressing   Problem: Tissue Perfusion: Goal: Adequacy of tissue perfusion will improve Outcome: Progressing

## 2022-12-21 NOTE — Plan of Care (Signed)
  Problem: Nutritional: Goal: Maintenance of adequate nutrition will improve Outcome: Progressing   Problem: Skin Integrity: Goal: Risk for impaired skin integrity will decrease Outcome: Progressing

## 2023-01-05 DIAGNOSIS — Z9049 Acquired absence of other specified parts of digestive tract: Secondary | ICD-10-CM | POA: Diagnosis not present

## 2023-01-05 DIAGNOSIS — R1030 Lower abdominal pain, unspecified: Secondary | ICD-10-CM | POA: Diagnosis not present

## 2023-01-05 DIAGNOSIS — N3000 Acute cystitis without hematuria: Secondary | ICD-10-CM | POA: Diagnosis not present

## 2023-01-05 DIAGNOSIS — G8918 Other acute postprocedural pain: Secondary | ICD-10-CM | POA: Diagnosis not present

## 2023-01-05 DIAGNOSIS — K429 Umbilical hernia without obstruction or gangrene: Secondary | ICD-10-CM | POA: Diagnosis not present

## 2023-01-09 ENCOUNTER — Encounter: Payer: Self-pay | Admitting: Family Medicine

## 2023-01-09 ENCOUNTER — Ambulatory Visit (INDEPENDENT_AMBULATORY_CARE_PROVIDER_SITE_OTHER): Payer: Medicaid Other | Admitting: Surgery

## 2023-01-09 ENCOUNTER — Encounter: Payer: Self-pay | Admitting: Surgery

## 2023-01-09 VITALS — BP 116/77 | HR 73 | Temp 98.0°F | Ht 64.0 in | Wt 178.0 lb

## 2023-01-09 DIAGNOSIS — Z09 Encounter for follow-up examination after completed treatment for conditions other than malignant neoplasm: Secondary | ICD-10-CM

## 2023-01-09 DIAGNOSIS — K449 Diaphragmatic hernia without obstruction or gangrene: Secondary | ICD-10-CM

## 2023-01-09 NOTE — Progress Notes (Signed)
Lauren Boyd is 2 and half weeks from robotic hiatal hernia repair with Nissen fundoplication.  She is feeling very well.  Reports no nausea at all after procedure. Episgastric pain has also improved . She is extremely happy with results and outcomes.  She is tolerating diet.  No fevers no chills.  Well  PE NAD Abd: soft, patient is healing well without infection or complications.  A/P Doing very well May slowy advance diet in 1-2 weeks No complications RTC 3 months

## 2023-01-09 NOTE — Patient Instructions (Addendum)
Follow up here in 3 months.   GENERAL POST-OPERATIVE PATIENT INSTRUCTIONS   WOUND CARE INSTRUCTIONS:  Keep a dry clean dressing on the wound if there is drainage. The initial bandage may be removed after 24 hours.  Once the wound has quit draining you may leave it open to air.  If clothing rubs against the wound or causes irritation and the wound is not draining you may cover it with a dry dressing during the daytime.  Try to keep the wound dry and avoid ointments on the wound unless directed to do so.  If the wound becomes bright red and painful or starts to drain infected material that is not clear, please contact your physician immediately.  If the wound is mildly pink and has a thick firm ridge underneath it, this is normal, and is referred to as a healing ridge.  This will resolve over the next 4-6 weeks.  BATHING: You may shower if you have been informed of this by your surgeon. However, Please do not submerge in a tub, hot tub, or pool until incisions are completely sealed or have been told by your surgeon that you may do so.  DIET:  You may eat any foods that you can tolerate.  It is a good idea to eat a high fiber diet and take in plenty of fluids to prevent constipation.  If you do become constipated you may want to take a mild laxative or take ducolax tablets on a daily basis until your bowel habits are regular.  Constipation can be very uncomfortable, along with straining, after recent surgery.  ACTIVITY:  You are encouraged to cough and deep breath or use your incentive spirometer if you were given one, every 15-30 minutes when awake.  This will help prevent respiratory complications and low grade fevers post-operatively if you had a general anesthetic.  You may want to hug a pillow when coughing and sneezing to add additional support to the surgical area, if you had abdominal or chest surgery, which will decrease pain during these times.  You are encouraged to walk and engage in light  activity for the next two weeks.  You should not lift more than 20 pounds for 6 weeks total after surgery as it could put you at increased risk for complications.  Twenty pounds is roughly equivalent to a plastic bag of groceries. At that time- Listen to your body when lifting, if you have pain when lifting, stop and then try again in a few days. Soreness after doing exercises or activities of daily living is normal as you get back in to your normal routine.  MEDICATIONS:  Try to take narcotic medications and anti-inflammatory medications, such as tylenol, ibuprofen, naprosyn, etc., with food.  This will minimize stomach upset from the medication.  Should you develop nausea and vomiting from the pain medication, or develop a rash, please discontinue the medication and contact your physician.  You should not drive, make important decisions, or operate machinery when taking narcotic pain medication.  SUNBLOCK Use sun block to incision area over the next year if this area will be exposed to sun. This helps decrease scarring and will allow you avoid a permanent darkened area over your incision.  QUESTIONS:  Please feel free to call our office if you have any questions, and we will be glad to assist you. [phone]

## 2023-01-19 ENCOUNTER — Inpatient Hospital Stay (HOSPITAL_BASED_OUTPATIENT_CLINIC_OR_DEPARTMENT_OTHER): Payer: Medicaid Other | Admitting: Internal Medicine

## 2023-01-19 ENCOUNTER — Inpatient Hospital Stay: Payer: Medicaid Other | Attending: Internal Medicine

## 2023-01-19 VITALS — BP 104/80 | HR 87 | Temp 98.4°F | Wt 183.0 lb

## 2023-01-19 DIAGNOSIS — N6091 Unspecified benign mammary dysplasia of right breast: Secondary | ICD-10-CM | POA: Insufficient documentation

## 2023-01-19 DIAGNOSIS — Z79810 Long term (current) use of selective estrogen receptor modulators (SERMs): Secondary | ICD-10-CM | POA: Insufficient documentation

## 2023-01-19 DIAGNOSIS — Z803 Family history of malignant neoplasm of breast: Secondary | ICD-10-CM | POA: Diagnosis not present

## 2023-01-19 DIAGNOSIS — E538 Deficiency of other specified B group vitamins: Secondary | ICD-10-CM | POA: Insufficient documentation

## 2023-01-19 DIAGNOSIS — Z87891 Personal history of nicotine dependence: Secondary | ICD-10-CM | POA: Diagnosis not present

## 2023-01-19 LAB — CBC WITH DIFFERENTIAL/PLATELET
Abs Immature Granulocytes: 0.03 10*3/uL (ref 0.00–0.07)
Basophils Absolute: 0 10*3/uL (ref 0.0–0.1)
Basophils Relative: 1 %
Eosinophils Absolute: 0.3 10*3/uL (ref 0.0–0.5)
Eosinophils Relative: 4 %
HCT: 36.4 % (ref 36.0–46.0)
Hemoglobin: 11.8 g/dL — ABNORMAL LOW (ref 12.0–15.0)
Immature Granulocytes: 0 %
Lymphocytes Relative: 28 %
Lymphs Abs: 1.9 10*3/uL (ref 0.7–4.0)
MCH: 27.9 pg (ref 26.0–34.0)
MCHC: 32.4 g/dL (ref 30.0–36.0)
MCV: 86.1 fL (ref 80.0–100.0)
Monocytes Absolute: 0.6 10*3/uL (ref 0.1–1.0)
Monocytes Relative: 8 %
Neutro Abs: 4.1 10*3/uL (ref 1.7–7.7)
Neutrophils Relative %: 59 %
Platelets: 238 10*3/uL (ref 150–400)
RBC: 4.23 MIL/uL (ref 3.87–5.11)
RDW: 13.2 % (ref 11.5–15.5)
WBC: 6.9 10*3/uL (ref 4.0–10.5)
nRBC: 0 % (ref 0.0–0.2)

## 2023-01-19 LAB — COMPREHENSIVE METABOLIC PANEL
ALT: 18 U/L (ref 0–44)
AST: 17 U/L (ref 15–41)
Albumin: 3.8 g/dL (ref 3.5–5.0)
Alkaline Phosphatase: 54 U/L (ref 38–126)
Anion gap: 8 (ref 5–15)
BUN: 14 mg/dL (ref 6–20)
CO2: 26 mmol/L (ref 22–32)
Calcium: 9 mg/dL (ref 8.9–10.3)
Chloride: 106 mmol/L (ref 98–111)
Creatinine, Ser: 0.88 mg/dL (ref 0.44–1.00)
GFR, Estimated: >60 mL/min (ref >60)
Glucose, Bld: 104 mg/dL — ABNORMAL HIGH (ref 70–99)
Potassium: 4.4 mmol/L (ref 3.5–5.1)
Sodium: 140 mmol/L (ref 135–145)
Total Bilirubin: 0.3 mg/dL (ref <1.2)
Total Protein: 6.7 g/dL (ref 6.5–8.1)

## 2023-01-19 NOTE — Progress Notes (Signed)
[facility] CONSULT NOTE  Patient Care Team: [name], MD as PCP - General (Family Medicine) Naoma Diener, NP as Nurse Practitioner (Neurology) Michaelyn Barter, MD as Consulting Physician (Oncology)  REASON FOR REFFERAL: Right breast ADH  CANCER STAGING   Cancer Staging  No matching staging information was found for the patient.   ASSESSMENT & PLAN:  Lauren Boyd 42 y.o. female with pmh of anxiety, B12 deficiency, GERD was referred to medical oncology to discuss about chemoprevention for right breast ADH.  # Right breast ADH - s/p right breast 7:00 5 cm from nipple US guided biopsy showed intraductal papilloma with focal epithelial atypia and sclerosis.  Consistent with atypical ductal hyperplasia.  - s/p right breast lumpectomy with Dr. Aleen Campi on 09/27/2022.  Showed intraductal papilloma.  No further evidence of atypia or malignancy.  Path report below.  - ADH can increase the risk of having invasive breast cancer in the future.  Based on NCI breast cancer risk assessment she has lifetime risk of developing invasive breast cancer is 19.9% versus general population at 12.2%.  Discussed about role of tamoxifen for chemoprevention 20 mg daily for 5 years which can decrease the risk of developing invasive breast cancer by about 30% with no difference in the overall survival.  Side effects such as hot flashes, mood changes, increased cholesterol level, increase in the risk of endometrial cancer and thromboembolic event was discussed with the patient.   -Started tamoxifen 20 mg daily on 10/19/2022.  Patient tolerating well.  Plan is to continue for 5 years.  We discussed about signs and symptoms to be aware about such as leg swelling, calf tenderness, pleuritic chest pain, shortness of breath.  Patient to follow-up with her OB for annual pelvic exam.  Any abnormal vaginal bleeding to be reported.  -Will schedule for next mammogram in May 2025   Orders Placed This  Encounter  Procedures   MM DIAG BREAST TOMO BILATERAL    Standing Status:   Future    Standing Expiration Date:   01/19/2024    Order Specific Question:   Reason for Exam (SYMPTOM  OR DIAGNOSIS REQUIRED)    Answer:   follow up breast ADH    Order Specific Question:   Is the patient pregnant?    Answer:   No    Order Specific Question:   Preferred imaging location?    Answer:   [facility]   RTC in 6 months for MD visit, labs, discuss mammo  The total time spent in the appointment was 55 minutes encounter with patients including review of chart and various tests results, discussions about plan of care and coordination of care plan   All questions were answered. The patient knows to call the clinic with any problems, questions or concerns. No barriers to learning was detected.  Michaelyn Barter, MD 11/7/20241:59 PM   HISTORY OF PRESENTING ILLNESS:  Lauren Boyd 42 y.o. female with pmh of anxiety, B12 deficiency, GERD was referred to medical oncology to discuss about chemoprevention for right breast ADH.  Patient had screening mammogram on 08/05/2022 which showed 6 mm mass in the lower outer retroareolar right breast, additional 6 mm in the lower central right breast, 13 mm focal asymmetry in the upper outer right breast.  Left breast no concerns.  Ultrasound showed at 7:00 and retroareolar position intraductal mass measuring 12 x 4 x 4.  At 7:00 5 cm from nipple oval mass measures 7 x 3 x 4 mm.  And at 10:00 10 cm from nipple demonstrates normal fibroglandular tissue.  Breast biopsy pathology DIAGNOSIS:  A. BREAST, RIGHT 7:00 5 CMFN; ULTRASOUND-GUIDED BIOPSY:  - INTRADUCTAL PAPILLOMA WITH FOCAL EPITHELIAL ATYPIA AND SCLEROSIS.   B.  BREAST ASYMMETRY, RIGHT UPPER OUTER STEREOTACTIC BIOPSY:  - PSEUDOANGIOMATOUS STROMAL HYPERPLASIA, STROMAL FIBROSIS, AND COLUMNAR  CELL CHANGE.  - NEGATIVE FOR ATYPIA AND MALIGNANCY.   Comment:  IHC for CK5/6 and ER was performed on part A. The  atypical epithelial  cells are negative for CK5/6 with retained ER expression. 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    s/p right breast lumpectomy by Dr. Aleen Campi on 09/27/2022.   Interval history Patient seen today as follow-up for labs and to assess tamoxifen toxicity for ADH She is tolerating tamoxifen remarkably well.  Denies any issues at all.  Had recent hernia repair surgery and is recovering well from it.  Denies any leg swelling or shortness of breath.  Denies any abnormal vaginal bleeding.  Reports recently had ovarian cyst rupture and has some mild pelvic pain and supposed to follow-up with her OB.  I have reviewed her chart and materials related to her cancer extensively and collaborated history with the patient. Summary of oncologic history is as follows: Oncology History   No history exists.    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 - NEGATIVE FOR ATYPIA AND MALIGNANCY   BREAST BIOPSY Right 09/02/2022   u/s bx, mass, RIBBON  NTRADUCTAL PAPILLOMA WITH FOCAL  EPITHELIAL ATYPIA AND SCLEROSIS. Comment: IHC for CK5/6 and ER was performed on part A. The atypical epithelial cells are negative for CK5/6 with retained ER expression. 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   BREAST BIOPSY Right 09/02/2022   [place] RT BREAST BX W LOC DEV 1ST LESION IMG BX SPEC US GUIDE 09/02/2022 [facility]   BREAST BIOPSY Right 09/02/2022   MM RT BREAST BX W LOC DEV 1ST LESION IMAGE BX SPEC STEREO GUIDE 09/02/2022 [facility]   BREAST BIOPSY Right 09/19/2022   MM RT RADIO FREQUENCY TAG LOC MAMMO GUIDE 09/19/2022 [facility]   BREAST BIOPSY Right 09/19/2022   MM RT RADIO FREQUENCY TAG EA ADD LESION LOC MAMMO GUIDE 09/19/2022 [facility]   BREAST LUMPECTOMY WITH RADIOFREQUENCY TAG IDENTIFICATION Right 09/19/2022   2 savi scout place prior to  surgery 09/27/22   BREAST LUMPECTOMY WITH RADIOFREQUENCY TAG IDENTIFICATION Right 09/27/2022   Procedure: BREAST LUMPECTOMY WITH RADIOFREQUENCY TAG IDENTIFICATION;  Surgeon: Henrene Dodge, MD;  Location: [facility];  Service: General;  Laterality: Right;  CHOLECYSTECTOMY  2018   HERNIA REPAIR  2014   Umbical   INSERTION OF MESH  02/09/2021   Procedure: INSERTION OF MESH;  Surgeon: Henrene Dodge, MD;  Location: [facility];  Service: General;;   INSERTION OF MESH  12/20/2022   Procedure: INSERTION OF MESH;  Surgeon: Leafy Ro, MD;  Location: [facility];  Service: General;;   TUBAL LIGATION     XI ROBOTIC ASSISTED PARAESOPHAGEAL HERNIA REPAIR N/A 12/20/2022   Procedure: XI ROBOTIC ASSISTED PARAESOPHAGEAL HERNIA REPAIR, RNFA to assist;  Surgeon: Leafy Ro, MD;  Location: [facility];  Service: General;  Laterality: N/A;    SOCIAL HISTORY: Social History   Socioeconomic History   Marital status: Married    Spouse name: Ann   Number of children: 1   Years of education: Not on file   Highest education level: Not on file  Occupational History   Not on file  Tobacco Use   Smoking status: Former     Current packs/day: 0.00    Types: Cigarettes    Quit date: 2010    Years since quitting: 14.8    Passive exposure: Past   Smokeless tobacco: Never  Vaping Use   Vaping status: Never Used  Substance and Sexual Activity   Alcohol use: Yes    Comment: occasional   Drug use: Never   Sexual activity: Yes    Birth control/protection: None, Surgical    Comment: Tubal ligation  Other Topics Concern   Not on file  Social History Narrative   Not on file   Social Determinants of Health   Financial Resource Strain: Not on file  Food Insecurity: No Food Insecurity (12/20/2022)   Hunger Vital Sign    Worried About Running Out of Food in the Last Year: Never true    Ran Out of Food in the Last Year: Never true  Transportation Needs: No Transportation Needs (12/20/2022)   PRAPARE - Administrator, Civil Service (Medical): No    Lack of Transportation (Non-Medical): No  Physical Activity: Not on file  Stress: Not on file  Social Connections: Not on file  Intimate Partner Violence: Not At Risk (12/20/2022)   Humiliation, Afraid, Rape, and Kick questionnaire    Fear of Current or Ex-Partner: No    Emotionally Abused: No    Physically Abused: No    Sexually Abused: No    FAMILY HISTORY: Family History  Problem Relation Age of Onset   Healthy Mother    Healthy Father    Healthy Brother    Breast cancer Maternal Grandmother 59    ALLERGIES:  is allergic to requip [ropinirole], sertraline, and vyvanse [lisdexamfetamine].  MEDICATIONS:  Current Outpatient Medications  Medication Sig Dispense Refill   acetaminophen (TYLENOL) 500 MG tablet Take 2 tablets (1,000 mg total) by mouth every 6 (six) hours as needed for mild pain.     cyanocobalamin 1000 MCG tablet Take 1,000 mcg by mouth daily.     escitalopram (LEXAPRO) 20 MG tablet Take 20 mg by mouth every morning. [name]     ferrous sulfate 324 (65 Fe) MG TBEC Take 1 tablet by mouth daily.     ibuprofen (ADVIL) 800  MG tablet Take 1 tablet (800 mg total) by mouth every 8 (eight) hours as needed for moderate pain. 60 tablet 1   metFORMIN (GLUCOPHAGE) 500 MG tablet Take 500 mg by mouth daily with breakfast. Pt takes for appetite suppressant-Pt is NOT diabetic  Multiple Vitamin (MULTIVITAMIN) tablet Take 1 tablet by mouth daily.     VITAMIN D PO Take 1 capsule by mouth 3 (three) times a week.     No current facility-administered medications for this visit.    REVIEW OF SYSTEMS:   Pertinent information mentioned in HPI All other systems were reviewed with the patient and are negative.  PHYSICAL EXAMINATION: ECOG PERFORMANCE STATUS: 0 - Asymptomatic  Vitals:   01/19/23 1344  BP: 104/80  Pulse: 87  Temp: 98.4 F (36.9 C)  SpO2: 99%    Filed Weights   01/19/23 1344  Weight: 183 lb (83 kg)     GENERAL:alert, no distress and comfortable SKIN: skin color, texture, turgor are normal, no rashes or significant lesions EYES: normal, conjunctiva are pink and non-injected, sclera clear OROPHARYNX:no exudate, no erythema and lips, buccal mucosa, and tongue normal  NECK: supple, thyroid normal size, non-tender, without nodularity LYMPH:  no palpable lymphadenopathy in the cervical, axillary or inguinal LUNGS: clear to auscultation and percussion with normal breathing effort HEART: regular rate & rhythm and no murmurs and no lower extremity edema ABDOMEN:abdomen soft, non-tender and normal bowel sounds Musculoskeletal:no cyanosis of digits and no clubbing  PSYCH: alert & oriented x 3 with fluent speech NEURO: no focal motor/sensory deficits  LABORATORY DATA:  I have reviewed the data as listed Lab Results  Component Value Date   WBC 6.9 01/19/2023   HGB 11.8 (L) 01/19/2023   HCT 36.4 01/19/2023   MCV 86.1 01/19/2023   PLT 238 01/19/2023   Recent Labs    12/21/22 0609 01/19/23 1330  NA 137 140  K 3.9 4.4  CL 104 106  CO2 22 26  GLUCOSE 119* 104*  BUN 12 14  CREATININE 0.83 0.88   CALCIUM 8.2* 9.0  GFRNONAA >60 >60  PROT  --  6.7  ALBUMIN  --  3.8  AST  --  17  ALT  --  18  ALKPHOS  --  54  BILITOT  --  0.3    RADIOGRAPHIC STUDIES: I have personally reviewed the radiological images as listed and agreed with the findings in the report. No results found.

## 2023-02-13 DIAGNOSIS — F331 Major depressive disorder, recurrent, moderate: Secondary | ICD-10-CM | POA: Diagnosis not present

## 2023-02-13 DIAGNOSIS — F4312 Post-traumatic stress disorder, chronic: Secondary | ICD-10-CM | POA: Diagnosis not present

## 2023-02-13 DIAGNOSIS — F411 Generalized anxiety disorder: Secondary | ICD-10-CM | POA: Diagnosis not present

## 2023-02-13 DIAGNOSIS — F329 Major depressive disorder, single episode, unspecified: Secondary | ICD-10-CM | POA: Diagnosis not present

## 2023-04-10 ENCOUNTER — Ambulatory Visit: Payer: Medicaid Other | Admitting: Surgery

## 2023-04-13 ENCOUNTER — Other Ambulatory Visit: Payer: Self-pay | Admitting: Internal Medicine

## 2023-04-17 DIAGNOSIS — Z6831 Body mass index (BMI) 31.0-31.9, adult: Secondary | ICD-10-CM | POA: Diagnosis not present

## 2023-04-17 DIAGNOSIS — Z79810 Long term (current) use of selective estrogen receptor modulators (SERMs): Secondary | ICD-10-CM | POA: Diagnosis not present

## 2023-04-17 DIAGNOSIS — N83209 Unspecified ovarian cyst, unspecified side: Secondary | ICD-10-CM | POA: Diagnosis not present

## 2023-04-18 DIAGNOSIS — F419 Anxiety disorder, unspecified: Secondary | ICD-10-CM | POA: Diagnosis not present

## 2023-04-18 DIAGNOSIS — Z853 Personal history of malignant neoplasm of breast: Secondary | ICD-10-CM | POA: Diagnosis not present

## 2023-04-18 DIAGNOSIS — F32A Depression, unspecified: Secondary | ICD-10-CM | POA: Diagnosis not present

## 2023-04-18 DIAGNOSIS — E538 Deficiency of other specified B group vitamins: Secondary | ICD-10-CM | POA: Diagnosis not present

## 2023-04-18 DIAGNOSIS — G4700 Insomnia, unspecified: Secondary | ICD-10-CM | POA: Diagnosis not present

## 2023-04-18 DIAGNOSIS — E559 Vitamin D deficiency, unspecified: Secondary | ICD-10-CM | POA: Diagnosis not present

## 2023-04-19 DIAGNOSIS — R9389 Abnormal findings on diagnostic imaging of other specified body structures: Secondary | ICD-10-CM | POA: Diagnosis not present

## 2023-05-15 DIAGNOSIS — D249 Benign neoplasm of unspecified breast: Secondary | ICD-10-CM | POA: Diagnosis not present

## 2023-05-15 DIAGNOSIS — N6099 Unspecified benign mammary dysplasia of unspecified breast: Secondary | ICD-10-CM | POA: Diagnosis not present

## 2023-05-25 DIAGNOSIS — Z6832 Body mass index (BMI) 32.0-32.9, adult: Secondary | ICD-10-CM | POA: Diagnosis not present

## 2023-05-25 DIAGNOSIS — E669 Obesity, unspecified: Secondary | ICD-10-CM | POA: Diagnosis not present

## 2023-05-30 DIAGNOSIS — N840 Polyp of corpus uteri: Secondary | ICD-10-CM | POA: Diagnosis not present

## 2023-05-30 DIAGNOSIS — R102 Pelvic and perineal pain: Secondary | ICD-10-CM | POA: Diagnosis not present

## 2023-05-30 DIAGNOSIS — N946 Dysmenorrhea, unspecified: Secondary | ICD-10-CM | POA: Diagnosis not present

## 2023-08-08 ENCOUNTER — Inpatient Hospital Stay: Admission: RE | Admit: 2023-08-08 | Payer: Medicaid Other | Source: Ambulatory Visit

## 2023-08-10 DIAGNOSIS — N939 Abnormal uterine and vaginal bleeding, unspecified: Secondary | ICD-10-CM | POA: Diagnosis not present

## 2023-08-11 DIAGNOSIS — A09 Infectious gastroenteritis and colitis, unspecified: Secondary | ICD-10-CM | POA: Diagnosis not present

## 2023-08-14 ENCOUNTER — Other Ambulatory Visit: Payer: Self-pay

## 2023-08-14 DIAGNOSIS — N6091 Unspecified benign mammary dysplasia of right breast: Secondary | ICD-10-CM

## 2023-08-15 ENCOUNTER — Other Ambulatory Visit: Payer: Medicaid Other

## 2023-08-15 ENCOUNTER — Encounter: Payer: Self-pay | Admitting: Oncology

## 2023-08-15 ENCOUNTER — Ambulatory Visit: Payer: Medicaid Other | Admitting: Internal Medicine

## 2023-08-15 ENCOUNTER — Inpatient Hospital Stay: Admitting: Oncology

## 2023-08-15 ENCOUNTER — Inpatient Hospital Stay: Attending: Oncology

## 2023-08-15 DIAGNOSIS — N6091 Unspecified benign mammary dysplasia of right breast: Secondary | ICD-10-CM

## 2023-08-21 DIAGNOSIS — M722 Plantar fascial fibromatosis: Secondary | ICD-10-CM | POA: Diagnosis not present

## 2023-08-22 DIAGNOSIS — N939 Abnormal uterine and vaginal bleeding, unspecified: Secondary | ICD-10-CM | POA: Diagnosis not present

## 2023-12-13 ENCOUNTER — Other Ambulatory Visit: Payer: Self-pay | Admitting: Internal Medicine

## 2023-12-13 DIAGNOSIS — N6091 Unspecified benign mammary dysplasia of right breast: Secondary | ICD-10-CM
